# Patient Record
Sex: Female | Born: 1945
Health system: Southern US, Community
[De-identification: ages and names within clinical notes are randomized; demographics above are authoritative.]

## PROBLEM LIST (undated history)

## (undated) DIAGNOSIS — I10 Essential (primary) hypertension: Secondary | ICD-10-CM

## (undated) DIAGNOSIS — I5189 Other ill-defined heart diseases: Secondary | ICD-10-CM

## (undated) DIAGNOSIS — M545 Low back pain, unspecified: Secondary | ICD-10-CM

## (undated) DIAGNOSIS — F419 Anxiety disorder, unspecified: Secondary | ICD-10-CM

## (undated) DIAGNOSIS — C4490 Unspecified malignant neoplasm of skin, unspecified: Secondary | ICD-10-CM

## (undated) DIAGNOSIS — G8929 Other chronic pain: Secondary | ICD-10-CM

## (undated) DIAGNOSIS — D509 Iron deficiency anemia, unspecified: Secondary | ICD-10-CM

## (undated) DIAGNOSIS — C439 Malignant melanoma of skin, unspecified: Secondary | ICD-10-CM

## (undated) DIAGNOSIS — M7120 Synovial cyst of popliteal space [Baker], unspecified knee: Secondary | ICD-10-CM

## (undated) DIAGNOSIS — E119 Type 2 diabetes mellitus without complications: Secondary | ICD-10-CM

## (undated) DIAGNOSIS — Z800 Family history of malignant neoplasm of digestive organs: Secondary | ICD-10-CM

## (undated) DIAGNOSIS — I340 Nonrheumatic mitral (valve) insufficiency: Secondary | ICD-10-CM

## (undated) DIAGNOSIS — Z8049 Family history of malignant neoplasm of other genital organs: Secondary | ICD-10-CM

## (undated) DIAGNOSIS — E785 Hyperlipidemia, unspecified: Secondary | ICD-10-CM

## (undated) DIAGNOSIS — Z803 Family history of malignant neoplasm of breast: Secondary | ICD-10-CM

## (undated) DIAGNOSIS — I447 Left bundle-branch block, unspecified: Secondary | ICD-10-CM

## (undated) DIAGNOSIS — K219 Gastro-esophageal reflux disease without esophagitis: Secondary | ICD-10-CM

## (undated) DIAGNOSIS — Z808 Family history of malignant neoplasm of other organs or systems: Secondary | ICD-10-CM

## (undated) DIAGNOSIS — D649 Anemia, unspecified: Secondary | ICD-10-CM

## (undated) HISTORY — DX: Other chronic pain: G89.29

## (undated) HISTORY — DX: Synovial cyst of popliteal space (Baker), unspecified knee: M71.20

## (undated) HISTORY — DX: Family history of malignant neoplasm of other organs or systems: Z80.8

## (undated) HISTORY — DX: Hyperlipidemia, unspecified: E78.5

## (undated) HISTORY — PX: TONSILLECTOMY: SUR1361

## (undated) HISTORY — DX: Malignant melanoma of skin, unspecified: C43.9

## (undated) HISTORY — DX: Essential (primary) hypertension: I10

## (undated) HISTORY — PX: SKIN CANCER EXCISION: SHX779

## (undated) HISTORY — DX: Low back pain: M54.5

## (undated) HISTORY — DX: Nonrheumatic mitral (valve) insufficiency: I34.0

## (undated) HISTORY — DX: Type 2 diabetes mellitus without complications: E11.9

## (undated) HISTORY — DX: Left bundle-branch block, unspecified: I44.7

## (undated) HISTORY — DX: Family history of malignant neoplasm of digestive organs: Z80.0

## (undated) HISTORY — PX: ADENOIDECTOMY: SUR15

## (undated) HISTORY — DX: Unspecified malignant neoplasm of skin, unspecified: C44.90

## (undated) HISTORY — DX: Family history of malignant neoplasm of breast: Z80.3

## (undated) HISTORY — DX: Gastro-esophageal reflux disease without esophagitis: K21.9

## (undated) HISTORY — DX: Low back pain, unspecified: M54.50

## (undated) HISTORY — DX: Anxiety disorder, unspecified: F41.9

## (undated) HISTORY — DX: Other ill-defined heart diseases: I51.89

## (undated) HISTORY — DX: Iron deficiency anemia, unspecified: D50.9

## (undated) HISTORY — DX: Family history of malignant neoplasm of other genital organs: Z80.49

## (undated) HISTORY — PX: OTHER SURGICAL HISTORY: SHX169

## (undated) HISTORY — DX: Anemia, unspecified: D64.9

---

## 1999-03-03 ENCOUNTER — Inpatient Hospital Stay (HOSPITAL_COMMUNITY): Admission: EM | Admit: 1999-03-03 | Discharge: 1999-03-05 | Payer: Self-pay | Admitting: Cardiology

## 2007-04-13 HISTORY — PX: CHOLECYSTECTOMY: SHX55

## 2009-08-11 ENCOUNTER — Ambulatory Visit: Payer: Self-pay | Admitting: Cardiology

## 2009-11-18 ENCOUNTER — Emergency Department (HOSPITAL_COMMUNITY): Admission: EM | Admit: 2009-11-18 | Discharge: 2009-11-18 | Payer: Self-pay | Admitting: Emergency Medicine

## 2010-06-26 LAB — URINALYSIS, ROUTINE W REFLEX MICROSCOPIC
Bilirubin Urine: NEGATIVE
Hgb urine dipstick: NEGATIVE
Nitrite: NEGATIVE
Protein, ur: NEGATIVE mg/dL
Specific Gravity, Urine: >1.03 — ABNORMAL HIGH (ref 1.005–1.030)
Urobilinogen, UA: 0.2 mg/dL (ref 0.0–1.0)
pH: 5.5 (ref 5.0–8.0)

## 2010-08-28 NOTE — Discharge Summary (Signed)
[place]  Patient:    Kristina Berry                    MRN: [id number] Adm. Date:  [id number] Disc. Date: 03/05/99 Attending:  Lily Lovings Dictator:   Mikey Bussing, P.A. CC:         [facility], [address], [address], [place], [place]             Sandford Craze, M.D., [address] [place], [place] [address]             Denice Bors. Stanford Breed, M.D. [facility]             Romana Juniper, M.D., c/o [facility]             Consuello Masse, M.D., c/o [facility]                           Discharge Summary  HISTORY ON ADMISSION:  This is a 65 year old female who was admitted to [facility] in [place] on March 02, 1999 after she developed acute pulmonary edema  while having sexual relations with her husband.  The patient has a history of similar dyspnea in the past, which is felt to be related to asthma.  She had a -D echo February 20, 1999, read by Dr. Rayford Halsted.  This was a limited echo due to poor ultrasound transmission.  There was LVH with what appeared to be preserved left  ventricular systolic function, though the ejection fraction could not be estimated. There was also a question of mild aortic stenosis.  Arrangements were made to transfer the patient to [facility] for further cardiac evaluation.  It was felt that she might possibly have ischemic coronary artery disease resulting in  pulmonary edema.  PAST MEDICAL HISTORY:  The patient has a history of asthma and hypertension.  ALLERGIES:  SULFA.  SOCIAL HISTORY:  The patient is married.  She does not smoke.  She denies any history of tobacco use.  MEDICATIONS PRIOR TO ADMISSION: 1. Prempro. 2. Atenolol. 3. Monopril. 4. Zyrtec.  HOSPITAL COURSE:  As noted, this patient was transferred to [facility] for further evaluation of pulmonary edema.  She was seen by Dr. Stanford Breed at [facility].  She was also noted to  have a left bundle branch block on EKG.  On March 04, 1999, the patient underwent cardiac catheterization performed by Dr. Vicenta Aly.  The patient had normal coronary arteries.  There was no aortic stenosis.  The patient had normal to mildly decreased left ventricular function. There were increased right-sided pressures consistent with pulmonary hypertension.  The patient diuresed.  She was much improved on March 05, 1999.  Her blood pressure was under good control and arrangements were made to discharge the patient in improved condition.  LABORATORY DATA:  A BMET on the day of discharge revealed BUN 8, creatinine 0.5, potassium 3.6, glucose 132.  A hemoglobin A1C was pending to evaluate elevated glucose levels.  Cardiac enzymes were negative for an MI.  There are no other labs in the chart at this time.  Apparently, a chest x-ray was not performed, according to the patients history.  She did have a chest x-ray in the emergency room at [facility], although I do not have the results at this time.  DISCHARGE MEDICATIONS: 1. Altace 5 mg each day. 2. Lopressor 50 mg  1/2 tablet twice each day. 3. Lasix 20 mg each day. 4. K-Dur 10 mEq each day. 5. Prempro 1 each day as before. 6. Zyrtec as previously taken.  ACTIVITIES:  To be as tolerated.  The patient was told not to lift anything more than 10 pounds for at least one week.  She could gradually increase her activity and return to work as scheduled March 16, 1999 after her vacation.  DIET:  She was to be on a low salt/low fat diabetic diet.  DISCHARGE INSTRUCTIONS:  She was to call the office if she had any increased pain, swelling, or bleeding from her groin.  She was told to bring all of her medications to her next appointment with Dr. Stanford Breed and to take only the medications that are currently listed on this sheet.  She is to have a chemistry profile in one week at the [facility].  She is to have her blood  glucose evaluated by Dr. Tarri Glenn. She already has an appointment to see him next week.  She will see Dr. Stanford Breed in approximately two to four weeks.  She was told to call to schedule this appointment.  PROBLEM LIST AT TIME OF DISCHARGE: 1. Acute pulmonary edema, felt secondary to hypertension and diastolic dysfunction. 2. Cardiac catheterization March 04, 1999 revealing normal coronary arteries. 3. Left bundle branch block on EKG. 4. Hypertension. 5. Elevated glucose levels. 6. Elevated cholesterol. 7. 2-D echocardiogram performed ______ 10, 2000 revealing left ventricular    hypertrophy. 8. Elevated right-sided pressures consistent with pulmonary hypertension. DD:  03/05/99 TD:  03/05/99 Job: [id number]

## 2014-04-18 DIAGNOSIS — E782 Mixed hyperlipidemia: Secondary | ICD-10-CM | POA: Diagnosis not present

## 2014-04-18 DIAGNOSIS — E1165 Type 2 diabetes mellitus with hyperglycemia: Secondary | ICD-10-CM | POA: Diagnosis not present

## 2014-04-18 DIAGNOSIS — I10 Essential (primary) hypertension: Secondary | ICD-10-CM | POA: Diagnosis not present

## 2014-04-18 DIAGNOSIS — D649 Anemia, unspecified: Secondary | ICD-10-CM | POA: Diagnosis not present

## 2014-04-23 DIAGNOSIS — D649 Anemia, unspecified: Secondary | ICD-10-CM | POA: Diagnosis not present

## 2014-04-23 DIAGNOSIS — E782 Mixed hyperlipidemia: Secondary | ICD-10-CM | POA: Diagnosis not present

## 2014-04-23 DIAGNOSIS — F328 Other depressive episodes: Secondary | ICD-10-CM | POA: Diagnosis not present

## 2014-04-23 DIAGNOSIS — E1165 Type 2 diabetes mellitus with hyperglycemia: Secondary | ICD-10-CM | POA: Diagnosis not present

## 2014-05-01 DIAGNOSIS — Z01810 Encounter for preprocedural cardiovascular examination: Secondary | ICD-10-CM | POA: Diagnosis not present

## 2014-05-01 DIAGNOSIS — I340 Nonrheumatic mitral (valve) insufficiency: Secondary | ICD-10-CM | POA: Diagnosis not present

## 2014-05-01 DIAGNOSIS — R011 Cardiac murmur, unspecified: Secondary | ICD-10-CM | POA: Diagnosis not present

## 2014-05-01 DIAGNOSIS — E785 Hyperlipidemia, unspecified: Secondary | ICD-10-CM | POA: Diagnosis not present

## 2014-05-01 DIAGNOSIS — I10 Essential (primary) hypertension: Secondary | ICD-10-CM | POA: Diagnosis not present

## 2014-05-01 DIAGNOSIS — E119 Type 2 diabetes mellitus without complications: Secondary | ICD-10-CM | POA: Diagnosis not present

## 2014-05-13 HISTORY — PX: BACK SURGERY: SHX140

## 2014-05-21 DIAGNOSIS — Z1231 Encounter for screening mammogram for malignant neoplasm of breast: Secondary | ICD-10-CM | POA: Diagnosis not present

## 2014-05-27 DIAGNOSIS — S299XXA Unspecified injury of thorax, initial encounter: Secondary | ICD-10-CM | POA: Diagnosis not present

## 2014-05-30 DIAGNOSIS — Z6837 Body mass index (BMI) 37.0-37.9, adult: Secondary | ICD-10-CM | POA: Diagnosis not present

## 2014-05-30 DIAGNOSIS — M4716 Other spondylosis with myelopathy, lumbar region: Secondary | ICD-10-CM | POA: Diagnosis not present

## 2014-05-30 DIAGNOSIS — I509 Heart failure, unspecified: Secondary | ICD-10-CM | POA: Diagnosis not present

## 2014-05-30 DIAGNOSIS — E119 Type 2 diabetes mellitus without complications: Secondary | ICD-10-CM | POA: Diagnosis not present

## 2014-05-30 DIAGNOSIS — E109 Type 1 diabetes mellitus without complications: Secondary | ICD-10-CM | POA: Diagnosis not present

## 2014-05-30 DIAGNOSIS — M47816 Spondylosis without myelopathy or radiculopathy, lumbar region: Secondary | ICD-10-CM | POA: Diagnosis not present

## 2014-05-30 DIAGNOSIS — Z794 Long term (current) use of insulin: Secondary | ICD-10-CM | POA: Diagnosis not present

## 2014-05-30 DIAGNOSIS — M4317 Spondylolisthesis, lumbosacral region: Secondary | ICD-10-CM | POA: Diagnosis not present

## 2014-05-30 DIAGNOSIS — M4727 Other spondylosis with radiculopathy, lumbosacral region: Secondary | ICD-10-CM | POA: Diagnosis not present

## 2014-05-30 DIAGNOSIS — Z7982 Long term (current) use of aspirin: Secondary | ICD-10-CM | POA: Diagnosis not present

## 2014-05-30 DIAGNOSIS — Z85828 Personal history of other malignant neoplasm of skin: Secondary | ICD-10-CM | POA: Diagnosis not present

## 2014-05-30 DIAGNOSIS — Z888 Allergy status to other drugs, medicaments and biological substances status: Secondary | ICD-10-CM | POA: Diagnosis not present

## 2014-05-30 DIAGNOSIS — E669 Obesity, unspecified: Secondary | ICD-10-CM | POA: Diagnosis not present

## 2014-05-30 DIAGNOSIS — D649 Anemia, unspecified: Secondary | ICD-10-CM | POA: Diagnosis not present

## 2014-05-30 DIAGNOSIS — Z981 Arthrodesis status: Secondary | ICD-10-CM | POA: Diagnosis not present

## 2014-05-30 DIAGNOSIS — K219 Gastro-esophageal reflux disease without esophagitis: Secondary | ICD-10-CM | POA: Diagnosis not present

## 2014-05-30 DIAGNOSIS — M4316 Spondylolisthesis, lumbar region: Secondary | ICD-10-CM | POA: Diagnosis not present

## 2014-05-30 DIAGNOSIS — I10 Essential (primary) hypertension: Secondary | ICD-10-CM | POA: Diagnosis not present

## 2014-05-30 DIAGNOSIS — M4726 Other spondylosis with radiculopathy, lumbar region: Secondary | ICD-10-CM | POA: Diagnosis not present

## 2014-05-30 DIAGNOSIS — Z882 Allergy status to sulfonamides status: Secondary | ICD-10-CM | POA: Diagnosis not present

## 2014-05-30 DIAGNOSIS — Z79899 Other long term (current) drug therapy: Secondary | ICD-10-CM | POA: Diagnosis not present

## 2014-06-04 DIAGNOSIS — M4306 Spondylolysis, lumbar region: Secondary | ICD-10-CM | POA: Diagnosis not present

## 2014-06-07 DIAGNOSIS — Z981 Arthrodesis status: Secondary | ICD-10-CM | POA: Diagnosis not present

## 2014-06-07 DIAGNOSIS — M47816 Spondylosis without myelopathy or radiculopathy, lumbar region: Secondary | ICD-10-CM | POA: Diagnosis not present

## 2014-06-14 DIAGNOSIS — M79604 Pain in right leg: Secondary | ICD-10-CM | POA: Diagnosis not present

## 2014-06-14 DIAGNOSIS — M7989 Other specified soft tissue disorders: Secondary | ICD-10-CM | POA: Diagnosis not present

## 2014-06-14 DIAGNOSIS — M79661 Pain in right lower leg: Secondary | ICD-10-CM | POA: Diagnosis not present

## 2014-06-14 DIAGNOSIS — R938 Abnormal findings on diagnostic imaging of other specified body structures: Secondary | ICD-10-CM | POA: Diagnosis not present

## 2014-06-24 DIAGNOSIS — M47816 Spondylosis without myelopathy or radiculopathy, lumbar region: Secondary | ICD-10-CM | POA: Diagnosis not present

## 2014-06-24 DIAGNOSIS — M9903 Segmental and somatic dysfunction of lumbar region: Secondary | ICD-10-CM | POA: Diagnosis not present

## 2014-06-24 DIAGNOSIS — M5441 Lumbago with sciatica, right side: Secondary | ICD-10-CM | POA: Diagnosis not present

## 2014-06-26 DIAGNOSIS — M47816 Spondylosis without myelopathy or radiculopathy, lumbar region: Secondary | ICD-10-CM | POA: Diagnosis not present

## 2014-06-26 DIAGNOSIS — M5441 Lumbago with sciatica, right side: Secondary | ICD-10-CM | POA: Diagnosis not present

## 2014-06-26 DIAGNOSIS — M9903 Segmental and somatic dysfunction of lumbar region: Secondary | ICD-10-CM | POA: Diagnosis not present

## 2014-06-28 DIAGNOSIS — M5441 Lumbago with sciatica, right side: Secondary | ICD-10-CM | POA: Diagnosis not present

## 2014-06-28 DIAGNOSIS — M9903 Segmental and somatic dysfunction of lumbar region: Secondary | ICD-10-CM | POA: Diagnosis not present

## 2014-06-28 DIAGNOSIS — M47816 Spondylosis without myelopathy or radiculopathy, lumbar region: Secondary | ICD-10-CM | POA: Diagnosis not present

## 2014-07-01 DIAGNOSIS — M47816 Spondylosis without myelopathy or radiculopathy, lumbar region: Secondary | ICD-10-CM | POA: Diagnosis not present

## 2014-07-01 DIAGNOSIS — M9903 Segmental and somatic dysfunction of lumbar region: Secondary | ICD-10-CM | POA: Diagnosis not present

## 2014-07-01 DIAGNOSIS — M5441 Lumbago with sciatica, right side: Secondary | ICD-10-CM | POA: Diagnosis not present

## 2014-07-03 DIAGNOSIS — M9903 Segmental and somatic dysfunction of lumbar region: Secondary | ICD-10-CM | POA: Diagnosis not present

## 2014-07-03 DIAGNOSIS — M5441 Lumbago with sciatica, right side: Secondary | ICD-10-CM | POA: Diagnosis not present

## 2014-07-03 DIAGNOSIS — M47816 Spondylosis without myelopathy or radiculopathy, lumbar region: Secondary | ICD-10-CM | POA: Diagnosis not present

## 2014-07-08 DIAGNOSIS — M47816 Spondylosis without myelopathy or radiculopathy, lumbar region: Secondary | ICD-10-CM | POA: Diagnosis not present

## 2014-07-08 DIAGNOSIS — M9903 Segmental and somatic dysfunction of lumbar region: Secondary | ICD-10-CM | POA: Diagnosis not present

## 2014-07-08 DIAGNOSIS — M5441 Lumbago with sciatica, right side: Secondary | ICD-10-CM | POA: Diagnosis not present

## 2014-07-08 DIAGNOSIS — M5136 Other intervertebral disc degeneration, lumbar region: Secondary | ICD-10-CM | POA: Diagnosis not present

## 2014-07-10 DIAGNOSIS — M5136 Other intervertebral disc degeneration, lumbar region: Secondary | ICD-10-CM | POA: Diagnosis not present

## 2014-07-10 DIAGNOSIS — M5441 Lumbago with sciatica, right side: Secondary | ICD-10-CM | POA: Diagnosis not present

## 2014-07-10 DIAGNOSIS — M47816 Spondylosis without myelopathy or radiculopathy, lumbar region: Secondary | ICD-10-CM | POA: Diagnosis not present

## 2014-07-10 DIAGNOSIS — M9903 Segmental and somatic dysfunction of lumbar region: Secondary | ICD-10-CM | POA: Diagnosis not present

## 2014-07-15 DIAGNOSIS — M9903 Segmental and somatic dysfunction of lumbar region: Secondary | ICD-10-CM | POA: Diagnosis not present

## 2014-07-15 DIAGNOSIS — M5441 Lumbago with sciatica, right side: Secondary | ICD-10-CM | POA: Diagnosis not present

## 2014-07-15 DIAGNOSIS — M47816 Spondylosis without myelopathy or radiculopathy, lumbar region: Secondary | ICD-10-CM | POA: Diagnosis not present

## 2014-07-15 DIAGNOSIS — M5136 Other intervertebral disc degeneration, lumbar region: Secondary | ICD-10-CM | POA: Diagnosis not present

## 2014-07-16 DIAGNOSIS — K219 Gastro-esophageal reflux disease without esophagitis: Secondary | ICD-10-CM | POA: Diagnosis not present

## 2014-07-16 DIAGNOSIS — E782 Mixed hyperlipidemia: Secondary | ICD-10-CM | POA: Diagnosis not present

## 2014-07-16 DIAGNOSIS — E1165 Type 2 diabetes mellitus with hyperglycemia: Secondary | ICD-10-CM | POA: Diagnosis not present

## 2014-07-16 DIAGNOSIS — I10 Essential (primary) hypertension: Secondary | ICD-10-CM | POA: Diagnosis not present

## 2014-07-18 DIAGNOSIS — M5136 Other intervertebral disc degeneration, lumbar region: Secondary | ICD-10-CM | POA: Diagnosis not present

## 2014-07-18 DIAGNOSIS — M9903 Segmental and somatic dysfunction of lumbar region: Secondary | ICD-10-CM | POA: Diagnosis not present

## 2014-07-18 DIAGNOSIS — M47816 Spondylosis without myelopathy or radiculopathy, lumbar region: Secondary | ICD-10-CM | POA: Diagnosis not present

## 2014-07-18 DIAGNOSIS — M5441 Lumbago with sciatica, right side: Secondary | ICD-10-CM | POA: Diagnosis not present

## 2014-07-22 DIAGNOSIS — M5441 Lumbago with sciatica, right side: Secondary | ICD-10-CM | POA: Diagnosis not present

## 2014-07-22 DIAGNOSIS — M9903 Segmental and somatic dysfunction of lumbar region: Secondary | ICD-10-CM | POA: Diagnosis not present

## 2014-07-22 DIAGNOSIS — M47816 Spondylosis without myelopathy or radiculopathy, lumbar region: Secondary | ICD-10-CM | POA: Diagnosis not present

## 2014-07-22 DIAGNOSIS — M5136 Other intervertebral disc degeneration, lumbar region: Secondary | ICD-10-CM | POA: Diagnosis not present

## 2014-07-23 DIAGNOSIS — E1165 Type 2 diabetes mellitus with hyperglycemia: Secondary | ICD-10-CM | POA: Diagnosis not present

## 2014-07-23 DIAGNOSIS — D649 Anemia, unspecified: Secondary | ICD-10-CM | POA: Diagnosis not present

## 2014-07-23 DIAGNOSIS — F328 Other depressive episodes: Secondary | ICD-10-CM | POA: Diagnosis not present

## 2014-07-23 DIAGNOSIS — I10 Essential (primary) hypertension: Secondary | ICD-10-CM | POA: Diagnosis not present

## 2014-07-23 DIAGNOSIS — E782 Mixed hyperlipidemia: Secondary | ICD-10-CM | POA: Diagnosis not present

## 2014-07-23 DIAGNOSIS — Z1389 Encounter for screening for other disorder: Secondary | ICD-10-CM | POA: Diagnosis not present

## 2014-07-26 DIAGNOSIS — M5136 Other intervertebral disc degeneration, lumbar region: Secondary | ICD-10-CM | POA: Diagnosis not present

## 2014-07-26 DIAGNOSIS — M9903 Segmental and somatic dysfunction of lumbar region: Secondary | ICD-10-CM | POA: Diagnosis not present

## 2014-07-26 DIAGNOSIS — M47816 Spondylosis without myelopathy or radiculopathy, lumbar region: Secondary | ICD-10-CM | POA: Diagnosis not present

## 2014-07-26 DIAGNOSIS — M5441 Lumbago with sciatica, right side: Secondary | ICD-10-CM | POA: Diagnosis not present

## 2014-08-02 DIAGNOSIS — M5441 Lumbago with sciatica, right side: Secondary | ICD-10-CM | POA: Diagnosis not present

## 2014-08-02 DIAGNOSIS — M9903 Segmental and somatic dysfunction of lumbar region: Secondary | ICD-10-CM | POA: Diagnosis not present

## 2014-08-02 DIAGNOSIS — M47816 Spondylosis without myelopathy or radiculopathy, lumbar region: Secondary | ICD-10-CM | POA: Diagnosis not present

## 2014-08-02 DIAGNOSIS — M5136 Other intervertebral disc degeneration, lumbar region: Secondary | ICD-10-CM | POA: Diagnosis not present

## 2014-08-06 DIAGNOSIS — I10 Essential (primary) hypertension: Secondary | ICD-10-CM | POA: Diagnosis not present

## 2014-08-06 DIAGNOSIS — I5032 Chronic diastolic (congestive) heart failure: Secondary | ICD-10-CM | POA: Diagnosis not present

## 2014-08-06 DIAGNOSIS — R600 Localized edema: Secondary | ICD-10-CM | POA: Diagnosis not present

## 2014-08-09 DIAGNOSIS — M9903 Segmental and somatic dysfunction of lumbar region: Secondary | ICD-10-CM | POA: Diagnosis not present

## 2014-08-09 DIAGNOSIS — M47816 Spondylosis without myelopathy or radiculopathy, lumbar region: Secondary | ICD-10-CM | POA: Diagnosis not present

## 2014-08-09 DIAGNOSIS — M5136 Other intervertebral disc degeneration, lumbar region: Secondary | ICD-10-CM | POA: Diagnosis not present

## 2014-08-09 DIAGNOSIS — M5441 Lumbago with sciatica, right side: Secondary | ICD-10-CM | POA: Diagnosis not present

## 2014-08-16 DIAGNOSIS — M47816 Spondylosis without myelopathy or radiculopathy, lumbar region: Secondary | ICD-10-CM | POA: Diagnosis not present

## 2014-08-16 DIAGNOSIS — M5441 Lumbago with sciatica, right side: Secondary | ICD-10-CM | POA: Diagnosis not present

## 2014-08-16 DIAGNOSIS — M5136 Other intervertebral disc degeneration, lumbar region: Secondary | ICD-10-CM | POA: Diagnosis not present

## 2014-08-16 DIAGNOSIS — M9903 Segmental and somatic dysfunction of lumbar region: Secondary | ICD-10-CM | POA: Diagnosis not present

## 2014-09-06 DIAGNOSIS — M1991 Primary osteoarthritis, unspecified site: Secondary | ICD-10-CM | POA: Diagnosis not present

## 2014-09-06 DIAGNOSIS — M7121 Synovial cyst of popliteal space [Baker], right knee: Secondary | ICD-10-CM | POA: Diagnosis not present

## 2014-09-06 DIAGNOSIS — R600 Localized edema: Secondary | ICD-10-CM | POA: Diagnosis not present

## 2014-09-06 DIAGNOSIS — I5032 Chronic diastolic (congestive) heart failure: Secondary | ICD-10-CM | POA: Diagnosis not present

## 2014-09-06 DIAGNOSIS — M545 Low back pain: Secondary | ICD-10-CM | POA: Diagnosis not present

## 2014-10-08 DIAGNOSIS — E782 Mixed hyperlipidemia: Secondary | ICD-10-CM | POA: Diagnosis not present

## 2014-10-08 DIAGNOSIS — D649 Anemia, unspecified: Secondary | ICD-10-CM | POA: Diagnosis not present

## 2014-10-08 DIAGNOSIS — E1165 Type 2 diabetes mellitus with hyperglycemia: Secondary | ICD-10-CM | POA: Diagnosis not present

## 2014-10-08 DIAGNOSIS — I10 Essential (primary) hypertension: Secondary | ICD-10-CM | POA: Diagnosis not present

## 2014-10-23 DIAGNOSIS — I10 Essential (primary) hypertension: Secondary | ICD-10-CM | POA: Diagnosis not present

## 2014-10-23 DIAGNOSIS — E1165 Type 2 diabetes mellitus with hyperglycemia: Secondary | ICD-10-CM | POA: Diagnosis not present

## 2014-10-23 DIAGNOSIS — F328 Other depressive episodes: Secondary | ICD-10-CM | POA: Diagnosis not present

## 2014-10-23 DIAGNOSIS — E782 Mixed hyperlipidemia: Secondary | ICD-10-CM | POA: Diagnosis not present

## 2014-10-23 DIAGNOSIS — D649 Anemia, unspecified: Secondary | ICD-10-CM | POA: Diagnosis not present

## 2014-12-05 DIAGNOSIS — M47816 Spondylosis without myelopathy or radiculopathy, lumbar region: Secondary | ICD-10-CM | POA: Diagnosis not present

## 2014-12-05 DIAGNOSIS — M4316 Spondylolisthesis, lumbar region: Secondary | ICD-10-CM | POA: Diagnosis not present

## 2014-12-05 DIAGNOSIS — M4806 Spinal stenosis, lumbar region: Secondary | ICD-10-CM | POA: Diagnosis not present

## 2015-01-24 DIAGNOSIS — E1165 Type 2 diabetes mellitus with hyperglycemia: Secondary | ICD-10-CM | POA: Diagnosis not present

## 2015-01-24 DIAGNOSIS — Z23 Encounter for immunization: Secondary | ICD-10-CM | POA: Diagnosis not present

## 2015-01-24 DIAGNOSIS — I10 Essential (primary) hypertension: Secondary | ICD-10-CM | POA: Diagnosis not present

## 2015-01-24 DIAGNOSIS — D649 Anemia, unspecified: Secondary | ICD-10-CM | POA: Diagnosis not present

## 2015-01-24 DIAGNOSIS — E782 Mixed hyperlipidemia: Secondary | ICD-10-CM | POA: Diagnosis not present

## 2015-03-03 DIAGNOSIS — K219 Gastro-esophageal reflux disease without esophagitis: Secondary | ICD-10-CM | POA: Diagnosis not present

## 2015-03-03 DIAGNOSIS — R05 Cough: Secondary | ICD-10-CM | POA: Diagnosis not present

## 2015-03-03 DIAGNOSIS — J4521 Mild intermittent asthma with (acute) exacerbation: Secondary | ICD-10-CM | POA: Diagnosis not present

## 2015-04-01 DIAGNOSIS — J209 Acute bronchitis, unspecified: Secondary | ICD-10-CM | POA: Diagnosis not present

## 2015-04-01 DIAGNOSIS — J0101 Acute recurrent maxillary sinusitis: Secondary | ICD-10-CM | POA: Diagnosis not present

## 2015-04-10 DIAGNOSIS — I5032 Chronic diastolic (congestive) heart failure: Secondary | ICD-10-CM | POA: Diagnosis not present

## 2015-04-10 DIAGNOSIS — E1165 Type 2 diabetes mellitus with hyperglycemia: Secondary | ICD-10-CM | POA: Diagnosis not present

## 2015-04-10 DIAGNOSIS — I10 Essential (primary) hypertension: Secondary | ICD-10-CM | POA: Diagnosis not present

## 2015-04-10 DIAGNOSIS — D649 Anemia, unspecified: Secondary | ICD-10-CM | POA: Diagnosis not present

## 2015-04-10 DIAGNOSIS — E782 Mixed hyperlipidemia: Secondary | ICD-10-CM | POA: Diagnosis not present

## 2015-04-18 DIAGNOSIS — I340 Nonrheumatic mitral (valve) insufficiency: Secondary | ICD-10-CM | POA: Diagnosis not present

## 2015-04-18 DIAGNOSIS — E782 Mixed hyperlipidemia: Secondary | ICD-10-CM | POA: Diagnosis not present

## 2015-04-18 DIAGNOSIS — E1165 Type 2 diabetes mellitus with hyperglycemia: Secondary | ICD-10-CM | POA: Diagnosis not present

## 2015-04-18 DIAGNOSIS — I10 Essential (primary) hypertension: Secondary | ICD-10-CM | POA: Diagnosis not present

## 2015-04-18 DIAGNOSIS — D649 Anemia, unspecified: Secondary | ICD-10-CM | POA: Diagnosis not present

## 2015-05-26 DIAGNOSIS — Z1231 Encounter for screening mammogram for malignant neoplasm of breast: Secondary | ICD-10-CM | POA: Diagnosis not present

## 2015-05-26 DIAGNOSIS — R921 Mammographic calcification found on diagnostic imaging of breast: Secondary | ICD-10-CM | POA: Diagnosis not present

## 2015-06-03 DIAGNOSIS — M47816 Spondylosis without myelopathy or radiculopathy, lumbar region: Secondary | ICD-10-CM | POA: Diagnosis not present

## 2015-06-04 ENCOUNTER — Other Ambulatory Visit: Payer: Self-pay | Admitting: Family Medicine

## 2015-06-04 DIAGNOSIS — R921 Mammographic calcification found on diagnostic imaging of breast: Secondary | ICD-10-CM | POA: Diagnosis not present

## 2015-06-12 ENCOUNTER — Ambulatory Visit
Admission: RE | Admit: 2015-06-12 | Discharge: 2015-06-12 | Disposition: A | Discharge status: Home / Self Care | Payer: Medicare Other | Source: Ambulatory Visit | Attending: Family Medicine | Admitting: Family Medicine

## 2015-06-12 DIAGNOSIS — N6012 Diffuse cystic mastopathy of left breast: Secondary | ICD-10-CM | POA: Diagnosis not present

## 2015-06-12 DIAGNOSIS — R921 Mammographic calcification found on diagnostic imaging of breast: Secondary | ICD-10-CM

## 2015-06-24 DIAGNOSIS — N62 Hypertrophy of breast: Secondary | ICD-10-CM | POA: Diagnosis not present

## 2015-07-22 DIAGNOSIS — E782 Mixed hyperlipidemia: Secondary | ICD-10-CM | POA: Diagnosis not present

## 2015-07-22 DIAGNOSIS — K219 Gastro-esophageal reflux disease without esophagitis: Secondary | ICD-10-CM | POA: Diagnosis not present

## 2015-07-22 DIAGNOSIS — I10 Essential (primary) hypertension: Secondary | ICD-10-CM | POA: Diagnosis not present

## 2015-07-22 DIAGNOSIS — M1991 Primary osteoarthritis, unspecified site: Secondary | ICD-10-CM | POA: Diagnosis not present

## 2015-07-22 DIAGNOSIS — E1165 Type 2 diabetes mellitus with hyperglycemia: Secondary | ICD-10-CM | POA: Diagnosis not present

## 2015-07-30 DIAGNOSIS — K219 Gastro-esophageal reflux disease without esophagitis: Secondary | ICD-10-CM | POA: Diagnosis not present

## 2015-07-30 DIAGNOSIS — Z1389 Encounter for screening for other disorder: Secondary | ICD-10-CM | POA: Diagnosis not present

## 2015-07-30 DIAGNOSIS — I5032 Chronic diastolic (congestive) heart failure: Secondary | ICD-10-CM | POA: Diagnosis not present

## 2015-07-30 DIAGNOSIS — E1165 Type 2 diabetes mellitus with hyperglycemia: Secondary | ICD-10-CM | POA: Diagnosis not present

## 2015-07-30 DIAGNOSIS — E782 Mixed hyperlipidemia: Secondary | ICD-10-CM | POA: Diagnosis not present

## 2015-07-30 DIAGNOSIS — I10 Essential (primary) hypertension: Secondary | ICD-10-CM | POA: Diagnosis not present

## 2015-09-25 DIAGNOSIS — E119 Type 2 diabetes mellitus without complications: Secondary | ICD-10-CM | POA: Diagnosis not present

## 2015-09-25 DIAGNOSIS — H2513 Age-related nuclear cataract, bilateral: Secondary | ICD-10-CM | POA: Diagnosis not present

## 2015-10-02 DIAGNOSIS — R002 Palpitations: Secondary | ICD-10-CM | POA: Diagnosis not present

## 2015-10-02 DIAGNOSIS — I447 Left bundle-branch block, unspecified: Secondary | ICD-10-CM | POA: Diagnosis not present

## 2015-10-02 DIAGNOSIS — I10 Essential (primary) hypertension: Secondary | ICD-10-CM | POA: Diagnosis not present

## 2015-10-02 DIAGNOSIS — I340 Nonrheumatic mitral (valve) insufficiency: Secondary | ICD-10-CM | POA: Diagnosis not present

## 2015-10-02 DIAGNOSIS — E1165 Type 2 diabetes mellitus with hyperglycemia: Secondary | ICD-10-CM | POA: Diagnosis not present

## 2015-10-17 ENCOUNTER — Ambulatory Visit: Payer: Medicare Other | Admitting: Cardiology

## 2015-10-24 ENCOUNTER — Ambulatory Visit (INDEPENDENT_AMBULATORY_CARE_PROVIDER_SITE_OTHER): Payer: Medicare Other | Admitting: Cardiology

## 2015-10-24 ENCOUNTER — Ambulatory Visit (INDEPENDENT_AMBULATORY_CARE_PROVIDER_SITE_OTHER): Payer: Medicare Other

## 2015-10-24 ENCOUNTER — Encounter: Payer: Self-pay | Admitting: *Deleted

## 2015-10-24 ENCOUNTER — Other Ambulatory Visit: Payer: Self-pay | Admitting: *Deleted

## 2015-10-24 VITALS — BP 130/64 | HR 84 | Ht 62.0 in | Wt 207.0 lb

## 2015-10-24 DIAGNOSIS — R002 Palpitations: Secondary | ICD-10-CM

## 2015-10-24 DIAGNOSIS — I10 Essential (primary) hypertension: Secondary | ICD-10-CM

## 2015-10-24 DIAGNOSIS — I447 Left bundle-branch block, unspecified: Secondary | ICD-10-CM

## 2015-10-24 DIAGNOSIS — R011 Cardiac murmur, unspecified: Secondary | ICD-10-CM

## 2015-10-24 DIAGNOSIS — I519 Heart disease, unspecified: Secondary | ICD-10-CM

## 2015-10-24 DIAGNOSIS — I5189 Other ill-defined heart diseases: Secondary | ICD-10-CM

## 2015-10-24 NOTE — Progress Notes (Signed)
Cardiology Office Note  Date: 10/24/2015   ID: Kristina Berry, DOB 26-Oct-1945, MRN [id number]  PCP: Curlene Labrum, MD  Consulting Cardiologist: Rozann Lesches, MD   Chief Complaint  Patient presents with  . Palpitations    History of Present Illness: Kristina Berry is a 70 y.o. female referred for cardiology consultation by Dr. Pleas Koch.She presents with a 4-6 week history of intermittent palpitations described as a "skipped beat." She notices this occurring in spells, sometimes very frequent, and then she may go a few days without having any symptoms. She cannot identify any specific trigger other than the fact that she has been under more stress recently. She has no chest pain or breathlessness associated with this, has had no syncope.  She underwent previous cardiac testing in January 2016 prior to undergoing back surgery, echocardiogram and Myoview studies are summarized below, read by Dr. Hamilton Capri at [facility]. There was some discrepancy in LVEF between these 2 studies, but it sounds like it was overall normal range with septal abnormality consistent with left bundle branch block.  I reviewed her medications. She has been on diltiazem long-term for management of diastolic dysfunction. Degree of mitral regurgitation as of 2011 was mild to moderate, not described as significant by her most recent study.  States that she takes care of her ADLs. She is married, her mother also lives in their home and she cares for her needs. She reports NYHA class II dyspnea.  Past Medical History  Diagnosis Date  . LBBB (left bundle branch block)   . Type 2 diabetes mellitus ([place])   . Essential hypertension   . Diastolic dysfunction   . GERD (gastroesophageal reflux disease)   . Hyperlipidemia   . Morbid obesity ([place])   . Chronic low back pain   . Baker's cyst of knee   . Mitral regurgitation     Mild to moderate 2011    Past Surgical History  Procedure Laterality Date  .  Adenoidectomy    . Back surgery  05/2014  . Cholecystectomy  2009  . Melanoma surgery Right   . Skin cancer excision      Basal cell removal from left side of face  . Tonsillectomy      Current Outpatient Prescriptions  Medication Sig Dispense Refill  . albuterol (PROAIR HFA) 108 (90 Base) MCG/ACT inhaler Inhale 2 puffs into the lungs every 4 (four) hours as needed.    Marland Kitchen aspirin 81 MG tablet Take 1 tablet by mouth daily.    Marland Kitchen atorvastatin (LIPITOR) 20 MG tablet Take 1 tablet by mouth daily.    Marland Kitchen diltiazem (DILACOR XR) 180 MG 24 hr capsule Take 1 capsule by mouth daily.    . furosemide (LASIX) 40 MG tablet Take 40 mg by mouth 2 (two) times daily.     . insulin glargine (LANTUS) 100 UNIT/ML injection Inject 20 Units into the skin at bedtime.    . irbesartan (AVAPRO) 150 MG tablet Take 1 tablet by mouth daily.    Marland Kitchen ketoconazole (NIZORAL) 2 % cream Apply 1 application topically 2 (two) times daily as needed.    . metFORMIN (GLUCOPHAGE) 1000 MG tablet Take 1 tablet by mouth 2 (two) times daily.    Marland Kitchen omeprazole (PRILOSEC) 20 MG capsule Take 1 capsule by mouth daily.    Marland Kitchen PARoxetine (PAXIL) 10 MG tablet Take 0.5 tablets by mouth daily.    . sitaGLIPtin (JANUVIA) 100 MG tablet Take 1 tablet by mouth daily.  No current facility-administered medications for this visit.   Allergies:  Cefdinir; Nsaids; and Sulfa antibiotics   Social History: The patient  reports that she has never smoked. She has never used smokeless tobacco. She reports that she does not drink alcohol or use illicit drugs.   Family History: The patient's family history includes Breast cancer in her sister; Dementia in her mother; Osteoporosis in her mother.   ROS:  Please see the history of present illness. Otherwise, complete review of systems is positive for arthritic pains and back pain.  All other systems are reviewed and negative.   Physical Exam: VS:  BP 130/64 mmHg  Pulse 84  Ht 5\' 2"  (1.575 m)  Wt 207 lb (93.895  kg)  BMI 37.85 kg/m2  SpO2 97%, BMI Body mass index is 37.85 kg/(m^2).  Wt Readings from Last 3 Encounters:  10/24/15 207 lb (93.895 kg)    General: Obese woman, appears comfortable at rest. HEENT: Conjunctiva and lids normal, oropharynx clear. Neck: Supple, no elevated JVP or carotid bruits, no thyromegaly. Lungs: Clear to auscultation, nonlabored breathing at rest. Cardiac: Regular rate and rhythm, no S3, [id number] systolic murmur at base, no pericardial rub. Abdomen: Soft, nontender, bowel sounds present, no guarding or rebound. Extremities: Chronic appearing edema and stasis of the lower legs, distal pulses 2+. Skin: Warm and dry. Musculoskeletal: No kyphosis. Neuropsychiatric: Alert and oriented x3, affect grossly appropriate.  ECG: I personally reviewed the tracing from 10/02/2015 which showed sinus rhythm with left bundle branch block.  Other Studies Reviewed Today:  Echocardiogram 05/01/2014 [facility]): Upper normal LV wall thickness with LVEF 55-60%, abnormal septal motion in the setting of left bundle branch block, abnormal diastolic function (ungraded), no significant mitral regurgitation, RVSP calculated at 38 mmHg.   Lexiscan Myoview 05/01/2014 [facility]): No ischemic defects reported, LVEF 48%.   Assessment and Plan:  1. Palpitations as outlined above, possibly PVCs based on description. She has not had syncope. Symptoms have been occurring and spells over the last 4-6 weeks. She is already on diltiazem for treatment of diastolic dysfunction. LVEF overall normal range as of a year and half ago. She has left bundle branch block by ECG. Plan is to follow-up with a 48-hour Holter monitor.  2. Cardiac murmur, mostly in the aortic position although mitral regurgitation reported based on prior records. We will plan a follow-up echocardiogram to quantify cardiac structure and function.  3. Essential hypertension, blood pressure is reasonable well controlled today. She is on Dilacor  XR, Avapro, and Lasix.  4. Left bundle branch block by ECG, no obvious history of ischemic heart disease. She had no ischemic defects by Myoview in January 2016.  Current medicines were reviewed with the patient today.   Orders Placed This Encounter  Procedures  . Holter monitor - 48 hour  . ECHOCARDIOGRAM COMPLETE    Disposition: Call with results.  Signed, Satira Sark, MD, [facility] 10/24/2015 8:39 AM    [place] at [place], [place], [place] [address] Phone: [phone]; Fax: [phone]

## 2015-10-24 NOTE — Patient Instructions (Signed)
Medication Instructions:  Your physician recommends that you continue on your current medications as directed. Please refer to the Current Medication list given to you today.  Labwork: NONE  Testing/Procedures: Your physician has requested that you have an echocardiogram. Echocardiography is a painless test that uses sound waves to create images of your heart. It provides your doctor with information about the size and shape of your heart and how well your heart's chambers and valves are working. This procedure takes approximately one hour. There are no restrictions for this procedure. Your physician has recommended that you wear a 48 hour holter monitor. Holter monitors are medical devices that record the heart's electrical activity. Doctors most often use these monitors to diagnose arrhythmias. Arrhythmias are problems with the speed or rhythm of the heartbeat. The monitor is a small, portable device. You can wear one while you do your normal daily activities. This is usually used to diagnose what is causing palpitations/syncope (passing out).  Follow-Up:  Pending results. We will call you with your results.  Any Other Special Instructions Will Be Listed Below (If Applicable).  If you need a refill on your cardiac medications before your next appointment, please call your pharmacy.

## 2015-10-30 ENCOUNTER — Ambulatory Visit: Payer: Medicare Other | Admitting: Cardiovascular Disease

## 2015-11-03 ENCOUNTER — Ambulatory Visit: Payer: Medicare Other | Admitting: Cardiology

## 2015-11-05 ENCOUNTER — Other Ambulatory Visit: Payer: Self-pay

## 2015-11-05 ENCOUNTER — Ambulatory Visit (INDEPENDENT_AMBULATORY_CARE_PROVIDER_SITE_OTHER): Payer: Medicare Other

## 2015-11-05 DIAGNOSIS — R011 Cardiac murmur, unspecified: Secondary | ICD-10-CM | POA: Diagnosis not present

## 2015-11-05 DIAGNOSIS — R002 Palpitations: Secondary | ICD-10-CM

## 2015-11-05 LAB — ECHOCARDIOGRAM COMPLETE
CHL CUP DOP CALC LVOT VTI: 32.9 cm
CHL CUP STROKE VOLUME: 61 mL
E decel time: 193 msec
E/e' ratio: 13.5
FS: -11 % — AB (ref 28–44)
IV/PV OW: 0.78
LA ID, A-P, ES: 41 mm
LA diam index: 2.11 cm/m2
LA vol A4C: 65.4 ml
LAVOL: 63.8 mL
LAVOLIN: 32.9 mL/m2
LDCA: 3.46 cm2
LEFT ATRIUM END SYS DIAM: 41 mm
LV E/e'average: 13.5
LV TDI E'LATERAL: 8.22
LV e' LATERAL: 8.22 cm/s
LV sys vol index: 25 mL/m2
LV sys vol: 49 mL — AB (ref 14–42)
LVDIAVOL: 110 mL — AB (ref 46–106)
LVDIAVOLIN: 57 mL/m2
LVEEMED: 13.5
LVOT peak grad rest: 8 mmHg
LVOTD: 21 mm
LVOTPV: 140 cm/s
LVOTSV: 114 mL
MV Dec: 193
MV pk E vel: 111 m/s
MVPG: 5 mmHg
MVPKAVEL: 100 m/s
PW: 11.3 mm — AB (ref 0.6–1.1)
Simpson's disk: 55
TAPSE: 21.4 mm
TDI e' medial: 7.74

## 2015-11-06 ENCOUNTER — Telehealth: Payer: Self-pay | Admitting: *Deleted

## 2015-11-06 NOTE — Telephone Encounter (Signed)
-----   Message from Satira Sark, MD sent at 11/05/2015  1:28 PM EDT ----- Results reviewed. LVEF low normal range, consistent with most recent testing. Mild mitral regurgitation, mildly calcified aortic annulus, no major valvular abnormalities. We will follow-up with cardiac monitor results. A copy of this test should be forwarded to Curlene Labrum, MD.

## 2015-11-06 NOTE — Telephone Encounter (Signed)
Patient informed and copy sent to PCP. 

## 2015-11-07 ENCOUNTER — Telehealth: Payer: Self-pay | Admitting: *Deleted

## 2015-11-07 MED ORDER — DILTIAZEM HCL ER COATED BEADS 240 MG PO CP24: 240.0000 mg | ORAL_CAPSULE | Freq: Every day | ORAL | 3 refills | Status: DC

## 2015-11-07 NOTE — Telephone Encounter (Signed)
-----   Message from Satira Sark, MD sent at 11/07/2015 11:25 AM EDT ----- Results reviewed. Heart rhythm normal, but she does have some ectopic beats, most notably PACs and some brief runs of PSVT which she may be experiencing as palpitations. She is already on diltiazem which is a good choice, consider increasing dose to 240 mg daily. Watch blood pressure. We can see her back in about 6 months for reevaluation, sooner if needed. A copy of this test should be forwarded to Curlene Labrum, MD.

## 2015-11-07 NOTE — Telephone Encounter (Signed)
Patient informed and verbalized understanding of plan. Copy sent to PCP 

## 2015-12-02 DIAGNOSIS — M47816 Spondylosis without myelopathy or radiculopathy, lumbar region: Secondary | ICD-10-CM | POA: Diagnosis not present

## 2015-12-02 DIAGNOSIS — M4806 Spinal stenosis, lumbar region: Secondary | ICD-10-CM | POA: Diagnosis not present

## 2015-12-02 DIAGNOSIS — M4316 Spondylolisthesis, lumbar region: Secondary | ICD-10-CM | POA: Diagnosis not present

## 2015-12-05 ENCOUNTER — Telehealth: Payer: Self-pay | Admitting: Cardiology

## 2015-12-05 NOTE — Telephone Encounter (Signed)
Patient is questioning if she should change to brand name on her Diltiazem.  Stated she has had issues with generics in the past.  Last night had episode of heart skipping beats.  As soon as she layed down, she noticed the skipping.  Had to get up & sit in chair (recliner) the rest of the night.  No chest pain or SOB.  Does not feel like beating too fast, just skipping.  Been doing it some today, but not all the time.  Advised to go to ED / call 911 for evaluation if symptoms worsen over the weekend or she feels like she may pass out.  Patient verbalized understanding.  Message sent to provider for further advice.

## 2015-12-05 NOTE — Telephone Encounter (Signed)
Patient has questions about the medication  diltiazem (CARDIZEM CD) 240 MG 24 hr capsule

## 2015-12-08 NOTE — Telephone Encounter (Signed)
Based on her monitor results I would try to stick with the diltiazem. It is okay with me if she uses a brand name diltiazem CD 240 mg daily, although the cost may be quite a bit higher depending on her pharmacy and insurance.

## 2015-12-09 NOTE — Telephone Encounter (Signed)
Patient notified.  Stated that she has not had another episode since speaking with [place].  Advised to continue everything the same for now & to call back if have any future problems.  She verbalized understanding.

## 2015-12-22 DIAGNOSIS — D225 Melanocytic nevi of trunk: Secondary | ICD-10-CM | POA: Diagnosis not present

## 2015-12-22 DIAGNOSIS — L258 Unspecified contact dermatitis due to other agents: Secondary | ICD-10-CM | POA: Diagnosis not present

## 2016-01-26 DIAGNOSIS — I10 Essential (primary) hypertension: Secondary | ICD-10-CM | POA: Diagnosis not present

## 2016-01-26 DIAGNOSIS — E782 Mixed hyperlipidemia: Secondary | ICD-10-CM | POA: Diagnosis not present

## 2016-01-26 DIAGNOSIS — D649 Anemia, unspecified: Secondary | ICD-10-CM | POA: Diagnosis not present

## 2016-01-26 DIAGNOSIS — E1165 Type 2 diabetes mellitus with hyperglycemia: Secondary | ICD-10-CM | POA: Diagnosis not present

## 2016-01-26 DIAGNOSIS — K219 Gastro-esophageal reflux disease without esophagitis: Secondary | ICD-10-CM | POA: Diagnosis not present

## 2016-01-29 DIAGNOSIS — I5032 Chronic diastolic (congestive) heart failure: Secondary | ICD-10-CM | POA: Diagnosis not present

## 2016-01-29 DIAGNOSIS — E1165 Type 2 diabetes mellitus with hyperglycemia: Secondary | ICD-10-CM | POA: Diagnosis not present

## 2016-01-29 DIAGNOSIS — I447 Left bundle-branch block, unspecified: Secondary | ICD-10-CM | POA: Diagnosis not present

## 2016-01-29 DIAGNOSIS — I10 Essential (primary) hypertension: Secondary | ICD-10-CM | POA: Diagnosis not present

## 2016-01-29 DIAGNOSIS — Z23 Encounter for immunization: Secondary | ICD-10-CM | POA: Diagnosis not present

## 2016-01-29 DIAGNOSIS — E782 Mixed hyperlipidemia: Secondary | ICD-10-CM | POA: Diagnosis not present

## 2016-03-02 DIAGNOSIS — J209 Acute bronchitis, unspecified: Secondary | ICD-10-CM | POA: Diagnosis not present

## 2016-03-29 ENCOUNTER — Encounter: Payer: Self-pay | Admitting: Gastroenterology

## 2016-04-14 ENCOUNTER — Encounter: Payer: Self-pay | Admitting: *Deleted

## 2016-04-15 ENCOUNTER — Encounter: Payer: Self-pay | Admitting: Cardiology

## 2016-04-15 ENCOUNTER — Ambulatory Visit (INDEPENDENT_AMBULATORY_CARE_PROVIDER_SITE_OTHER): Payer: Medicare Other | Admitting: Cardiology

## 2016-04-15 VITALS — BP 148/70 | HR 68 | Ht 63.0 in | Wt 204.0 lb

## 2016-04-15 DIAGNOSIS — I340 Nonrheumatic mitral (valve) insufficiency: Secondary | ICD-10-CM | POA: Diagnosis not present

## 2016-04-15 DIAGNOSIS — I471 Supraventricular tachycardia: Secondary | ICD-10-CM

## 2016-04-15 DIAGNOSIS — R002 Palpitations: Secondary | ICD-10-CM

## 2016-04-15 MED ORDER — DILTIAZEM HCL ER COATED BEADS 240 MG PO CP24: 240.0000 mg | ORAL_CAPSULE | Freq: Every day | ORAL | 3 refills | Status: DC

## 2016-04-15 NOTE — Progress Notes (Signed)
Cardiology Office Note  Date: 04/15/2016   ID: [name], DOB December 08, 1945, MRN [id number]  PCP: Curlene Labrum, MD  Primary Cardiologist: Rozann Lesches, MD   Chief Complaint  Patient presents with  . Follow-up palpitations    History of Present Illness: Kristina Berry is a 71 y.o. female seen in consultation back in July 2017. She presents for a follow-up visit. Reports adequate control of palpitations, no chest pain or syncope.  48 hour Holter monitor demonstrated sinus rhythm with rare PVCs and occasional to frequent PACs as well as brief episodes of PSVT. Calcium channel blocker dose was increased subsequent to this. She is tolerating Cardizem CD 240 mg daily.  Follow-up echocardiogram from July 2017 is outlined below.  Past Medical History:  Diagnosis Date  . Kristina Berry's cyst of knee   . Chronic low back pain   . Diastolic dysfunction   . Essential hypertension   . GERD (gastroesophageal reflux disease)   . Hyperlipidemia   . LBBB (left bundle branch block)   . Mitral regurgitation    Mild to moderate 2011  . Morbid obesity (Kristina Berry)   . Type 2 diabetes mellitus (Kristina Berry)     Current Outpatient Prescriptions  Medication Sig Dispense Refill  . albuterol (PROAIR HFA) 108 (90 Base) MCG/ACT inhaler Inhale 2 puffs into the lungs every 4 (four) hours as needed.    Marland Kitchen aspirin 81 MG tablet Take 1 tablet by mouth daily.    Marland Kitchen atorvastatin (LIPITOR) 20 MG tablet Take 1 tablet by mouth daily.    Marland Kitchen diltiazem (CARDIZEM CD) 240 MG 24 hr capsule Take 1 capsule (240 mg total) by mouth daily. 90 capsule 3  . furosemide (LASIX) 40 MG tablet Take 40 mg by mouth 2 (two) times daily.     . insulin glargine (LANTUS) 100 UNIT/ML injection Inject 20 Units into the skin at bedtime.    . sitaGLIPtin (JANUVIA) 100 MG tablet Take 1 tablet by mouth daily.    . irbesartan (AVAPRO) 150 MG tablet Take 1 tablet by mouth daily.    . metFORMIN (GLUCOPHAGE) 1000 MG tablet Take 1 tablet by mouth 2  (two) times daily.    Marland Kitchen omeprazole (PRILOSEC) 20 MG capsule Take 1 capsule by mouth daily.    Marland Kitchen PARoxetine (PAXIL) 10 MG tablet Take 0.5 tablets by mouth daily.     No current facility-administered medications for this visit.    Allergies:  Cefdinir; Nsaids; and Sulfa antibiotics   Social History: The patient  reports that she has never smoked. She has never used smokeless tobacco. She reports that she does not drink alcohol or use drugs.   ROS:  Please see the history of present illness. Otherwise, complete review of systems is positive for none.  All other systems are reviewed and negative.   Physical Exam: VS:  BP (!) 148/70   Pulse 68   Ht 5\' 3"  (1.6 m)   Wt 204 lb (92.5 kg)   SpO2 98%   BMI 36.14 kg/m , BMI Body mass index is 36.14 kg/m.  Wt Readings from Last 3 Encounters:  04/15/16 204 lb (92.5 kg)  10/24/15 207 lb (93.9 kg)    General: Obese woman, appears comfortable at rest. HEENT: Conjunctiva and lids normal, oropharynx clear. Neck: Supple, no elevated JVP or carotid bruits, no thyromegaly. Lungs: Clear to auscultation, nonlabored breathing at rest. Cardiac: Regular rate and rhythm, no S3, 1-4/4 systolic murmur at base, no pericardial rub. Abdomen: Soft, nontender, bowel sounds  present, no guarding or rebound. Extremities: Chronic appearing edema and stasis of the lower legs, distal pulses 2+.  ECG: I personally reviewed the tracing from 10/02/2015 which showed sinus rhythm with left bundle branch block.  Other Studies Reviewed Today:  Echocardiogram 11/05/2015: Study Conclusions  - Left ventricle: The cavity size was normal. Wall thickness was   normal. Systolic function was normal. The estimated ejection   fraction was in the range of 50% to 55%. Features are consistent   with a pseudonormal left ventricular filling pattern, with   concomitant abnormal relaxation and increased filling pressure   (grade 2 diastolic dysfunction). - Ventricular septum: Septal  motion showed abnormal function and   dyssynergy. - Aortic valve: Mildly calcified annulus. Trileaflet. - Mitral valve: There was mild regurgitation. - Left atrium: The atrium was mildly dilated. - Right atrium: Central venous pressure (est): 3 mm Hg. - Atrial septum: No defect or patent foramen ovale was identified. - Tricuspid valve: There was trivial regurgitation. - Pulmonary arteries: PA peak pressure: 22 mm Hg (S). - Pericardium, extracardiac: A trivial pericardial effusion was   identified posterior to the heart.  Impressions:  - Normal LV wall thickness with LVEF 50-55%. Septal dyssynergy   consistent with left bundle branch block. Grade 2 diastolic   dysfunction with increased LV filling pressure. Mild left atrial   enlargement. Mild mitral regurgitation. Mildly calcified aortic   annulus. Trivial tricuspid regurgitation with normal PASP 22   mmHg. Trivial posterior pericardial effusion.  Lexiscan Myoview 05/01/2014 [facility]): No ischemic defects reported, LVEF 48%.  Assessment and Plan:  1. Palpitations with documented PACs and brief bursts of SVT. She is tolerating Cardizem CD 240 mg daily, refill provided.  2. Sclerotic aortic valve with mild mitral regurgitation, asymptomatic.  3. Chronic left bundle branch block, previous reassuring ischemic workup. LVEF 50-55% by most recent echocardiogram.  Current medicines were reviewed with the patient today.  Disposition: Follow-up in 6 months.  Signed, Satira Sark, MD, [facility] 04/15/2016 3:51 PM    [place] at [place], [place], [place] [address] Phone: [phone]; Fax: [phone]

## 2016-04-15 NOTE — Patient Instructions (Signed)

## 2016-04-22 ENCOUNTER — Ambulatory Visit: Payer: Medicare Other | Admitting: Gastroenterology

## 2016-04-22 DIAGNOSIS — E1165 Type 2 diabetes mellitus with hyperglycemia: Secondary | ICD-10-CM | POA: Diagnosis not present

## 2016-04-22 DIAGNOSIS — D509 Iron deficiency anemia, unspecified: Secondary | ICD-10-CM | POA: Diagnosis not present

## 2016-04-22 DIAGNOSIS — D649 Anemia, unspecified: Secondary | ICD-10-CM | POA: Diagnosis not present

## 2016-04-22 DIAGNOSIS — E782 Mixed hyperlipidemia: Secondary | ICD-10-CM | POA: Diagnosis not present

## 2016-04-26 DIAGNOSIS — E1165 Type 2 diabetes mellitus with hyperglycemia: Secondary | ICD-10-CM | POA: Diagnosis not present

## 2016-04-26 DIAGNOSIS — I340 Nonrheumatic mitral (valve) insufficiency: Secondary | ICD-10-CM | POA: Diagnosis not present

## 2016-04-26 DIAGNOSIS — D509 Iron deficiency anemia, unspecified: Secondary | ICD-10-CM | POA: Diagnosis not present

## 2016-04-26 DIAGNOSIS — E782 Mixed hyperlipidemia: Secondary | ICD-10-CM | POA: Diagnosis not present

## 2016-04-26 DIAGNOSIS — I10 Essential (primary) hypertension: Secondary | ICD-10-CM | POA: Diagnosis not present

## 2016-05-18 ENCOUNTER — Telehealth: Payer: Self-pay | Admitting: Gastroenterology

## 2016-05-18 ENCOUNTER — Encounter (INDEPENDENT_AMBULATORY_CARE_PROVIDER_SITE_OTHER): Payer: Self-pay

## 2016-05-18 ENCOUNTER — Ambulatory Visit (INDEPENDENT_AMBULATORY_CARE_PROVIDER_SITE_OTHER): Payer: Medicare Other | Admitting: Gastroenterology

## 2016-05-18 ENCOUNTER — Encounter: Payer: Self-pay | Admitting: Gastroenterology

## 2016-05-18 ENCOUNTER — Other Ambulatory Visit: Payer: Self-pay

## 2016-05-18 DIAGNOSIS — D509 Iron deficiency anemia, unspecified: Secondary | ICD-10-CM

## 2016-05-18 DIAGNOSIS — D508 Other iron deficiency anemias: Secondary | ICD-10-CM | POA: Diagnosis not present

## 2016-05-18 HISTORY — DX: Iron deficiency anemia, unspecified: D50.9

## 2016-05-18 MED ORDER — PEG 3350-KCL-NA BICARB-NACL 420 G PO SOLR: 4000.0000 mL | ORAL | 0 refills | Status: DC

## 2016-05-18 NOTE — Assessment & Plan Note (Signed)
71 year old female with new onset IDA but without concerning GI features. Rare Aleve. No overt GI bleeding. Last colonoscopy reportedly at least 10-11 years ago by Dr. Anthony Sar. No family history of colon cancer or polyps. Needs colonoscopy/EGD in near future. May ultimately need iron infusion; we will request labs.   Proceed with colonoscopy/EGD with Dr. Oneida Alar in the near future. The risks, benefits, and alternatives have been discussed in detail with the patient. They state understanding and desire to proceed.  Hold iron X 7 days prior 1/2 dose lantus evening prior, no diabetes medications day of procedure

## 2016-05-18 NOTE — Telephone Encounter (Signed)
LMOM to call.

## 2016-05-18 NOTE — Progress Notes (Addendum)
REVIEWED-NO ADDITIONAL RECOMMENDATIONS.  Primary Care Physician:  Curlene Labrum, MD Primary Gastroenterologist:  Dr. Oneida Alar   Chief Complaint  Patient presents with  . Anemia    HPI:   Kristina Berry is a 71 y.o. female presenting today at the request of her PCP secondary to [place]. Outside labs from October 2017 with Hgb 9.4, ferritin 7, iron 23. She states recent labs were done in Jan 2018, which we are requesting.   Sometimes will feel tired. Stool is dark on iron. No hematochezia. No abdominal pain. Feels nauseated with iron. Feels constipated at times with iron.  No dysphagia. Occasional heartburn depending on food and what time of day. Taking omeprazole daily. Last colonoscopy 10 or 11 years ago by Dr. Anthony Sar. No polyps and everything looked good per her report. No family history of colon cancer.   Will rarely take an aleve but tries not to take anything of "them".      Past Medical History:  Diagnosis Date  . Baker's cyst of knee   . Chronic low back pain   . Diastolic dysfunction   . Essential hypertension   . GERD (gastroesophageal reflux disease)   . Hyperlipidemia   . LBBB (left bundle branch block)   . Mitral regurgitation    Mild to moderate 2011  . Morbid obesity ([place])   . Type 2 diabetes mellitus ([place])     Past Surgical History:  Procedure Laterality Date  . ADENOIDECTOMY    . BACK SURGERY  05/2014  . CHOLECYSTECTOMY  2009  . MELANOMA SURGERY Right   . SKIN CANCER EXCISION     Basal cell removal from left side of face  . TONSILLECTOMY      Current Outpatient Prescriptions  Medication Sig Dispense Refill  . albuterol (PROAIR HFA) 108 (90 Base) MCG/ACT inhaler Inhale 2 puffs into the lungs every 4 (four) hours as needed.    Marland Kitchen aspirin 81 MG tablet Take 1 tablet by mouth daily.    Marland Kitchen diltiazem (CARDIZEM CD) 240 MG 24 hr capsule Take 1 capsule (240 mg total) by mouth daily. 90 capsule 3  . furosemide (LASIX) 40 MG tablet Take 40 mg by mouth 2 (two)  times daily.     . insulin glargine (LANTUS) 100 UNIT/ML injection Inject 20 Units into the skin at bedtime.    . irbesartan (AVAPRO) 150 MG tablet Take 1 tablet by mouth daily.    . metFORMIN (GLUCOPHAGE) 1000 MG tablet Take 1 tablet by mouth 2 (two) times daily.    Marland Kitchen omeprazole (PRILOSEC) 20 MG capsule Take 1 capsule by mouth daily.    Marland Kitchen PARoxetine (PAXIL) 10 MG tablet Take 0.5 tablets by mouth daily.    . sitaGLIPtin (JANUVIA) 100 MG tablet Take 1 tablet by mouth daily.    Marland Kitchen atorvastatin (LIPITOR) 20 MG tablet Take 1 tablet by mouth daily.     No current facility-administered medications for this visit.     Allergies as of 05/18/2016 - Review Complete 05/18/2016  Allergen Reaction Noted  . Cefdinir  10/24/2015  . Nsaids  10/24/2015  . Sulfa antibiotics  10/24/2015    Family History  Problem Relation Age of Onset  . Osteoporosis Mother   . Dementia Mother   . Breast cancer Sister     Social History   Social History  . Marital status: Married    Spouse name: N/A  . Number of children: N/A  . Years of education: N/A   Occupational History  .  Not on file.   Social History Main Topics  . Smoking status: Never Smoker  . Smokeless tobacco: Never Used  . Alcohol use No  . Drug use: No  . Sexual activity: Not on file   Other Topics Concern  . Not on file   Social History Narrative  . No narrative on file    Review of Systems: Gen: Denies any fever, chills, fatigue, weight loss, lack of appetite.  CV: Denies chest pain, heart palpitations, peripheral edema, syncope.  Resp: Denies shortness of breath at rest or with exertion. Denies wheezing or cough.  GI: see HPI  GU : Denies urinary burning, urinary frequency, urinary hesitancy MS: Denies joint pain, muscle weakness, cramps, or limitation of movement.  Derm: Denies rash, itching, dry skin Psych: Denies depression, anxiety, memory loss, and confusion Heme: see HPI   Physical Exam: BP (!) 139/47   Pulse 73    Temp 97.9 F (36.6 C) (Oral)   Ht 5\' 5"  (1.651 m)   Wt 207 lb 6.4 oz (94.1 kg)   BMI 34.51 kg/m  General:   Alert and oriented. Pleasant and cooperative. Well-nourished and well-developed.  Head:  Normocephalic and atraumatic. Eyes:  Without icterus, sclera clear and conjunctiva pink.  Ears:  Normal auditory acuity. Nose:  No deformity, discharge,  or lesions. Mouth:  No deformity or lesions, oral mucosa pink.  Lungs:  Clear to auscultation bilaterally. No wheezes, rales, or rhonchi. No distress.  Heart:  S1, S2 present without murmurs appreciated.  Abdomen:  +BS, soft, non-tender and non-distended. No HSM noted. No guarding or rebound. No masses appreciated.  Rectal:  Deferred  Msk:  Symmetrical without gross deformities. Normal posture. Extremities:  Without  edema. Neurologic:  Alert and  oriented x4 Skin:  Intact without significant lesions or rashes. Psych:  Alert and cooperative. Normal mood and affect.  Labs Oct 2017: ferritin 7, iron 23, Hgb 9.4.

## 2016-05-18 NOTE — Patient Instructions (Signed)
We have scheduled you for a colonoscopy and upper endoscopy with Dr. Oneida Alar.   Take 1/2 dose of Lantus the evening before the procedure and no diabetes medication the day of the procedure.

## 2016-05-18 NOTE — Patient Instructions (Signed)
No PA needed for TCS/EGD. Decision ID# [id number]

## 2016-05-18 NOTE — Telephone Encounter (Signed)
I forgot to tell patient that we need to hold iron X 7 days prior. Please let her know, thanks!

## 2016-05-18 NOTE — Progress Notes (Signed)
cc'ed to pcp °

## 2016-05-19 NOTE — Telephone Encounter (Signed)
Pt is aware.  

## 2016-05-25 ENCOUNTER — Telehealth: Payer: Self-pay | Admitting: Gastroenterology

## 2016-05-25 NOTE — Telephone Encounter (Signed)
Hi!  Any outside labs from January available for patient?

## 2016-05-26 NOTE — Telephone Encounter (Signed)
Requested labs

## 2016-05-28 ENCOUNTER — Ambulatory Visit (HOSPITAL_COMMUNITY)
Admission: RE | Admit: 2016-05-28 | Discharge: 2016-05-28 | Disposition: A | Discharge status: Home / Self Care | Payer: Medicare Other | Source: Ambulatory Visit | Attending: Gastroenterology | Admitting: Gastroenterology

## 2016-05-28 ENCOUNTER — Encounter (HOSPITAL_COMMUNITY): Payer: Self-pay | Admitting: *Deleted

## 2016-05-28 ENCOUNTER — Encounter (HOSPITAL_COMMUNITY)
Admission: RE | Disposition: A | Discharge status: Home / Self Care | Payer: Self-pay | Source: Ambulatory Visit | Attending: Gastroenterology

## 2016-05-28 DIAGNOSIS — E119 Type 2 diabetes mellitus without complications: Secondary | ICD-10-CM | POA: Insufficient documentation

## 2016-05-28 DIAGNOSIS — K317 Polyp of stomach and duodenum: Secondary | ICD-10-CM | POA: Diagnosis not present

## 2016-05-28 DIAGNOSIS — D122 Benign neoplasm of ascending colon: Secondary | ICD-10-CM | POA: Insufficient documentation

## 2016-05-28 DIAGNOSIS — Z79899 Other long term (current) drug therapy: Secondary | ICD-10-CM | POA: Insufficient documentation

## 2016-05-28 DIAGNOSIS — K219 Gastro-esophageal reflux disease without esophagitis: Secondary | ICD-10-CM | POA: Diagnosis not present

## 2016-05-28 DIAGNOSIS — E785 Hyperlipidemia, unspecified: Secondary | ICD-10-CM | POA: Diagnosis not present

## 2016-05-28 DIAGNOSIS — K648 Other hemorrhoids: Secondary | ICD-10-CM

## 2016-05-28 DIAGNOSIS — I447 Left bundle-branch block, unspecified: Secondary | ICD-10-CM | POA: Diagnosis not present

## 2016-05-28 DIAGNOSIS — K2970 Gastritis, unspecified, without bleeding: Secondary | ICD-10-CM

## 2016-05-28 DIAGNOSIS — D509 Iron deficiency anemia, unspecified: Secondary | ICD-10-CM

## 2016-05-28 DIAGNOSIS — I340 Nonrheumatic mitral (valve) insufficiency: Secondary | ICD-10-CM | POA: Diagnosis not present

## 2016-05-28 DIAGNOSIS — K644 Residual hemorrhoidal skin tags: Secondary | ICD-10-CM | POA: Insufficient documentation

## 2016-05-28 DIAGNOSIS — Z794 Long term (current) use of insulin: Secondary | ICD-10-CM | POA: Diagnosis not present

## 2016-05-28 DIAGNOSIS — K2950 Unspecified chronic gastritis without bleeding: Secondary | ICD-10-CM | POA: Insufficient documentation

## 2016-05-28 DIAGNOSIS — I10 Essential (primary) hypertension: Secondary | ICD-10-CM | POA: Insufficient documentation

## 2016-05-28 DIAGNOSIS — Z7982 Long term (current) use of aspirin: Secondary | ICD-10-CM | POA: Insufficient documentation

## 2016-05-28 HISTORY — PX: POLYPECTOMY: SHX5525

## 2016-05-28 HISTORY — PX: COLONOSCOPY: SHX5424

## 2016-05-28 HISTORY — PX: ESOPHAGOGASTRODUODENOSCOPY: SHX5428

## 2016-05-28 HISTORY — PX: BIOPSY: SHX5522

## 2016-05-28 LAB — GLUCOSE, CAPILLARY: Glucose-Capillary: 143 mg/dL — ABNORMAL HIGH (ref 65–99)

## 2016-05-28 SURGERY — COLONOSCOPY
Anesthesia: Moderate Sedation

## 2016-05-28 MED ORDER — MIDAZOLAM HCL 5 MG/5ML IJ SOLN
INTRAMUSCULAR | Status: AC
  Filled 2016-05-28: qty 10

## 2016-05-28 MED ORDER — MEPERIDINE HCL 100 MG/ML IJ SOLN
INTRAMUSCULAR | Status: AC
  Filled 2016-05-28: qty 2

## 2016-05-28 MED ORDER — STERILE WATER FOR IRRIGATION IR SOLN
Status: DC | PRN
  Administered 2016-05-28: 2.5 mL

## 2016-05-28 MED ORDER — MEPERIDINE HCL 100 MG/ML IJ SOLN
INTRAMUSCULAR | Status: DC | PRN
  Administered 2016-05-28: 25 mg via INTRAVENOUS
  Administered 2016-05-28: 50 mg via INTRAVENOUS
  Administered 2016-05-28: 25 mg via INTRAVENOUS

## 2016-05-28 MED ORDER — LIDOCAINE VISCOUS 2 % MT SOLN
OROMUCOSAL | Status: DC | PRN
  Administered 2016-05-28: 1 via OROMUCOSAL

## 2016-05-28 MED ORDER — SODIUM CHLORIDE 0.9 % IV SOLN
INTRAVENOUS | Status: DC
  Administered 2016-05-28: 1000 mL via INTRAVENOUS

## 2016-05-28 MED ORDER — MIDAZOLAM HCL 5 MG/5ML IJ SOLN
INTRAMUSCULAR | Status: DC | PRN
  Administered 2016-05-28: 2 mg via INTRAVENOUS
  Administered 2016-05-28: 1 mg via INTRAVENOUS
  Administered 2016-05-28 (×2): 2 mg via INTRAVENOUS

## 2016-05-28 MED ORDER — LIDOCAINE VISCOUS 2 % MT SOLN
OROMUCOSAL | Status: AC
  Filled 2016-05-28: qty 15

## 2016-05-28 NOTE — Discharge Instructions (Signed)
NO SOURCE FOR HER ANEMIA WAS IDENTIFIED. You have internal hemorrhoids, and HAD 1 polyp removed. You have mild gastritis AND BENIGN STOMACH POLYPS. I biopsied your stomach AND SMALL BOWEL.   TO COMPLETE YOUR WORKUP FOR LOW BLOOD COUNT/LOW IRON, YOU WILL NEED A GIVENS CAPSULE STUDY IN 2 WEEKS. HOLD IRON FOR 7 DAYS PRIOR TO YOUR STUDY.  FOLLOW A HIGH FIBER/LOW FAT DIET. AVOID ITEMS THAT CAUSE BLOATING. SEE INFO BELOW.  YOUR BIOPSY RESULTS WILL BE AVAILABLE IN MY CHART AFTER FEB 20 AND MY OFFICE WILL CONTACT YOU IN 10-14 DAYS WITH YOUR RESULTS.   FOLLOW UP IN 4 MOS.   Next colonoscopy in 5-10 years.   ENDOSCOPY Care After Read the instructions outlined below and refer to this sheet in the next week. These discharge instructions provide you with general information on caring for yourself after you leave the hospital. While your treatment has been planned according to the most current medical practices available, unavoidable complications occasionally occur. If you have any problems or questions after discharge, call DR. Coree Riester, [phone].  ACTIVITY  You may resume your regular activity, but move at a slower pace for the next 24 hours.   Take frequent rest periods for the next 24 hours.   Walking will help get rid of the air and reduce the bloated feeling in your belly (abdomen).   No driving for 24 hours (because of the medicine (anesthesia) used during the test).   You may shower.   Do not sign any important legal documents or operate any machinery for 24 hours (because of the anesthesia used during the test).    NUTRITION  Drink plenty of fluids.   You may resume your normal diet as instructed by your doctor.   Begin with a light meal and progress to your normal diet. Heavy or fried foods are harder to digest and may make you feel sick to your stomach (nauseated).   Avoid alcoholic beverages for 24 hours or as instructed.    MEDICATIONS  You may resume your normal  medications.   WHAT YOU CAN EXPECT TODAY  Some feelings of bloating in the abdomen.   Passage of more gas than usual.   Spotting of blood in your stool or on the toilet paper  .  IF YOU HAD POLYPS REMOVED DURING THE ENDOSCOPY:  Eat a soft diet IF YOU HAVE NAUSEA, BLOATING, ABDOMINAL PAIN, OR VOMITING.    FINDING OUT THE RESULTS OF YOUR TEST Not all test results are available during your visit. DR. Oneida Alar WILL CALL YOU WITHIN 7 DAYS OF YOUR PROCEDUE WITH YOUR RESULTS. Do not assume everything is normal if you have not heard from DR. Kiaraliz Rafuse IN ONE WEEK, CALL HER OFFICE AT [phone].  SEEK IMMEDIATE MEDICAL ATTENTION AND CALL THE OFFICE: [phone] IF:  You have more than a spotting of blood in your stool.   Your belly is swollen (abdominal distention).   You are nauseated or vomiting.   You have a temperature over 101F.   You have abdominal pain or discomfort that is severe or gets worse throughout the day.  Polyps, Colon  A polyp is extra tissue that grows inside your body. Colon polyps grow in the large intestine. The large intestine, also called the colon, is part of your digestive system. It is a long, hollow tube at the end of your digestive tract where your body makes and stores stool. Most polyps are not dangerous. They are benign. This means they are not cancerous.  But over time, some types of polyps can turn into cancer. Polyps that are smaller than a pea are usually not harmful. But larger polyps could someday become or may already be cancerous. To be safe, doctors remove all polyps and test them.   WHO GETS POLYPS? Anyone can get polyps, but certain people are more likely than others. You may have a greater chance of getting polyps if:  You are over 50.   You have had polyps before.   Someone in your family has had polyps.   Someone in your family has had cancer of the large intestine.   Find out if someone in your family has had polyps. You may also be  more likely to get polyps if you:   Eat a lot of fatty foods   Smoke   Drink alcohol   Do not exercise  Eat too much   PREVENTION There is not one sure way to prevent polyps. You might be able to lower your risk of getting them if you:  Eat more fruits and vegetables and less fatty food.   Do not smoke.   Avoid alcohol.   Exercise every day.   Lose weight if you are overweight.   Eating more calcium and folate can also lower your risk of getting polyps. Some foods that are rich in calcium are milk, cheese, and broccoli. Some foods that are rich in folate are chickpeas, kidney beans, and spinach.    Gastritis  Gastritis is an inflammation (the body's way of reacting to injury and/or infection) of the stomach. It is often caused by viral or bacterial (germ) infections. It can also be caused BY ASPIRIN, BC/GOODY POWDER'S, (IBUPROFEN) MOTRIN, OR ALEVE (NAPROXEN), chemicals (including alcohol), SPICY FOODS, and medications. This illness may be associated with generalized malaise (feeling tired, not well), UPPER ABDOMINAL STOMACH cramps, and fever. One common bacterial cause of gastritis is an organism known as H. Pylori. This can be treated with antibiotics.    High-Fiber Diet A high-fiber diet changes your normal diet to include more whole grains, legumes, fruits, and vegetables. Changes in the diet involve replacing refined carbohydrates with unrefined foods. The calorie level of the diet is essentially unchanged. The Dietary Reference Intake (recommended amount) for adult males is 38 grams per day. For adult females, it is 25 grams per day. Pregnant and lactating women should consume 28 grams of fiber per day. Fiber is the intact part of a plant that is not broken down during digestion. Functional fiber is fiber that has been isolated from the plant to provide a beneficial effect in the body. PURPOSE  Increase stool bulk.   Ease and regulate bowel movements.   Lower cholesterol.    INDICATIONS THAT YOU NEED MORE FIBER  Constipation and hemorrhoids.   Uncomplicated diverticulosis (intestine condition) and irritable bowel syndrome.   Weight management.   As a protective measure against hardening of the arteries (atherosclerosis), diabetes, and cancer.   GUIDELINES FOR INCREASING FIBER IN THE DIET  Start adding fiber to the diet slowly. A gradual increase of about 5 more grams (2 slices of whole-wheat bread, 2 servings of most fruits or vegetables, or 1 bowl of high-fiber cereal) per day is best. Too rapid an increase in fiber may result in constipation, flatulence, and bloating.   Drink enough water and fluids to keep your urine clear or pale yellow. Water, juice, or caffeine-free drinks are recommended. Not drinking enough fluid may cause constipation.   Eat a variety  of high-fiber foods rather than one type of fiber.   Try to increase your intake of fiber through using high-fiber foods rather than fiber pills or supplements that contain small amounts of fiber.   The goal is to change the types of food eaten. Do not supplement your present diet with high-fiber foods, but replace foods in your present diet.  INCLUDE A VARIETY OF FIBER SOURCES  Replace refined and processed grains with whole grains, canned fruits with fresh fruits, and incorporate other fiber sources. White rice, white breads, and most bakery goods contain little or no fiber.   Brown whole-grain rice, buckwheat oats, and many fruits and vegetables are all good sources of fiber. These include: broccoli, Brussels sprouts, cabbage, cauliflower, beets, sweet potatoes, white potatoes (skin on), carrots, tomatoes, eggplant, squash, berries, fresh fruits, and dried fruits.   Cereals appear to be the richest source of fiber. Cereal fiber is found in whole grains and bran. Bran is the fiber-rich outer coat of cereal grain, which is largely removed in refining. In whole-grain cereals, the bran remains. In  breakfast cereals, the largest amount of fiber is found in those with "bran" in their names. The fiber content is sometimes indicated on the label.   You may need to include additional fruits and vegetables each day.   In baking, for 1 cup white flour, you may use the following substitutions:   1 cup whole-wheat flour minus 2 tablespoons.   1/2 cup white flour plus 1/2 cup whole-wheat flour.   Low-Fat Diet BREADS, CEREALS, PASTA, RICE, DRIED PEAS, AND BEANS These products are high in carbohydrates and most are low in fat. Therefore, they can be increased in the diet as substitutes for fatty foods. They too, however, contain calories and should not be eaten in excess. Cereals can be eaten for snacks as well as for breakfast.  Include foods that contain fiber (fruits, vegetables, whole grains, and legumes). Research shows that fiber may lower blood cholesterol levels, especially the water-soluble fiber found in fruits, vegetables, oat products, and legumes. FRUITS AND VEGETABLES It is good to eat fruits and vegetables. Besides being sources of fiber, both are rich in vitamins and some minerals. They help you get the daily allowances of these nutrients. Fruits and vegetables can be used for snacks and desserts. MEATS Limit lean meat, chicken, [place], and fish to no more than 6 ounces per day. Beef, Pork, and Lamb Use lean cuts of beef, pork, and lamb. Lean cuts include:  Extra-lean ground beef.  Arm roast.  Sirloin tip.  Center-cut ham.  Round steak.  Loin chops.  Rump roast.  Tenderloin.  Trim all fat off the outside of meats before cooking. It is not necessary to severely decrease the intake of red meat, but lean choices should be made. Lean meat is rich in protein and contains a highly absorbable form of iron. Premenopausal women, in particular, should avoid reducing lean red meat because this could increase the risk for low red blood cells (iron-deficiency anemia). The organ meats, such  as liver, sweetbreads, kidneys, and brain are very rich in cholesterol. They should be limited. Chicken and [place] These are good sources of protein. The fat of poultry can be reduced by removing the skin and underlying fat layers before cooking. Chicken and [place] can be substituted for lean red meat in the diet. Poultry should not be fried or covered with high-fat sauces. Fish and Shellfish Fish is a good source of protein. Shellfish contain cholesterol, but  they usually are low in saturated fatty acids. The preparation of fish is important. Like chicken and [place], they should not be fried or covered with high-fat sauces. EGGS Egg whites contain no fat or cholesterol. They can be eaten often. Try 1 to 2 egg whites instead of whole eggs in recipes or use egg substitutes that do not contain yolk. MILK AND DAIRY PRODUCTS Use skim or 1% milk instead of 2% or whole milk. Decrease whole milk, natural, and processed cheeses. Use nonfat or low-fat (2%) cottage cheese or low-fat cheeses made from vegetable oils. Choose nonfat or low-fat (1 to 2%) yogurt. Experiment with evaporated skim milk in recipes that call for heavy cream. Substitute low-fat yogurt or low-fat cottage cheese for sour cream in dips and salad dressings. Have at least 2 servings of low-fat dairy products, such as 2 glasses of skim (or 1%) milk each day to help get your daily calcium intake.  FATS AND OILS Reduce the total intake of fats, especially saturated fat. Butterfat, lard, and beef fats are high in saturated fat and cholesterol. These should be avoided as much as possible. Vegetable fats do not contain cholesterol, but certain vegetable fats, such as coconut oil, palm oil, and palm kernel oil are very high in saturated fats. These should be limited. These fats are often used in bakery goods, processed foods, popcorn, oils, and nondairy creamers. Vegetable shortenings and some peanut butters contain hydrogenated oils, which are also  saturated fats. Read the labels on these foods and check for saturated vegetable oils. Unsaturated vegetable oils and fats do not raise blood cholesterol. However, they should be limited because they are fats and are high in calories. Total fat should still be limited to 30% of your daily caloric intake. Desirable liquid vegetable oils are corn oil, cottonseed oil, olive oil, canola oil, safflower oil, soybean oil, and sunflower oil. Peanut oil is not as good, but small amounts are acceptable. Buy a heart-healthy tub margarine that has no partially hydrogenated oils in the ingredients. Mayonnaise and salad dressings often are made from unsaturated fats, but they should also be limited because of their high calorie and fat content. Seeds, nuts, peanut butter, olives, and avocados are high in fat, but the fat is mainly the unsaturated type. These foods should be limited mainly to avoid excess calories and fat. OTHER EATING TIPS Snacks  Most sweets should be limited as snacks. They tend to be rich in calories and fats, and their caloric content outweighs their nutritional value. Some good choices in snacks are graham crackers, melba toast, soda crackers, bagels (no egg), English muffins, fruits, and vegetables. These snacks are preferable to snack crackers, [place] fries, and chips. Popcorn should be air-popped or cooked in small amounts of liquid vegetable oil. Desserts Eat fruit, low-fat yogurt, and fruit ices. AVOID pastries, cake, and cookies. Sherbet, angel food cake, gelatin dessert, frozen low-fat yogurt, or other frozen products that do not contain saturated fat (pure fruit juice bars, frozen ice pops) are also acceptable.  COOKING METHODS Choose those methods that use little or no fat. They include: Poaching.  Braising.  Steaming.  Grilling.  Baking.  Stir-frying.  Broiling.  Microwaving.  Foods can be cooked in a nonstick pan without added fat, or use a nonfat cooking spray in regular  cookware. Limit fried foods and avoid frying in saturated fat. Add moisture to lean meats by using water, broth, cooking wines, and other nonfat or low-fat sauces along with the cooking methods mentioned  above. Soups and stews should be chilled after cooking. The fat that forms on top after a few hours in the refrigerator should be skimmed off. When preparing meals, avoid using excess salt. Salt can contribute to raising blood pressure in some people. EATING AWAY FROM HOME Order entres, potatoes, and vegetables without sauces or butter. When meat exceeds the size of a deck of cards (3 to 4 ounces), the rest can be taken home for another meal. Choose vegetable or fruit salads and ask for low-calorie salad dressings to be served on the side. Use dressings sparingly. Limit high-fat toppings, such as bacon, crumbled eggs, cheese, sunflower seeds, and olives. Ask for heart-healthy tub margarine instead of butter.   Hemorrhoids Hemorrhoids are dilated (enlarged) veins around the rectum. Sometimes clots will form in the veins. This makes them swollen and painful. These are called thrombosed hemorrhoids. Causes of hemorrhoids include:  Constipation.   Straining to have a bowel movement.   HEAVY LIFTING HOME CARE INSTRUCTIONS  Eat a well balanced diet and drink 6 to 8 glasses of water every day to avoid constipation. You may also use a bulk laxative.   Avoid straining to have bowel movements.   Keep anal area dry and clean.   Do not use a donut shaped pillow or sit on the toilet for long periods. This increases blood pooling and pain.   Move your bowels when your body has the urge; this will require less straining and will decrease pain and pressure.

## 2016-05-28 NOTE — H&P (Signed)
Primary Care Physician:  Curlene Labrum, MD Primary Gastroenterologist:  Dr. Oneida Alar  Pre-Procedure History & Physical: HPI:  Kristina Berry is a 71 y.o. female here for [place].  Past Medical History:  Diagnosis Date  . Baker's cyst of knee   . Chronic low back pain   . Diastolic dysfunction   . Essential hypertension   . GERD (gastroesophageal reflux disease)   . Hyperlipidemia   . LBBB (left bundle branch block)   . Mitral regurgitation    Mild to moderate 2011  . Morbid obesity ([place])   . Type 2 diabetes mellitus ([place])     Past Surgical History:  Procedure Laterality Date  . ADENOIDECTOMY    . BACK SURGERY  05/2014  . CHOLECYSTECTOMY  2009  . MELANOMA SURGERY Right   . SKIN CANCER EXCISION     Basal cell removal from left side of face  . TONSILLECTOMY      Prior to Admission medications   Medication Sig Start Date End Date Taking? Authorizing Provider  aspirin 81 MG tablet Take 1 tablet by mouth daily.   Yes Historical Provider, MD  atorvastatin (LIPITOR) 20 MG tablet Take 1 tablet by mouth daily.   Yes Historical Provider, MD  diltiazem (CARDIZEM CD) 240 MG 24 hr capsule Take 1 capsule (240 mg total) by mouth daily. 04/15/16 07/14/16 Yes Satira Sark, MD  furosemide (LASIX) 40 MG tablet Take 40 mg by mouth 2 (two) times daily.  08/28/15 08/21/16 Yes Historical Provider, MD  insulin glargine (LANTUS) 100 UNIT/ML injection Inject 20 Units into the skin at bedtime.   Yes Historical Provider, MD  irbesartan (AVAPRO) 150 MG tablet Take 1 tablet by mouth daily.   Yes Historical Provider, MD  metFORMIN (GLUCOPHAGE) 1000 MG tablet Take 1 tablet by mouth 2 (two) times daily.   Yes Historical Provider, MD  omeprazole (PRILOSEC) 20 MG capsule Take 1 capsule by mouth daily.   Yes Historical Provider, MD  PARoxetine (PAXIL) 10 MG tablet Take 0.5 tablets by mouth daily. 02/25/15  Yes Historical Provider, MD  polyethylene glycol-electrolytes (TRILYTE) 420 g solution  Take 4,000 mLs by mouth as directed. 05/18/16  Yes Danie Binder, MD  sitaGLIPtin (JANUVIA) 100 MG tablet Take 1 tablet by mouth daily. 07/11/15 07/04/16 Yes Historical Provider, MD  albuterol (PROAIR HFA) 108 (90 Base) MCG/ACT inhaler Inhale 2 puffs into the lungs every 4 (four) hours as needed for wheezing or shortness of breath.  12/11/13   Historical Provider, MD    Allergies as of 05/18/2016 - Review Complete 05/18/2016  Allergen Reaction Noted  . Cefdinir  10/24/2015  . Nsaids  10/24/2015  . Sulfa antibiotics  10/24/2015    Family History  Problem Relation Age of Onset  . Osteoporosis Mother   . Dementia Mother   . Diabetes Mother   . Breast cancer Sister   . Heart Problems Father   . Diabetes Father   . Breast cancer Sister   . COPD Sister   . Colon cancer Neg Hx     Social History   Social History  . Marital status: Married    Spouse name: N/A  . Number of children: N/A  . Years of education: N/A   Occupational History  . Not on file.   Social History Main Topics  . Smoking status: Never Smoker  . Smokeless tobacco: Never Used  . Alcohol use No  . Drug use: No  . Sexual activity: Not on file  Other Topics Concern  . Not on file   Social History Narrative  . No narrative on file    Review of Systems: See HPI, otherwise negative ROS   Physical Exam: BP (!) 151/64   Pulse 91   Temp 98 F (36.7 C) (Oral)   Resp 14   Ht 5\' 3"  (1.6 m)   Wt 207 lb (93.9 kg)   SpO2 98%   BMI 36.67 kg/m  General:   Alert,  pleasant and cooperative in NAD Head:  Normocephalic and atraumatic. Neck:  Supple; Lungs:  Clear throughout to auscultation.    Heart:  Regular rate and rhythm. Abdomen:  Soft, nontender and nondistended. Normal bowel sounds, without guarding, and without rebound.   Neurologic:  Alert and  oriented x4;  grossly normal neurologically.  Impression/Plan:    IRON DEFICIENCY ANEMIA.  PLAN:  1. TCS/EGD TODAY. DISCUSSED PROCEDURE, BENEFITS, &  RISKS: < 1% chance of medication reaction, bleeding, perforation, or rupture of spleen/liver.

## 2016-05-28 NOTE — Op Note (Signed)
[facility] Patient Name: Kristina Berry Procedure Date: 05/28/2016 10:17 AM MRN: [id number] Date of Birth: 1945-11-09 Attending MD: Barney Drain , MD CSN: [id number] Age: 71 Admit Type: Outpatient Procedure:                Colonoscopy WITH SNARE POLYPECTOMY Indications:              Unexplained iron deficiency anemia Providers:                Barney Drain, MD, Lurline Del, RN, [employer] [employer]. [place],                            [employer] Referring MD:             Curlene Labrum Medicines:                Meperidine 75 mg IV, Midazolam 5 mg IV Complications:            No immediate complications. Estimated Blood Loss:     Estimated blood loss: none. Procedure:                Pre-Anesthesia Assessment:                           - Prior to the procedure, a History and Physical                            was performed, and patient medications and                            allergies were reviewed. The patient's tolerance of                            previous anesthesia was also reviewed. The risks                            and benefits of the procedure and the sedation                            options and risks were discussed with the patient.                            All questions were answered, and informed consent                            was obtained. Prior Anticoagulants: The patient has                            taken aspirin, last dose was 1 day prior to                            procedure. ASA Grade Assessment: II - A patient                            with mild systemic disease. After reviewing the  risks and benefits, the patient was deemed in                            satisfactory condition to undergo the procedure.                            After obtaining informed consent, the colonoscope                            was passed under direct vision. Throughout the                            procedure, the patient's blood pressure, pulse,  and                            oxygen saturations were monitored continuously. The                            EC-[id number] ([id number]) scope was introduced through                            the anus and advanced to the 15 cm into the ileum.                            The colonoscopy was performed without difficulty.                            The patient tolerated the procedure well. The                            quality of the bowel preparation was excellent. The                            terminal ileum, ileocecal valve, appendiceal                            orifice, and rectum were photographed. Scope In: 10:36:11 AM Scope Out: 10:57:30 AM Scope Withdrawal Time: 0 hours 18 minutes 44 seconds  Total Procedure Duration: 0 hours 21 minutes 19 seconds  Findings:      A 8 mm polyp was found in the proximal ascending colon. The polyp was       sessile. The polyp was removed with a hot snare. Resection and retrieval       were complete.      Multiple small and large-mouthed diverticula were found in the       recto-sigmoid colon, sigmoid colon and descending colon.      External and internal hemorrhoids were found during retroflexion. Impression:               - One 8 mm polyp in the proximal ascending colon,                            removed with a hot snare. Resected and retrieved.                           -  Diverticulosis in the recto-sigmoid colon, in the                            sigmoid colon and in the descending colon.                           - External and internal hemorrhoids.                           -NO SOURCE FOR Anemia IDENTIFIED Moderate Sedation:      Moderate (conscious) sedation was administered by the endoscopy nurse       and supervised by the endoscopist. The following parameters were       monitored: oxygen saturation, heart rate, blood pressure, and response       to care. Total physician intraservice time was 54 minutes. Recommendation:           - High fiber  diet and low fat diet.                           - Continue present medications.                           - Await pathology results.                           - Repeat colonoscopy in 5-10 years for surveillance.                           - Return to my office in 4 months.                           - Patient has a contact number available for                            emergencies. The signs and symptoms of potential                            delayed complications were discussed with the                            patient. Return to normal activities tomorrow.                            Written discharge instructions were provided to the                            patient. Procedure Code(s):        --- Professional ---                           [phone], Colonoscopy, flexible; with removal of                            tumor(s), polyp(s), or other lesion(s) by snare  technique                           [phone], Moderate sedation services provided by the                            same physician or other qualified health care                            professional performing the diagnostic or                            therapeutic service that the sedation supports,                            requiring the presence of an independent trained                            observer to assist in the monitoring of the                            patient's level of consciousness and physiological                            status; initial 15 minutes of intraservice time,                            patient age 78 years or older                           [phone], Moderate sedation services; each additional                            15 minutes intraservice time                           99153, Moderate sedation services; each additional                            15 minutes intraservice time                           99153, Moderate sedation services; each additional                             15 minutes intraservice time Diagnosis Code(s):        --- Professional ---                           D12.2, Benign neoplasm of ascending colon                           K64.8, Other hemorrhoids                           D50.9, Iron deficiency anemia, unspecified  K57.30, Diverticulosis of large intestine without                            perforation or abscess without bleeding CPT copyright 2016 American [employer] Association. All rights reserved. The codes documented in this report are preliminary and upon coder review may  be revised to meet current compliance requirements. Barney Drain, MD Barney Drain, MD 05/28/2016 11:34:40 AM This report has been signed electronically. Number of Addenda: 0

## 2016-05-28 NOTE — Op Note (Signed)
[facility] Patient Name: Kristina Berry Procedure Date: 05/28/2016 11:01 AM MRN: [id number] Date of Birth: 03/22/1946 Attending MD: Barney Drain , MD CSN: [id number] Age: 71 Admit Type: Outpatient Procedure:                Upper GI endoscopy WITH COLD FORCEPS BIOPSY Indications:              Unexplained iron deficiency anemia-FERRITIN 7 ON                            ASA & OMEPRAZOLE. Providers:                Barney Drain, MD, Lurline Del, RN, [facility],                            Technician Referring MD:             Curlene Labrum Medicines:                Meperidine 25 mg IV, Midazolam 2 mg IV Complications:            No immediate complications. Estimated Blood Loss:     Estimated blood loss was minimal. Procedure:                Pre-Anesthesia Assessment:                           - Prior to the procedure, a History and Physical                            was performed, and patient medications and                            allergies were reviewed. The patient's tolerance of                            previous anesthesia was also reviewed. The risks                            and benefits of the procedure and the sedation                            options and risks were discussed with the patient.                            All questions were answered, and informed consent                            was obtained. Prior Anticoagulants: The patient has                            taken aspirin, last dose was 1 day prior to                            procedure. ASA Grade Assessment: II - A patient  with mild systemic disease. After reviewing the                            risks and benefits, the patient was deemed in                            satisfactory condition to undergo the procedure.                            After obtaining informed consent, the endoscope was                            passed under direct vision. Throughout the                      procedure, the patient's blood pressure, pulse, and                            oxygen saturations were monitored continuously. The                            [id number] ([id number]) was introduced through the                            mouth, and advanced to the second part of duodenum.                            The upper GI endoscopy was accomplished without                            difficulty. The patient tolerated the procedure                            well. Scope In: 11:05:57 AM Scope Out: 11:17:15 AM Total Procedure Duration: 0 hours 11 minutes 18 seconds  Findings:      The examined esophagus was normal.      Multiple medium sessile polyps with no stigmata of recent bleeding were       found in the cardia and in the gastric fundus. This was biopsied with a       cold forceps for histology.      Diffuse and patchy mild inflammation characterized by congestion (edema)       and erythema was found in the gastric body and in the gastric antrum.       Biopsies were taken with a cold forceps for Helicobacter pylori testing.      The examined duodenum was normal. Biopsies for histology were taken with       a cold forceps for evaluation of celiac disease. Impression:               - Multiple BENIGN APPEARING gastric polyps.                           - MODERATE Gastritis.                           - NO  OBVIOUS SOURCE FOR ANEMIA IDENTIFIED Moderate Sedation:      Moderate (conscious) sedation was administered by the endoscopy nurse       and supervised by the endoscopist. The following parameters were       monitored: oxygen saturation, heart rate, blood pressure, and response       to care. Total physician intraservice time was 54 minutes. Recommendation:           - Resume previous diet.                           - Continue present medications.                           - Await pathology results. WILL NEED GIVENS CAPSULE                            IN 2 WEEKS. HOLD  IRON FOR 7 DAYS.                           - Return to my office in 4 months.                           - Patient has a contact number available for                            emergencies. The signs and symptoms of potential                            delayed complications were discussed with the                            patient. Return to normal activities tomorrow.                            Written discharge instructions were provided to the                            patient. Procedure Code(s):        --- Professional ---                           [phone], Esophagogastroduodenoscopy, flexible,                            transoral; with biopsy, single or multiple                           99152, Moderate sedation services provided by the                            same physician or other qualified health care                            professional performing the diagnostic or  therapeutic service that the sedation supports,                            requiring the presence of an independent trained                            observer to assist in the monitoring of the                            patient's level of consciousness and physiological                            status; initial 15 minutes of intraservice time,                            patient age 34 years or older                           [phone], Moderate sedation services; each additional                            15 minutes intraservice time                           99153, Moderate sedation services; each additional                            15 minutes intraservice time                           99153, Moderate sedation services; each additional                            15 minutes intraservice time Diagnosis Code(s):        --- Professional ---                           K31.7, Polyp of stomach and duodenum                           K29.70, Gastritis, unspecified, without bleeding                            D50.9, Iron deficiency anemia, unspecified CPT copyright 2016 American Medical Association. All rights reserved. The codes documented in this report are preliminary and upon coder review may  be revised to meet current compliance requirements. Barney Drain, MD Barney Drain, MD 05/28/2016 11:40:20 AM This report has been signed electronically. Number of Addenda: 0

## 2016-05-31 ENCOUNTER — Encounter: Payer: Self-pay | Admitting: Gastroenterology

## 2016-06-02 ENCOUNTER — Encounter (HOSPITAL_COMMUNITY): Payer: Self-pay | Admitting: Gastroenterology

## 2016-06-10 ENCOUNTER — Telehealth: Payer: Self-pay | Admitting: Gastroenterology

## 2016-06-10 ENCOUNTER — Encounter (HOSPITAL_COMMUNITY): Payer: Self-pay | Admitting: Gastroenterology

## 2016-06-10 NOTE — Telephone Encounter (Signed)
Reminder in epic °

## 2016-06-10 NOTE — Telephone Encounter (Signed)
Pt is set up for GIVENS on 06/23/16 @ 8:00 am. She is aware and instructions are in the mail

## 2016-06-10 NOTE — Telephone Encounter (Signed)
No PA is needed for GIVENS (Decision ID#: [id number])

## 2016-06-10 NOTE — Telephone Encounter (Signed)
Please call pt. She had ONE simple adenoma removed.  TO COMPLETE YOUR WORKUP FOR LOW BLOOD COUNT/LOW IRON, YOU WILL NEED A GIVENS CAPSULE STUDY IN 2 WEEKS. HOLD IRON FOR 7 DAYS PRIOR TO STUDY.  FOLLOW A HIGH FIBER/LOW FAT DIET. AVOID ITEMS THAT CAUSE BLOATING.   FOLLOW UP IN 4 MOS E30 ANEMIA.   Next colonoscopy in 5-10 years IF THE BENEFITS OUTWEIGH THE RISKS.

## 2016-06-10 NOTE — Telephone Encounter (Signed)
PT is aware of results and plan.  

## 2016-06-10 NOTE — Telephone Encounter (Signed)
TO COMPLETE YOUR WORKUP FOR LOW BLOOD COUNT/LOW IRON, YOU WILL NEED A GIVENS CAPSULE STUDY IN 2 WEEKS. HOLD IRON FOR 7 DAYS PRIOR TO STUDY.  PER SF

## 2016-06-16 ENCOUNTER — Other Ambulatory Visit: Payer: Self-pay

## 2016-06-16 DIAGNOSIS — D509 Iron deficiency anemia, unspecified: Secondary | ICD-10-CM

## 2016-06-16 NOTE — Telephone Encounter (Signed)
Outside labs from Jan 2018 with Hgb 10.4, Hct 33.3, Iron 43, ferritin 7. She is taking iron. Let's recheck CBC, iron, ferritin, TIBC in April. I anticipate she may need iron infusion.

## 2016-06-17 NOTE — Telephone Encounter (Signed)
PT was made aware on 06/16/2016. Lab orders on file for 07/20/2016.

## 2016-06-23 ENCOUNTER — Other Ambulatory Visit: Payer: Self-pay

## 2016-06-23 ENCOUNTER — Ambulatory Visit (HOSPITAL_COMMUNITY)
Admission: RE | Admit: 2016-06-23 | Discharge: 2016-06-23 | Disposition: A | Discharge status: Home / Self Care | Payer: Medicare Other | Source: Ambulatory Visit | Attending: Gastroenterology | Admitting: Gastroenterology

## 2016-06-23 ENCOUNTER — Encounter (HOSPITAL_COMMUNITY)
Admission: RE | Disposition: A | Discharge status: Home / Self Care | Payer: Self-pay | Source: Ambulatory Visit | Attending: Gastroenterology

## 2016-06-23 DIAGNOSIS — D509 Iron deficiency anemia, unspecified: Secondary | ICD-10-CM | POA: Diagnosis not present

## 2016-06-23 DIAGNOSIS — D649 Anemia, unspecified: Secondary | ICD-10-CM

## 2016-06-23 DIAGNOSIS — K529 Noninfective gastroenteritis and colitis, unspecified: Secondary | ICD-10-CM | POA: Diagnosis not present

## 2016-06-23 HISTORY — PX: GIVENS CAPSULE STUDY: SHX5432

## 2016-06-23 SURGERY — IMAGING PROCEDURE, GI TRACT, INTRALUMINAL, VIA CAPSULE

## 2016-06-25 ENCOUNTER — Encounter (HOSPITAL_COMMUNITY): Payer: Self-pay | Admitting: Gastroenterology

## 2016-07-05 ENCOUNTER — Other Ambulatory Visit: Payer: Self-pay

## 2016-07-05 DIAGNOSIS — D509 Iron deficiency anemia, unspecified: Secondary | ICD-10-CM

## 2016-07-10 ENCOUNTER — Telehealth: Payer: Self-pay | Admitting: Gastroenterology

## 2016-07-10 NOTE — Telephone Encounter (Signed)
PLEASE CALL PT. HER GIVENS SHOWS A MILD IRRITATION IN HER SMALL BOWEL. NO OBVIOUS SOURCE FOR HER ANEMIA WAS IDENTIFIED.   SHE SHOULD: 1. SEE HEMATOLOGY REFERRAL FOR HER ANEMIA & TO EVALUATE NEED FOR IV FE 2. CONTINUE ORAL IRON. 3. OPV IN JUN 2018 E30 MICROCYTIC ANEMIA.

## 2016-07-10 NOTE — Procedures (Signed)
  PATIENT DATA: WEIGHT: 204 lbs   WAIST: 62 in  GASTRIC PASSAGE TIME: 6 m, SB PASSAGE TIME: 2H 82m  RESULTS: LIMITED views of gastric mucosa due to retained contents.  OCCASIONAL EROSION IN THE ILEUM. OTHERWISE, no ULCERS, masses or AVMs seen IN THE SMALL BOWEL.  LIMITED VIEWS OF THE COLON DUE TO RETAINED CONTENTS. No old blood or fresh blood in the stomach, small bowel, or colon.  DIAGNOSIS: MILD IELITIS  Plan: 1. HEMATOLOGY REFERRAL FOR Dx: MICROCYTIC ANEMIA, EVALUATE FOR IV FE 2. OPV IN JUL 2018.

## 2016-07-12 ENCOUNTER — Other Ambulatory Visit: Payer: Self-pay

## 2016-07-12 DIAGNOSIS — D509 Iron deficiency anemia, unspecified: Secondary | ICD-10-CM

## 2016-07-12 NOTE — Telephone Encounter (Signed)
Patient scheduled for appointment.

## 2016-07-12 NOTE — Telephone Encounter (Signed)
Referral sent to hematology via Epic. 

## 2016-07-12 NOTE — Telephone Encounter (Signed)
Pt is aware.  

## 2016-07-20 DIAGNOSIS — D509 Iron deficiency anemia, unspecified: Secondary | ICD-10-CM | POA: Diagnosis not present

## 2016-07-20 LAB — CBC WITH DIFFERENTIAL/PLATELET
BASOS ABS: 0 {cells}/uL (ref 0–200)
Basophils Relative: 0 %
EOS ABS: 216 {cells}/uL (ref 15–500)
EOS PCT: 3 %
HCT: 32.2 % — ABNORMAL LOW (ref 35.0–45.0)
Hemoglobin: 9.9 g/dL — ABNORMAL LOW (ref 11.7–15.5)
LYMPHS PCT: 28 %
Lymphs Abs: 2016 cells/uL (ref 850–3900)
MCH: 24.8 pg — AB (ref 27.0–33.0)
MCHC: 30.7 g/dL — ABNORMAL LOW (ref 32.0–36.0)
MCV: 80.7 fL (ref 80.0–100.0)
MPV: 10.1 fL (ref 7.5–12.5)
Monocytes Absolute: 504 cells/uL (ref 200–950)
Monocytes Relative: 7 %
Neutro Abs: 4464 cells/uL (ref 1500–7800)
Neutrophils Relative %: 62 %
PLATELETS: 288 10*3/uL (ref 140–400)
RBC: 3.99 MIL/uL (ref 3.80–5.10)
RDW: 14.6 % (ref 11.0–15.0)
WBC: 7.2 10*3/uL (ref 3.8–10.8)

## 2016-07-20 LAB — IRON AND TIBC
%SAT: 8 % — ABNORMAL LOW (ref 11–50)
Iron: 35 ug/dL — ABNORMAL LOW (ref 45–160)
TIBC: 416 ug/dL (ref 250–450)
UIBC: 381 ug/dL (ref 125–400)

## 2016-07-20 LAB — FERRITIN: Ferritin: 6 ng/mL — ABNORMAL LOW (ref 20–288)

## 2016-07-27 NOTE — Progress Notes (Signed)
Pt's appt is 08/18/2016 with Hassell Halim.

## 2016-07-27 NOTE — Progress Notes (Signed)
Still with significant IDA. Hgb actually down from outside labs. When is hematology appt?

## 2016-08-18 ENCOUNTER — Encounter (HOSPITAL_COMMUNITY): Payer: Medicare Other

## 2016-08-18 ENCOUNTER — Encounter (HOSPITAL_COMMUNITY): Payer: Medicare Other | Attending: Oncology | Admitting: Oncology

## 2016-08-18 ENCOUNTER — Encounter (HOSPITAL_COMMUNITY): Payer: Self-pay | Admitting: Oncology

## 2016-08-18 VITALS — BP 142/53 | HR 66 | Temp 98.0°F | Resp 20 | Ht 61.0 in | Wt 208.0 lb

## 2016-08-18 DIAGNOSIS — K922 Gastrointestinal hemorrhage, unspecified: Secondary | ICD-10-CM | POA: Diagnosis not present

## 2016-08-18 DIAGNOSIS — D500 Iron deficiency anemia secondary to blood loss (chronic): Secondary | ICD-10-CM | POA: Insufficient documentation

## 2016-08-18 LAB — CBC WITH DIFFERENTIAL/PLATELET
BASOS ABS: 0.1 10*3/uL (ref 0.0–0.1)
Basophils Relative: 1 %
EOS ABS: 0.3 10*3/uL (ref 0.0–0.7)
EOS PCT: 3 %
HCT: 33.8 % — ABNORMAL LOW (ref 36.0–46.0)
Hemoglobin: 10.4 g/dL — ABNORMAL LOW (ref 12.0–15.0)
Lymphocytes Relative: 32 %
Lymphs Abs: 2.6 10*3/uL (ref 0.7–4.0)
MCH: 24.9 pg — AB (ref 26.0–34.0)
MCHC: 30.8 g/dL (ref 30.0–36.0)
MCV: 80.9 fL (ref 78.0–100.0)
MONO ABS: 0.4 10*3/uL (ref 0.1–1.0)
Monocytes Relative: 5 %
Neutro Abs: 4.8 10*3/uL (ref 1.7–7.7)
Neutrophils Relative %: 59 %
PLATELETS: 269 10*3/uL (ref 150–400)
RBC: 4.18 MIL/uL (ref 3.87–5.11)
RDW: 13.8 % (ref 11.5–15.5)
WBC: 8.2 10*3/uL (ref 4.0–10.5)

## 2016-08-18 LAB — LACTATE DEHYDROGENASE: LDH: 95 U/L — ABNORMAL LOW (ref 98–192)

## 2016-08-18 LAB — SEDIMENTATION RATE: Sed Rate: 35 mm/hr — ABNORMAL HIGH (ref 0–22)

## 2016-08-18 LAB — BASIC METABOLIC PANEL
ANION GAP: 9 (ref 5–15)
BUN: 12 mg/dL (ref 6–20)
CALCIUM: 9.3 mg/dL (ref 8.9–10.3)
CO2: 30 mmol/L (ref 22–32)
Chloride: 100 mmol/L — ABNORMAL LOW (ref 101–111)
Creatinine, Ser: 0.53 mg/dL (ref 0.44–1.00)
GFR calc Af Amer: >60 mL/min (ref >60)
Glucose, Bld: 122 mg/dL — ABNORMAL HIGH (ref 65–99)
POTASSIUM: 3.7 mmol/L (ref 3.5–5.1)
SODIUM: 139 mmol/L (ref 135–145)

## 2016-08-18 LAB — RETICULOCYTES
RBC.: 4.18 MIL/uL (ref 3.87–5.11)
Retic Count, Absolute: 79.4 10*3/uL (ref 19.0–186.0)
Retic Ct Pct: 1.9 % (ref 0.4–3.1)

## 2016-08-18 LAB — FERRITIN: Ferritin: 5 ng/mL — ABNORMAL LOW (ref 11–307)

## 2016-08-18 LAB — VITAMIN B12: VITAMIN B 12: 441 pg/mL (ref 180–914)

## 2016-08-18 LAB — C-REACTIVE PROTEIN

## 2016-08-18 LAB — IRON AND TIBC
IRON: 21 ug/dL — AB (ref 28–170)
SATURATION RATIOS: 5 % — AB (ref 10.4–31.8)
TIBC: 451 ug/dL — AB (ref 250–450)
UIBC: 430 ug/dL

## 2016-08-18 LAB — FOLATE: FOLATE: 23.8 ng/mL (ref >5.9)

## 2016-08-18 NOTE — Progress Notes (Signed)
[facility] Hematology/Oncology Consultation   Name: Kristina Berry      MRN: [id number]    Location: Room/bed info not found  Date: 08/18/2016 Time:12:19 PM   REFERRING PHYSICIAN:  Barney Drain, MD (GI)  REASON FOR CONSULT:  Anemia   DIAGNOSIS:  Normocytic, hypochromic anemia with preservation of WBC and platelet count with a low serum iron and saturation, high-normal TIBC, and low ferritin  HISTORY OF PRESENT ILLNESS:   Kristina Berry is a 71 y.o. female with a medical history significant for dyslipidemia, diabetes mellitus, insulin-dependent, hypertension, depression, GERD, obesity, grade 2 diastolic dysfunction with left atrial enlargement and mild mitral regurgitation,who is referred to the [facility] for anemia.  She reports that her primary care physician, Dr. Pleas Koch, noted that her iron level was decreasing.  As result, she was referred to GI.  She has undergone a complete GI workup including EGD/colonoscopy on 05/28/2016 and a camera capsule study on 06/23/2016.  She is status post EGD/colonoscopy on 05/28/2016 by Dr. Oneida Alar demonstrating moderate gastritis, benign gastric polyps, diverticulosis in the rectosigmoid colon, sigmoid colon, and descending colon, 8 mm polyp in the proximal ascending colon, and external and internal hemorrhoids.  No source for anemia identified with EGD/colonoscopy.  She then underwent a camera capsule study on 06/23/2016 demonstrating occasional erosions in the ileum.  She is on an iron tablet, ferrous sulfate, 325 mg.  She takes it twice a day but admittedly is poorly compliant.  She notes that it does cause constipation.  She denies any nausea, vomiting, diarrhea, or abdominal pain associated with ferrous sulfate.  She denies any blood in her stools or black stools.  She does admit to dark stools when she does take her iron tablets.  She denies any gross hematuria, hemoptysis, hematemesis.  She denies any epistaxis or  gingival bleeding.  She denies any pica or pagophagia.  She reports her appetite is 75%.  Her energy level is 50-75%.  She denies any pain.  She is educated about iron deficiency anemia.  Review of Systems  Constitutional: Positive for malaise/fatigue. Negative for chills, fever and weight loss.  HENT: Negative.   Eyes: Negative.   Respiratory: Negative.  Negative for cough.   Cardiovascular: Negative.  Negative for chest pain.  Gastrointestinal: Negative.  Negative for blood in stool, constipation, diarrhea, melena, nausea and vomiting.  Genitourinary: Negative.   Musculoskeletal: Negative.   Skin: Negative.   Neurological: Negative.  Negative for weakness.  Endo/Heme/Allergies: Negative.   Psychiatric/Behavioral: Negative.      PAST MEDICAL HISTORY:   Past Medical History:  Diagnosis Date  . Anemia   . Anxiety   . Baker's cyst of knee   . Body mass index (BMI) of 36.0 to 36.9 in adult   . CHF (congestive heart failure) ([place])   . Chronic low back pain   . Diastolic dysfunction   . Essential hypertension   . GERD (gastroesophageal reflux disease)   . Heart murmur   . Hyperlipidemia   . Iron deficiency anemia 05/18/2016  . LBBB (left bundle branch block)   . Mitral regurgitation    Mild to moderate 2011  . Skin cancer    basal cell removed from face/melanoma removed from rt arm  . Type 2 diabetes mellitus (HCC)     ALLERGIES: Allergies  Allergen Reactions  . Cefdinir Other (See Comments)    Stomach cramps  . Nsaids Other (See Comments)  Stomach pain  . Sulfa Antibiotics Nausea Only      MEDICATIONS: I have reviewed the patient's current medications.    Current Outpatient Prescriptions on File Prior to Visit  Medication Sig Dispense Refill  . aspirin 81 MG tablet Take 1 tablet by mouth daily.    Marland Kitchen atorvastatin (LIPITOR) 20 MG tablet Take 1 tablet by mouth daily.    . furosemide (LASIX) 40 MG tablet Take 40 mg by mouth 2 (two) times daily.     . insulin  glargine (LANTUS) 100 UNIT/ML injection Inject 20 Units into the skin at bedtime.    . irbesartan (AVAPRO) 150 MG tablet Take 1 tablet by mouth daily.    . metFORMIN (GLUCOPHAGE) 1000 MG tablet Take 1 tablet by mouth 2 (two) times daily.    Marland Kitchen omeprazole (PRILOSEC) 20 MG capsule Take 1 capsule by mouth daily.    Marland Kitchen PARoxetine (PAXIL) 10 MG tablet Take 0.5 tablets by mouth daily.    Marland Kitchen diltiazem (CARDIZEM CD) 240 MG 24 hr capsule Take 1 capsule (240 mg total) by mouth daily. 90 capsule 3  . sitaGLIPtin (JANUVIA) 100 MG tablet Take 1 tablet by mouth daily.     No current facility-administered medications on file prior to visit.      PAST SURGICAL HISTORY Past Surgical History:  Procedure Laterality Date  . ADENOIDECTOMY    . BACK SURGERY  05/2014  . BIOPSY  05/28/2016   Procedure: BIOPSY;  Surgeon: Danie Binder, MD;  [facility];  Service: Endoscopy;;  duodenal and gastric  . CHOLECYSTECTOMY  2009  . COLONOSCOPY N/A 05/28/2016   Procedure: COLONOSCOPY;  Surgeon: Danie Binder, MD;  [facility];  Service: Endoscopy;  Laterality: N/A;  10:00 am  . ESOPHAGOGASTRODUODENOSCOPY N/A 05/28/2016   Procedure: ESOPHAGOGASTRODUODENOSCOPY (EGD);  Surgeon: Danie Binder, MD;  [facility];  Service: Endoscopy;  Laterality: N/A;  . GIVENS CAPSULE STUDY N/A 06/23/2016   Procedure: GIVENS CAPSULE STUDY;  Surgeon: Danie Binder, MD;  [facility];  Service: Endoscopy;  Laterality: N/A;  0800  . MELANOMA SURGERY Right   . POLYPECTOMY  05/28/2016   Procedure: POLYPECTOMY;  Surgeon: Danie Binder, MD;  [facility];  Service: Endoscopy;;  ascending colon   . SKIN CANCER EXCISION     Basal cell removal from left side of face  . TONSILLECTOMY      FAMILY HISTORY: Family History  Problem Relation Age of Onset  . Osteoporosis Mother   . Dementia Mother   . Diabetes Mother   . Breast cancer Sister   . Heart Problems Father   . Diabetes Father   .  Breast cancer Sister   . COPD Sister   . Colon cancer Neg Hx     SOCIAL HISTORY:  reports that she has never smoked. She has never used smokeless tobacco. She reports that she does not drink alcohol or use drugs.  Social History   Social History  . Marital status: Married    Spouse name: N/A  . Number of children: N/A  . Years of education: N/A   Social History Main Topics  . Smoking status: Never Smoker  . Smokeless tobacco: Never Used  . Alcohol use No  . Drug use: No  . Sexual activity: Not Asked   Other Topics Concern  . None   Social History Narrative  . None    PERFORMANCE STATUS: The patient's performance status is 1 -  Symptomatic but completely ambulatory  PHYSICAL EXAM: Most Recent Vital Signs: Blood pressure (!) 142/53, pulse 66, temperature 98 F (36.7 C), temperature source Oral, resp. rate 20, height _0  (1.549 m), weight 208 lb (94.3 kg), SpO2 98 %. BP (!) 142/53 (BP Location: Right Arm, Patient Position: Sitting)   Pulse 66   Temp 98 F (36.7 C) (Oral)   Resp 20   Ht _1  (1.549 m)   Wt 208 lb (94.3 kg)   SpO2 98%   BMI 39.30 kg/m   General Appearance:    Alert, cooperative, no distress, appears stated age, obese, unaccompanied  Head:    Normocephalic, without obvious abnormality, atraumatic  Eyes:    Conjunctiva/corneas clear, EOM's intact, both eyes  Ears:    Not examined  Nose:   Not examined  Throat:   Lips, mucosa, and tongue normal  Neck:   Supple, symmetrical, trachea midline, no adenopathy.  Back:     Symmetric, no curvature, ROM normal, no CVA tenderness  Lungs:     Clear to auscultation bilaterally, respirations unlabored  Chest Wall:    No tenderness or deformity   Heart:    Regular rate and rhythm, S1 and S2 normal, soft systolic murmur 1/6.  Breast Exam:    Not examined  Abdomen:     Soft, non-tender, bowel sounds active all four quadrants,    obese  Genitalia:    Not examined  Rectal:    Not examined  Extremities:    Atraumatic, no cyanosis.  Pulses:  Not examined  Skin:   Skin color, texture, turgor normal, no rashes or lesions, pale  Lymph nodes:   Cervical, supraclavicular, and axillary nodes normal  Neurologic:   CNII-XII intact, normal strength, sensation and reflexes    throughout    LABORATORY DATA:  CBC    Component Value Date/Time   WBC 7.2 07/20/2016 1129   RBC 3.99 07/20/2016 1129   HGB 9.9 (L) 07/20/2016 1129   HCT 32.2 (L) 07/20/2016 1129   PLT 288 07/20/2016 1129   MCV 80.7 07/20/2016 1129   MCH 24.8 (L) 07/20/2016 1129   MCHC 30.7 (L) 07/20/2016 1129   RDW 14.6 07/20/2016 1129   LYMPHSABS 2,016 07/20/2016 1129   MONOABS 504 07/20/2016 1129   EOSABS 216 07/20/2016 1129   BASOSABS 0 07/20/2016 1129     Chemistry   No results found for: NA, K, CL, CO2, BUN, CREATININE, GLU No results found for: CALCIUM, ALKPHOS, AST, ALT, BILITOT   Lab Results  Component Value Date   IRON 35 (L) 07/20/2016   TIBC 416 07/20/2016   FERRITIN 6 (L) 07/20/2016      RADIOGRAPHY: No results found.     PATHOLOGY:  N/A  ASSESSMENT/PLAN:   Iron deficiency anemia Iron deficiency anemia secondary to GI blood loss from ileal erosions versus malabsorption.  Camera capsule study on 06/23/2016 demonstrated ileal erosions and most recent EGD/colonoscopy completed by Dr. Oneida Alar on 05/28/2016.  She has intolerance to ferrous sulfate due to constipation.  I personally reviewed and went over laboratory results with the patient.  The results are noted within this dictation.  Labs today: CBC diff, BMET, anemia panel, retic count, LDH, ESR, CRP, haptoglobin, EPO level, pathology smear review.  Due to her age and anemia, will screen for multiple myeloma: SPEP + IFE and light chain assay.  Based upon labs in April 2018, her calculated iron deficit is 830 mg.  I will get her set-up for Injectafer x 1.  I have reviewed the risks, benefits, alternatives, and side effects of Injectafer (ferric carboxymaltose)  including, but not limited to, reaction at injection site, anaphylaxis, back pain, hypophosphatemia, increase in blood pressure, flushing, hypotension, skin discoloration at injection site, nausea, vomiting.  I personally reviewed and went over pathology results with the patient.  Pathology from polypectomy was negative for any dysplasia or malignancy.  Labs in 6 weeks: CBC diff, iron/TIBC, ferritin.  Patient is provided education regarding iron deficiency anemia.    Iron deficiency anemia is the most common anemia.  Beside playing a critical role as an oxygen carrier in the heme group of hemoglobin, iron is found in many key proteins in the cells, such as cytochromes and myoglobin, so it is not unexpected that a lack of iron has effects other than anemia.  Three studies have focused on nonanemic iron deficiency leading to fatigue.  Two studies showed that oral iron supplementation reduces fatigue, with no significant change in hemoglobin levels, in women with a ferritin level of less than 50 ng/mL, and a third study showed a lessening of fatigue with parental iron administration in women with a ferritin level of 15 ng/mL or less or an iron saturation of 20% or less.   Owing to obligate iron loss through menses, women are at greater risk for iron deficiency than men.  Iron loss in all women averages 1-3 ng per day, and dietary intake is often inadequate to maintain a positive iron balance.  A 1967 study showed that 25% of healthy, college-age women had no bone marrow iron stores and that another 33% had low stores.  Pregnancy adds to demands for iron, with requirements increasing to 6 ng per day by the end of pregnancy.  Athletes are another group at risk for iron deficiency.  Gastrointestinal tract blood is the source of iron loss, and exercise-induced hemolysis leads to urinary iron losses.  Decreased absorption of iron has also been implicated as a cause of iron deficiency, because of levels of  hepcidin are often elevated in athletes owing to training-induced inflammation.    Obesity and its surgical treatment are also at risk factors for iron deficiency.  Obese patients are often iron-deficient, with increased hepcidin level being implicated in decreased absorption.  After bariatric surgery, the incidence of iron deficiency can be as high as 50%.  Because the main site of iron absorption is the duodenum, surgeries that involve bypassing this part of the bowel are associated with an increased incidence of iron deficiency.  However, iron deficiency is seen as a sequela of most types of bariatric surgery.    -NEJM Volume 371, No 14, pg 1325-1326  Return in 6 weeks for follow-up.  Once iron is replaced, we can consider ferrous forte to maintain iron stores and evaluate tolerance to Rx-strength iron.   ORDERS PLACED FOR THIS ENCOUNTER: Orders Placed This Encounter  Procedures  . CBC with Differential  . Basic metabolic panel  . Pathologist smear review  . Lactate dehydrogenase  . Sedimentation rate  . Vitamin B12  . Folate  . Iron and TIBC  . Ferritin  . Erythropoietin  . Haptoglobin  . Reticulocytes  . C-reactive protein  . CBC with Differential  . Iron and TIBC  . Ferritin  . Kappa/lambda light chains  . IgG, IgA, IgM  . Protein electrophoresis, serum  . Immunofixation electrophoresis    MEDICATIONS PRESCRIBED THIS ENCOUNTER: Meds ordered this encounter  Medications  . ferrous sulfate 325 (65 FE) MG tablet  Sig: Take 325 mg by mouth 2 (two) times daily with a meal.    All questions were answered. The patient knows to call the clinic with any problems, questions or concerns. We can certainly see the patient much sooner if necessary.  Patient discussed with Dr. Talbert Cage and together we ascertained an up-to-date interval history, and examined the patient.  Dr. Talbert Cage developed the patient's assessment and plan.  This was a shared visit-consultation.  Her attestation will  follow below.  This note is electronically signed by: Doy Mince 08/18/2016 12:19 PM

## 2016-08-18 NOTE — Assessment & Plan Note (Addendum)
Iron deficiency anemia secondary to GI blood loss from ileal erosions versus malabsorption.  Camera capsule study on 06/23/2016 demonstrated ileal erosions and most recent EGD/colonoscopy completed by Dr. Oneida Alar on 05/28/2016.  She has intolerance to ferrous sulfate due to constipation.  I personally reviewed and went over laboratory results with the patient.  The results are noted within this dictation.  Labs today: CBC diff, BMET, anemia panel, retic count, LDH, ESR, CRP, haptoglobin, EPO level, pathology smear review.  Due to her age and anemia, will screen for multiple myeloma: SPEP + IFE and light chain assay.  Based upon labs in April 2018, her calculated iron deficit is 830 mg.  I will get her set-up for Injectafer x 1.  I have reviewed the risks, benefits, alternatives, and side effects of Injectafer (ferric carboxymaltose) including, but not limited to, reaction at injection site, anaphylaxis, back pain, hypophosphatemia, increase in blood pressure, flushing, hypotension, skin discoloration at injection site, nausea, vomiting.  I personally reviewed and went over pathology results with the patient.  Pathology from polypectomy was negative for any dysplasia or malignancy.  Labs in 6 weeks: CBC diff, iron/TIBC, ferritin.  Patient is provided education regarding iron deficiency anemia.    Iron deficiency anemia is the most common anemia.  Beside playing a critical role as an oxygen carrier in the heme group of hemoglobin, iron is found in many key proteins in the cells, such as cytochromes and myoglobin, so it is not unexpected that a lack of iron has effects other than anemia.  Three studies have focused on nonanemic iron deficiency leading to fatigue.  Two studies showed that oral iron supplementation reduces fatigue, with no significant change in hemoglobin levels, in women with a ferritin level of less than 50 ng/mL, and a third study showed a lessening of fatigue with parental iron  administration in women with a ferritin level of 15 ng/mL or less or an iron saturation of 20% or less.   Owing to obligate iron loss through menses, women are at greater risk for iron deficiency than men.  Iron loss in all women averages 1-3 ng per day, and dietary intake is often inadequate to maintain a positive iron balance.  A 1967 study showed that 25% of healthy, college-age women had no bone marrow iron stores and that another 33% had low stores.  Pregnancy adds to demands for iron, with requirements increasing to 6 ng per day by the end of pregnancy.  Athletes are another group at risk for iron deficiency.  Gastrointestinal tract blood is the source of iron loss, and exercise-induced hemolysis leads to urinary iron losses.  Decreased absorption of iron has also been implicated as a cause of iron deficiency, because of levels of hepcidin are often elevated in athletes owing to training-induced inflammation.    Obesity and its surgical treatment are also at risk factors for iron deficiency.  Obese patients are often iron-deficient, with increased hepcidin level being implicated in decreased absorption.  After bariatric surgery, the incidence of iron deficiency can be as high as 50%.  Because the main site of iron absorption is the duodenum, surgeries that involve bypassing this part of the bowel are associated with an increased incidence of iron deficiency.  However, iron deficiency is seen as a sequela of most types of bariatric surgery.    -NEJM Volume 371, No 14, pg 1325-1326  Return in 6 weeks for follow-up.  Once iron is replaced, we can consider ferrous forte to maintain iron stores  and evaluate tolerance to Rx-strength iron.

## 2016-08-18 NOTE — Patient Instructions (Addendum)
[place] at [facility] Discharge Instructions  RECOMMENDATIONS MADE BY THE CONSULTANT AND ANY TEST RESULTS WILL BE SENT TO YOUR REFERRING PHYSICIAN.  Labs today  Injecatafer x 1 dose next week  Labs in 6 weeks  Return in 6 weeks for follow up appointment  Thank you for choosing [place] at [facility] to provide your oncology and hematology care.  To afford each patient quality time with our provider, please arrive at least 15 minutes before your scheduled appointment time.    If you have a lab appointment with the [facility] please come in thru the  Main Entrance and check in at the main information desk  You need to re-schedule your appointment should you arrive 10 or more minutes late.  We strive to give you quality time with our providers, and arriving late affects you and other patients whose appointments are after yours.  Also, if you no show three or more times for appointments you may be dismissed from the clinic at the providers discretion.     Again, thank you for choosing [facility]  Our hope is that these requests will decrease the amount of time that you wait before being seen by our physicians.       _____________________________________________________________  Should you have questions after your visit to [facility], please contact our office at [phone] between the hours of 8:30 a.m. and 4:30 p.m.  Voicemails left after 4:30 p.m. will not be returned until the following business day.  For prescription refill requests, have your pharmacy contact our office.       Resources For Cancer Patients and their Caregivers ? American Cancer Society: Can assist with transportation, wigs, general needs, runs Look Good Feel Better.        581-209-7316 ? Cancer Care: Provides financial assistance, online support groups, medication/co-pay assistance.  1-800-813-HOPE (201)775-7250) ? [place] Assists [place] cancer patients and their families through emotional , educational and financial support.  706-261-4895 ? [place] DSS Where to apply for food stamps, Medicaid and utility assistance. (720) 488-1160 ? [employer]: Transportation to medical appointments. 380-052-3637 ? Social Security Administration: May apply for disability if have a Stage IV cancer. 531-057-9060 6016425915 ? [employer], Disability and Transit Services: Assists with nutrition, care and transit needs. [place] Support Programs: @10RELATIVEDAYS @ > Cancer Support Group  2nd Tuesday of the month 1pm-2pm, Journey Room  > Creative Journey  3rd Tuesday of the month 1130am-1pm, Journey Room  > Look Good Feel Better  1st Wednesday of the month 10am-12 noon, Journey Room (Call [place] to register [phone])

## 2016-08-19 LAB — KAPPA/LAMBDA LIGHT CHAINS
Kappa free light chain: 19.8 mg/L — ABNORMAL HIGH (ref 3.3–19.4)
Kappa, lambda light chain ratio: 1.22 (ref 0.26–1.65)
Lambda free light chains: 16.2 mg/L (ref 5.7–26.3)

## 2016-08-19 LAB — IGG, IGA, IGM
IGG (IMMUNOGLOBIN G), SERUM: 745 mg/dL (ref 700–1600)
IGM, SERUM: 107 mg/dL (ref 26–217)
IgA: 196 mg/dL (ref 64–422)

## 2016-08-19 LAB — PROTEIN ELECTROPHORESIS, SERUM
A/G Ratio: 1.1 (ref 0.7–1.7)
ALPHA-1-GLOBULIN: 0.3 g/dL (ref 0.0–0.4)
Albumin ELP: 3.6 g/dL (ref 2.9–4.4)
Alpha-2-Globulin: 1 g/dL (ref 0.4–1.0)
Beta Globulin: 1.1 g/dL (ref 0.7–1.3)
GLOBULIN, TOTAL: 3.2 g/dL (ref 2.2–3.9)
Gamma Globulin: 0.8 g/dL (ref 0.4–1.8)
TOTAL PROTEIN ELP: 6.8 g/dL (ref 6.0–8.5)

## 2016-08-19 LAB — ERYTHROPOIETIN: ERYTHROPOIETIN: 28.9 m[IU]/mL — AB (ref 2.6–18.5)

## 2016-08-19 LAB — HAPTOGLOBIN: HAPTOGLOBIN: 231 mg/dL — AB (ref 34–200)

## 2016-08-20 LAB — PATHOLOGIST SMEAR REVIEW

## 2016-08-25 LAB — IMMUNOFIXATION ELECTROPHORESIS
IGA: 192 mg/dL (ref 64–422)
IGG (IMMUNOGLOBIN G), SERUM: 757 mg/dL (ref 700–1600)
IGM, SERUM: 107 mg/dL (ref 26–217)
TOTAL PROTEIN ELP: 6.8 g/dL (ref 6.0–8.5)

## 2016-08-26 ENCOUNTER — Encounter (HOSPITAL_BASED_OUTPATIENT_CLINIC_OR_DEPARTMENT_OTHER): Payer: Medicare Other

## 2016-08-26 VITALS — BP 129/54 | HR 71 | Temp 98.3°F | Resp 16

## 2016-08-26 DIAGNOSIS — D500 Iron deficiency anemia secondary to blood loss (chronic): Secondary | ICD-10-CM | POA: Diagnosis not present

## 2016-08-26 DIAGNOSIS — K922 Gastrointestinal hemorrhage, unspecified: Secondary | ICD-10-CM

## 2016-08-26 MED ORDER — SODIUM CHLORIDE 0.9 % IV SOLN
INTRAVENOUS | Status: DC
  Administered 2016-08-26: 14:00:00 via INTRAVENOUS

## 2016-08-26 MED ORDER — SODIUM CHLORIDE 0.9 % IV SOLN
750.0000 mg | Freq: Once | INTRAVENOUS | Status: AC
  Administered 2016-08-26: 750 mg via INTRAVENOUS
  Filled 2016-08-26: qty 15

## 2016-08-26 NOTE — Patient Instructions (Signed)
[place] at [facility] Discharge Instructions  RECOMMENDATIONS MADE BY THE CONSULTANT AND ANY TEST RESULTS WILL BE SENT TO YOUR REFERRING PHYSICIAN.  Today you received injectafer 750 mg iron infusion.  Thank you for choosing [place] at [facility] to provide your oncology and hematology care.  To afford each patient quality time with our provider, please arrive at least 15 minutes before your scheduled appointment time.    If you have a lab appointment with the [facility] please come in thru the  Main Entrance and check in at the main information desk  You need to re-schedule your appointment should you arrive 10 or more minutes late.  We strive to give you quality time with our providers, and arriving late affects you and other patients whose appointments are after yours.  Also, if you no show three or more times for appointments you may be dismissed from the clinic at the providers discretion.     Again, thank you for choosing [facility].  Our hope is that these requests will decrease the amount of time that you wait before being seen by our physicians.       _____________________________________________________________  Should you have questions after your visit to [facility], please contact our office at [phone] between the hours of 8:30 a.m. and 4:30 p.m.  Voicemails left after 4:30 p.m. will not be returned until the following business day.  For prescription refill requests, have your pharmacy contact our office.       Resources For Cancer Patients and their Caregivers ? American Cancer Society: Can assist with transportation, wigs, general needs, runs Look Good Feel Better.        2547973142 ? Cancer Care: Provides financial assistance, online support groups, medication/co-pay assistance.  1-800-813-HOPE 820-008-8237) ? [place] Assists [place] cancer patients  and their families through emotional , educational and financial support.  (250)431-3151 ? [place] DSS Where to apply for food stamps, Medicaid and utility assistance. 613-490-5072 ? [employer]: Transportation to medical appointments. 6064357659 ? Social Security Administration: May apply for disability if have a Stage IV cancer. (720)398-8545 650-757-3500 ? [employer], Disability and Transit Services: Assists with nutrition, care and transit needs. [place] Support Programs: @10RELATIVEDAYS @ > Cancer Support Group  2nd Tuesday of the month 1pm-2pm, Journey Room  > Creative Journey  3rd Tuesday of the month 1130am-1pm, Journey Room  > Look Good Feel Better  1st Wednesday of the month 10am-12 noon, Journey Room (Call [place] to register [phone])

## 2016-08-26 NOTE — Progress Notes (Signed)
Tolerated infusion well. Stable and ambulatory on discharge home with husband.

## 2016-09-08 NOTE — Progress Notes (Signed)
08/18/16: Patient got on scales to have height checked and put too much weight on right side of walker, walker moved, and patient began to descend sideways. Patient was caught by Lupita Raider RN and got herself straightened up and balanced. Patient did not hit the floor or any other object. Patient fine after incident. Patient said that she thought that we should remove the walkers from the scales.  Post fall flowsheet completed.

## 2016-09-08 NOTE — Progress Notes (Signed)
   08/18/16 1105  What Happened  Was fall witnessed? Yes (pt got off balanced, descended sideways, caught by rn)  Who witnessed fall? Lupita Raider RN)  Patients activity before fall other (comment) (getting on scale for height check)  Point of contact other (comment) (pt never touched the floor)  Was patient injured? No  Follow Up  MD notified (Tom Kefalas PA-C)  Time MD notified [phone]  Family notified (no, pt was only person present)  Additional tests No  Simple treatment (no tx needed - pt didn't fall to the floor)  Progress note created (see row info) Yes  Adult Fall Risk Assessment  Risk Factor Category (scoring not indicated) Fall has occurred during this admission (document High fall risk) (got unbalanced, descended sideways, caught by [place])  Adult Fall Risk Interventions  Required Bundle Interventions *See Row Information* High fall risk - low, moderate, and high requirements implemented  Additional Interventions (apply yellow arm band for subsequent visits to clinic)  Vitals  Temp 98 F (36.7 C)  BP (!) 142/53  Pulse Rate 66  Resp 20  Oxygen Therapy  SpO2 98 %  Pain Assessment  Pain Assessment No/denies pain  Pain Score 0  Neurological  Neuro (WDL) WDL  Musculoskeletal  Musculoskeletal (WDL) WDL  Integumentary  Integumentary (WDL) WDL

## 2016-09-09 ENCOUNTER — Ambulatory Visit (HOSPITAL_COMMUNITY): Payer: Medicare Other

## 2016-09-09 ENCOUNTER — Ambulatory Visit (HOSPITAL_COMMUNITY): Payer: Medicare Other | Admitting: Adult Health

## 2016-09-09 ENCOUNTER — Other Ambulatory Visit (HOSPITAL_COMMUNITY): Payer: Medicare Other

## 2016-09-28 ENCOUNTER — Ambulatory Visit (INDEPENDENT_AMBULATORY_CARE_PROVIDER_SITE_OTHER): Payer: Medicare Other | Admitting: Gastroenterology

## 2016-09-28 ENCOUNTER — Encounter: Payer: Self-pay | Admitting: Gastroenterology

## 2016-09-28 VITALS — BP 133/65 | HR 77 | Temp 97.7°F | Ht 62.0 in | Wt 207.2 lb

## 2016-09-28 DIAGNOSIS — D508 Other iron deficiency anemias: Secondary | ICD-10-CM | POA: Diagnosis not present

## 2016-09-28 NOTE — Patient Instructions (Signed)
We will see you back in 1 year!  I am glad you are doing better!

## 2016-09-28 NOTE — Assessment & Plan Note (Signed)
71 year old with thorough evaluation to include colonoscopy, EGD, and capsule study noting ileal erosions. IDA secondary to chronic blood loss from erosions vs malabsorption. Followed by Hematology. From a GI standpoint, she is doing quite well. Will have her return in 1 year. Continue to follow with hematology. Next colonoscopy in 5-10 years if benefits outweigh the risks.

## 2016-09-28 NOTE — Progress Notes (Signed)
Referring Provider: Curlene Labrum, MD Primary Care Physician:  Curlene Labrum, MD Primary GI: Dr. Oneida Alar   Chief Complaint  Patient presents with  . Anemia    f/u, goes to cancer center for infusion; some tiredness    HPI:   Kristina Berry is a 71 y.o. female presenting today with a history of IDA, recently undergoing EGD, colonoscopy, and capsule study. EGD with gastritis, colonoscopy as noted below. Capsule study with mild ileitis. Will need colonoscopy in 5-10 years if benefits outweigh the risks. Referred to Hematology for IDA. Has been seen by hematology and underwent an iron infusion 08/26/16. Here for routine follow-up.   Some fatigue noted but states "she was running around a lot yesterday". Feels overall better since the infusion. States she developed itching after the iron infusion approximately a week later so she took Benadryl. States she was itching on her back, head, and arms. Goes back to Hematology the 27th. No rectal bleeding. Takes Prilosec once daily with good control of GERD. Rare constipation.   Past Medical History:  Diagnosis Date  . Anemia   . Anxiety   . Baker's cyst of knee   . Body mass index (BMI) of 36.0 to 36.9 in adult   . CHF (congestive heart failure) ([place])   . Chronic low back pain   . Diastolic dysfunction   . Essential hypertension   . GERD (gastroesophageal reflux disease)   . Heart murmur   . Hyperlipidemia   . Iron deficiency anemia 05/18/2016  . LBBB (left bundle branch block)   . Mitral regurgitation    Mild to moderate 2011  . Skin cancer    basal cell removed from face/melanoma removed from rt arm  . Type 2 diabetes mellitus ([place])     Past Surgical History:  Procedure Laterality Date  . ADENOIDECTOMY    . BACK SURGERY  05/2014  . BIOPSY  05/28/2016   Procedure: BIOPSY;  Surgeon: Danie Binder, MD;  [facility];  Service: Endoscopy;;  duodenal and gastric  . CHOLECYSTECTOMY  2009  . COLONOSCOPY N/A  05/28/2016   Dr. Oneida Alar: one 8 mm tubular adenomas, diverticulosis in recto-sigmoid, sigmoid, and descending. Colonoscopy in 5-10 years if benefits outweigh the risks  . ESOPHAGOGASTRODUODENOSCOPY N/A 05/28/2016   Dr. Oneida Alar: multiple benign-appearing gastric polyps, gastritis  . GIVENS CAPSULE STUDY N/A 06/23/2016   Dr. Oneida Alar: mild ileitis  . MELANOMA SURGERY Right   . POLYPECTOMY  05/28/2016   Procedure: POLYPECTOMY;  Surgeon: Danie Binder, MD;  [facility];  Service: Endoscopy;;  ascending colon   . SKIN CANCER EXCISION     Basal cell removal from left side of face  . TONSILLECTOMY      Current Outpatient Prescriptions  Medication Sig Dispense Refill  . aspirin 81 MG tablet Take 1 tablet by mouth daily.    Marland Kitchen atorvastatin (LIPITOR) 20 MG tablet Take 1 tablet by mouth daily.    . Cholecalciferol (VITAMIN D3) 2000 units TABS Take 1 tablet by mouth daily.    Marland Kitchen diltiazem (CARDIZEM CD) 240 MG 24 hr capsule Take 1 capsule (240 mg total) by mouth daily. 90 capsule 3  . furosemide (LASIX) 40 MG tablet Take 40 mg by mouth 2 (two) times daily.     . insulin glargine (LANTUS) 100 UNIT/ML injection Inject 20 Units into the skin at bedtime.    . irbesartan (AVAPRO) 150 MG tablet Take 1 tablet by mouth  daily.    . loratadine (CLARITIN) 10 MG tablet Take 10 mg by mouth daily.    . metFORMIN (GLUCOPHAGE) 1000 MG tablet Take 1 tablet by mouth 2 (two) times daily.    Marland Kitchen omeprazole (PRILOSEC) 20 MG capsule Take 1 capsule by mouth daily.    Marland Kitchen PARoxetine (PAXIL) 10 MG tablet Take 0.5 tablets by mouth daily.    . sitaGLIPtin (JANUVIA) 100 MG tablet Take 1 tablet by mouth daily.    . ferrous sulfate 325 (65 FE) MG tablet Take 325 mg by mouth 2 (two) times daily with a meal.     No current facility-administered medications for this visit.     Allergies as of 09/28/2016 - Review Complete 09/28/2016  Allergen Reaction Noted  . Cefdinir Other (See Comments) 10/24/2015  . Nsaids Other (See  Comments) 10/24/2015  . Sulfa antibiotics Nausea Only 10/24/2015    Family History  Problem Relation Age of Onset  . Osteoporosis Mother   . Dementia Mother   . Diabetes Mother   . Breast cancer Sister   . Heart Problems Father   . Diabetes Father   . Breast cancer Sister   . COPD Sister   . Colon cancer Neg Hx     Social History   Social History  . Marital status: Married    Spouse name: N/A  . Number of children: N/A  . Years of education: N/A   Social History Main Topics  . Smoking status: Never Smoker  . Smokeless tobacco: Never Used  . Alcohol use No  . Drug use: No  . Sexual activity: Not Asked   Other Topics Concern  . None   Social History Narrative  . None    Review of Systems: As mentioned in HPI   Physical Exam: BP 133/65   Pulse 77   Temp 97.7 F (36.5 C) (Oral)   Ht 5\' 2"  (1.575 m)   Wt 207 lb 3.2 oz (94 kg)   BMI 37.90 kg/m  General:   Alert and oriented. No distress noted. Pleasant and cooperative.  Head:  Normocephalic and atraumatic. Eyes:  Conjuctiva clear without scleral icterus. Abdomen:  +BS, soft, non-tender and non-distended. No rebound or guarding. No HSM or masses noted. Msk:  Symmetrical without gross deformities. Normal posture. Extremities:  Without edema. Neurologic:  Alert and  oriented x4;  grossly normal neurologically. Psych:  Alert and cooperative. Normal mood and affect.

## 2016-09-29 NOTE — Progress Notes (Signed)
cc'ed to pcp °

## 2016-10-06 ENCOUNTER — Encounter (HOSPITAL_BASED_OUTPATIENT_CLINIC_OR_DEPARTMENT_OTHER): Payer: Medicare Other | Admitting: Adult Health

## 2016-10-06 ENCOUNTER — Encounter (HOSPITAL_COMMUNITY): Payer: Medicare Other | Attending: Oncology

## 2016-10-06 ENCOUNTER — Encounter (HOSPITAL_COMMUNITY): Payer: Self-pay | Admitting: Adult Health

## 2016-10-06 VITALS — BP 149/83 | HR 70 | Temp 98.2°F | Resp 18 | Wt 206.7 lb

## 2016-10-06 DIAGNOSIS — D500 Iron deficiency anemia secondary to blood loss (chronic): Secondary | ICD-10-CM | POA: Diagnosis not present

## 2016-10-06 DIAGNOSIS — K922 Gastrointestinal hemorrhage, unspecified: Secondary | ICD-10-CM

## 2016-10-06 LAB — CBC WITH DIFFERENTIAL/PLATELET
BASOS ABS: 0.1 10*3/uL (ref 0.0–0.1)
Basophils Relative: 1 %
Eosinophils Absolute: 0.2 10*3/uL (ref 0.0–0.7)
Eosinophils Relative: 4 %
HEMATOCRIT: 37.3 % (ref 36.0–46.0)
Hemoglobin: 11.9 g/dL — ABNORMAL LOW (ref 12.0–15.0)
LYMPHS PCT: 27 %
Lymphs Abs: 1.9 10*3/uL (ref 0.7–4.0)
MCH: 26.4 pg (ref 26.0–34.0)
MCHC: 31.9 g/dL (ref 30.0–36.0)
MCV: 82.9 fL (ref 78.0–100.0)
Monocytes Absolute: 0.5 10*3/uL (ref 0.1–1.0)
Monocytes Relative: 7 %
NEUTROS ABS: 4.2 10*3/uL (ref 1.7–7.7)
Neutrophils Relative %: 61 %
Platelets: 249 10*3/uL (ref 150–400)
RBC: 4.5 MIL/uL (ref 3.87–5.11)
RDW: 16 % — ABNORMAL HIGH (ref 11.5–15.5)
WBC: 6.8 10*3/uL (ref 4.0–10.5)

## 2016-10-06 LAB — IRON AND TIBC
Iron: 58 ug/dL (ref 28–170)
Saturation Ratios: 19 % (ref 10.4–31.8)
TIBC: 309 ug/dL (ref 250–450)
UIBC: 251 ug/dL

## 2016-10-06 LAB — FERRITIN: Ferritin: 71 ng/mL (ref 11–307)

## 2016-10-06 NOTE — Patient Instructions (Addendum)
[place] at [facility] Discharge Instructions  RECOMMENDATIONS MADE BY THE CONSULTANT AND ANY TEST RESULTS WILL BE SENT TO YOUR REFERRING PHYSICIAN.  You saw Mike Craze, NP, today Lab work monthly x 3. Follow up in 3 months with labs. See Amy at checkout for appointments.   Thank you for choosing [place] at [facility] to provide your oncology and hematology care.  To afford each patient quality time with our provider, please arrive at least 15 minutes before your scheduled appointment time.    If you have a lab appointment with the [facility] please come in thru the  Main Entrance and check in at the main information desk  You need to re-schedule your appointment should you arrive 10 or more minutes late.  We strive to give you quality time with our providers, and arriving late affects you and other patients whose appointments are after yours.  Also, if you no show three or more times for appointments you may be dismissed from the clinic at the providers discretion.     Again, thank you for choosing [facility].  Our hope is that these requests will decrease the amount of time that you wait before being seen by our physicians.       _____________________________________________________________  Should you have questions after your visit to [facility], please contact our office at [phone] between the hours of 8:30 a.m. and 4:30 p.m.  Voicemails left after 4:30 p.m. will not be returned until the following business day.  For prescription refill requests, have your pharmacy contact our office.       Resources For Cancer Patients and their Caregivers ? American Cancer Society: Can assist with transportation, wigs, general needs, runs Look Good Feel Better.        5485361037 ? Cancer Care: Provides financial assistance, online support groups, medication/co-pay assistance.  1-800-813-HOPE  (804)793-6905) ? [place] Assists [place] cancer patients and their families through emotional , educational and financial support.  220-514-1486 ? [place] DSS Where to apply for food stamps, Medicaid and utility assistance. 279-318-5203 ? [facility]: Transportation to medical appointments. 601-649-8999 ? Social Security Administration: May apply for disability if have a Stage IV cancer. 818 613 9691 848-262-0151 ? [employer], Disability and Transit Services: Assists with nutrition, care and transit needs. [place] Support Programs: @10RELATIVEDAYS @ > Cancer Support Group  2nd Tuesday of the month 1pm-2pm, Journey Room  > Creative Journey  3rd Tuesday of the month 1130am-1pm, Journey Room  > Look Good Feel Better  1st Wednesday of the month 10am-12 noon, Journey Room (Call [place] to register [phone])

## 2016-10-07 ENCOUNTER — Other Ambulatory Visit (HOSPITAL_COMMUNITY): Payer: Self-pay | Admitting: Oncology

## 2016-10-08 NOTE — Progress Notes (Signed)
[address] [place], [place] [address]   CLINIC:  Medical Oncology/Hematology  PCP:  Curlene Labrum, MD [place] [id number] [phone]   REASON FOR VISIT:  Follow-up for Iron deficiency anemia   CURRENT THERAPY: Ferrous sulfate po BID     HISTORY OF PRESENT ILLNESS:  (From Kirby Crigler, PA-C's last note on 08/18/16)      INTERVAL HISTORY:  Ms. Mian 71 y.o. female returns for routine follow-up for iron deficiency anemia.  Overall, she tells me she has been feeling a little bit better since she received IV iron infusion.  States that she tolerated the IV iron well during the infusion. Does report that she had a rash and itching for about 3 weeks that she thinks may be from the infusion; rash/itching now resolved.   Appetite 100%; energy levels 75%. Her fatigue is improved.  Denies any blood in her stools, dark/tarry stools, hematuria, vaginal bleeding, nosebleeds, or gingival bleeding. Denies pica or pagophagia. She is not currently taking oral iron, as she was previously intolerant due to constipation.  Endorses chronic, periodic leg swelling particularly with hot weather.  Otherwise she is largely without complaints today.   IV iron administration record:  Oncology Flowsheet 08/26/2016  ferric carboxymaltose (INJECTAFER) IV 750 mg     REVIEW OF SYSTEMS:  Review of Systems  Constitutional: Positive for fatigue (improved). Negative for chills and fever.  HENT:  Negative.  Negative for lump/mass and nosebleeds.   Eyes: Negative.   Respiratory: Negative.  Negative for cough and shortness of breath.   Cardiovascular: Positive for leg swelling. Negative for chest pain.  Gastrointestinal: Negative.  Negative for abdominal pain, blood in stool, constipation, diarrhea, nausea and vomiting.  Endocrine: Negative.   Genitourinary: Negative.  Negative for dysuria and hematuria.   Musculoskeletal: Negative.  Negative for arthralgias.   Skin: Positive for itching and rash.       Rash/itching resolved  Neurological: Negative.  Negative for dizziness and headaches.  Hematological: Negative.  Negative for adenopathy. Does not bruise/bleed easily.  Psychiatric/Behavioral: Negative.  Negative for depression and sleep disturbance. The patient is not nervous/anxious.      PAST MEDICAL/SURGICAL HISTORY:  Past Medical History:  Diagnosis Date  . Anemia   . Anxiety   . Baker's cyst of knee   . Body mass index (BMI) of 36.0 to 36.9 in adult   . CHF (congestive heart failure) ([place])   . Chronic low back pain   . Diastolic dysfunction   . Essential hypertension   . GERD (gastroesophageal reflux disease)   . Heart murmur   . Hyperlipidemia   . Iron deficiency anemia 05/18/2016  . LBBB (left bundle branch block)   . Mitral regurgitation    Mild to moderate 2011  . Skin cancer    basal cell removed from face/melanoma removed from rt arm  . Type 2 diabetes mellitus ([place])    Past Surgical History:  Procedure Laterality Date  . ADENOIDECTOMY    . BACK SURGERY  05/2014  . BIOPSY  05/28/2016   Procedure: BIOPSY;  Surgeon: Danie Binder, MD;  [facility];  Service: Endoscopy;;  duodenal and gastric  . CHOLECYSTECTOMY  2009  . COLONOSCOPY N/A 05/28/2016   Dr. Oneida Alar: one 8 mm tubular adenomas, diverticulosis in recto-sigmoid, sigmoid, and descending. Colonoscopy in 5-10 years if benefits outweigh the risks  . ESOPHAGOGASTRODUODENOSCOPY N/A 05/28/2016   Dr. Oneida Alar: multiple benign-appearing gastric polyps,  gastritis  . GIVENS CAPSULE STUDY N/A 06/23/2016   Dr. Oneida Alar: mild ileitis  . MELANOMA SURGERY Right   . POLYPECTOMY  05/28/2016   Procedure: POLYPECTOMY;  Surgeon: Danie Binder, MD;  [facility];  Service: Endoscopy;;  ascending colon   . SKIN CANCER EXCISION     Basal cell removal from left side of face  . TONSILLECTOMY       SOCIAL HISTORY:  Social History   Social History  . Marital status:  Married    Spouse name: N/A  . Number of children: N/A  . Years of education: N/A   Occupational History  . Not on file.   Social History Main Topics  . Smoking status: Never Smoker  . Smokeless tobacco: Never Used  . Alcohol use No  . Drug use: No  . Sexual activity: Not on file     Comment: married   Other Topics Concern  . Not on file   Social History Narrative  . No narrative on file    FAMILY HISTORY:  Family History  Problem Relation Age of Onset  . Osteoporosis Mother   . Dementia Mother   . Diabetes Mother   . Breast cancer Sister   . Heart Problems Father   . Diabetes Father   . Breast cancer Sister   . COPD Sister   . Colon cancer Neg Hx     CURRENT MEDICATIONS:  Outpatient Encounter Prescriptions as of 10/06/2016  Medication Sig  . aspirin 81 MG tablet Take 1 tablet by mouth daily.  Marland Kitchen atorvastatin (LIPITOR) 20 MG tablet Take 1 tablet by mouth daily.  . Cholecalciferol (VITAMIN D3) 2000 units TABS Take 1 tablet by mouth daily.  Marland Kitchen diltiazem (CARDIZEM CD) 240 MG 24 hr capsule Take 1 capsule (240 mg total) by mouth daily.  . furosemide (LASIX) 40 MG tablet Take 40 mg by mouth 2 (two) times daily.   . insulin glargine (LANTUS) 100 UNIT/ML injection Inject 20 Units into the skin at bedtime.  . irbesartan (AVAPRO) 150 MG tablet Take 1 tablet by mouth daily.  Marland Kitchen loratadine (CLARITIN) 10 MG tablet Take 10 mg by mouth daily.  . metFORMIN (GLUCOPHAGE) 1000 MG tablet Take 1 tablet by mouth 2 (two) times daily.  Marland Kitchen omeprazole (PRILOSEC) 20 MG capsule Take 1 capsule by mouth daily.  Marland Kitchen PARoxetine (PAXIL) 10 MG tablet Take 0.5 tablets by mouth daily.  . sitaGLIPtin (JANUVIA) 100 MG tablet Take 1 tablet by mouth daily.   No facility-administered encounter medications on file as of 10/06/2016.     ALLERGIES:  Allergies  Allergen Reactions  . Cefdinir Other (See Comments)    Stomach cramps  . Nsaids Other (See Comments)    Stomach pain  . Sulfa Antibiotics Nausea  Only     PHYSICAL EXAM:  ECOG Performance status: 1 - Symptomatic; remains independent   Vitals:   10/06/16 1029  BP: (!) 149/83  Pulse: 70  Resp: 18  Temp: 98.2 F (36.8 C)   Filed Weights   10/06/16 1029  Weight: 206 lb 11.2 oz (93.8 kg)    Physical Exam  Constitutional: She is oriented to person, place, and time and well-developed, well-nourished, and in no distress.  HENT:  Head: Normocephalic.  Mouth/Throat: Oropharynx is clear and moist. No oropharyngeal exudate.  Eyes: Conjunctivae are normal. Pupils are equal, round, and reactive to light. No scleral icterus.  Neck: Normal range of motion. Neck supple.  Cardiovascular: Normal rate, regular rhythm and  normal heart sounds.   Pulmonary/Chest: Effort normal and breath sounds normal. No respiratory distress.  Abdominal: Soft. Bowel sounds are normal. There is no tenderness.  Musculoskeletal: Normal range of motion. She exhibits no edema.  Lymphadenopathy:    She has no cervical adenopathy.       Right: No supraclavicular adenopathy present.       Left: No supraclavicular adenopathy present.  Neurological: She is alert and oriented to person, place, and time. No cranial nerve deficit. Gait normal.  Skin: Skin is warm and dry. No rash noted.  Psychiatric: Mood, memory, affect and judgment normal.  Nursing note and vitals reviewed.    LABORATORY DATA:  I have reviewed the labs as listed.  CBC    Component Value Date/Time   WBC 6.8 10/06/2016 0959   RBC 4.50 10/06/2016 0959   HGB 11.9 (L) 10/06/2016 0959   HCT 37.3 10/06/2016 0959   PLT 249 10/06/2016 0959   MCV 82.9 10/06/2016 0959   MCH 26.4 10/06/2016 0959   MCHC 31.9 10/06/2016 0959   RDW 16.0 (H) 10/06/2016 0959   LYMPHSABS 1.9 10/06/2016 0959   MONOABS 0.5 10/06/2016 0959   EOSABS 0.2 10/06/2016 0959   BASOSABS 0.1 10/06/2016 0959   CMP Latest Ref Rng & Units 08/18/2016  Glucose 65 - 99 mg/dL 122(H)  BUN 6 - 20 mg/dL 12  Creatinine 0.44 - 1.00 mg/dL  0.53  Sodium 135 - 145 mmol/L 139  Potassium 3.5 - 5.1 mmol/L 3.7  Chloride 101 - 111 mmol/L 100(L)  CO2 22 - 32 mmol/L 30  Calcium 8.9 - 10.3 mg/dL 9.3    PENDING LABS:  Iron studies pending   DIAGNOSTIC IMAGING:  *The following radiologic images and reports have been reviewed independently and agree with below findings.  Colonoscopy/EGD: 05/28/16       Video capsule study: 06/23/16       PATHOLOGY:           ASSESSMENT & PLAN:   Iron deficiency anemia:  -Thought to be secondary to chronic GI blood loss. Last colonoscopy/EGD in 05/2016 did not show any evidence of bleeding. She completed video capsule study in 06/2016, which showed occasional erosions in the ileum.  -Hgb mildly low at 11.9 g/dL today, but improved from previous.   -Reports that she did have itching for 3 weeks after last IV iron infusion (injectafer).  No hypersensitivity reaction during infusion. Unclear if rash was secondary to iron infusion as there is no listed adverse drug reaction on UpToDate that mentions rash as a possible side effect (only mentioned dermatologic adverse effect is skin discoloration at injection site).   -She has intolerance to oral ferrous sulfate with constipation; she has admittedly not been as compliant taking the oral iron as a result.  If we are able to get her ferritin >100, then can consider starting prescription Ferrex Forte oral therapy which may be better tolerated than ferrous sulfate.  Iron studies pending for today and we will notify her of the results. -Will check labs monthly (CBC with diff and iron studies) and administer IV iron as appropriate.   -Return to cancer center center in 3 months with labs.       Dispo:  -Labs monthly x 3. (CBC with diff, ferritin, & iron/TIBC).  -Return to cancer center in 3 months for follow-up with labs (CBC with diff, CMET, ferritin, & iron/TIBC).    All questions were answered to patient's stated satisfaction. Encouraged  patient to call with any  new concerns or questions before her next visit to the cancer center and we can certain see her sooner, if needed.    Plan of care discussed with Dr. Talbert Cage, who agrees with the above aforementioned.    Orders placed this encounter:  Orders Placed This Encounter  Procedures  . CBC with Differential/Platelet  . Ferritin  . Iron and TIBC  . Comprehensive metabolic panel      Mike Craze, NP [place] [phone]

## 2016-10-11 DIAGNOSIS — Z0001 Encounter for general adult medical examination with abnormal findings: Secondary | ICD-10-CM | POA: Diagnosis not present

## 2016-10-11 DIAGNOSIS — I5032 Chronic diastolic (congestive) heart failure: Secondary | ICD-10-CM | POA: Diagnosis not present

## 2016-10-11 DIAGNOSIS — E1165 Type 2 diabetes mellitus with hyperglycemia: Secondary | ICD-10-CM | POA: Diagnosis not present

## 2016-10-11 DIAGNOSIS — I10 Essential (primary) hypertension: Secondary | ICD-10-CM | POA: Diagnosis not present

## 2016-10-11 DIAGNOSIS — D509 Iron deficiency anemia, unspecified: Secondary | ICD-10-CM | POA: Diagnosis not present

## 2016-10-11 DIAGNOSIS — E782 Mixed hyperlipidemia: Secondary | ICD-10-CM | POA: Diagnosis not present

## 2016-10-14 DIAGNOSIS — E782 Mixed hyperlipidemia: Secondary | ICD-10-CM | POA: Diagnosis not present

## 2016-10-14 DIAGNOSIS — I10 Essential (primary) hypertension: Secondary | ICD-10-CM | POA: Diagnosis not present

## 2016-10-14 DIAGNOSIS — I447 Left bundle-branch block, unspecified: Secondary | ICD-10-CM | POA: Diagnosis not present

## 2016-10-14 DIAGNOSIS — E1165 Type 2 diabetes mellitus with hyperglycemia: Secondary | ICD-10-CM | POA: Diagnosis not present

## 2016-10-15 ENCOUNTER — Encounter (HOSPITAL_COMMUNITY): Payer: Self-pay

## 2016-10-15 ENCOUNTER — Encounter (HOSPITAL_COMMUNITY): Payer: Medicare Other | Attending: Oncology

## 2016-10-15 VITALS — BP 162/57 | HR 62 | Temp 97.9°F | Resp 16

## 2016-10-15 DIAGNOSIS — K922 Gastrointestinal hemorrhage, unspecified: Secondary | ICD-10-CM

## 2016-10-15 DIAGNOSIS — D500 Iron deficiency anemia secondary to blood loss (chronic): Secondary | ICD-10-CM | POA: Diagnosis not present

## 2016-10-15 MED ORDER — SODIUM CHLORIDE 0.9 % IV SOLN
Freq: Once | INTRAVENOUS | Status: AC
  Administered 2016-10-15: 12:00:00 via INTRAVENOUS

## 2016-10-15 MED ORDER — FERUMOXYTOL INJECTION 510 MG/17 ML
510.0000 mg | Freq: Once | INTRAVENOUS | Status: AC
  Administered 2016-10-15: 510 mg via INTRAVENOUS
  Filled 2016-10-15: qty 17

## 2016-10-15 NOTE — Progress Notes (Signed)
Tolerated infusion w/o adverse reaction.  Alert, in no distress.  VSS.  Discharged ambulatory.  

## 2016-10-27 DIAGNOSIS — R928 Other abnormal and inconclusive findings on diagnostic imaging of breast: Secondary | ICD-10-CM | POA: Diagnosis not present

## 2016-10-27 DIAGNOSIS — Z803 Family history of malignant neoplasm of breast: Secondary | ICD-10-CM | POA: Diagnosis not present

## 2016-10-27 DIAGNOSIS — Z87898 Personal history of other specified conditions: Secondary | ICD-10-CM | POA: Diagnosis not present

## 2016-10-27 DIAGNOSIS — R921 Mammographic calcification found on diagnostic imaging of breast: Secondary | ICD-10-CM | POA: Diagnosis not present

## 2016-11-03 DIAGNOSIS — D3131 Benign neoplasm of right choroid: Secondary | ICD-10-CM | POA: Diagnosis not present

## 2016-11-03 DIAGNOSIS — H524 Presbyopia: Secondary | ICD-10-CM | POA: Diagnosis not present

## 2016-11-03 DIAGNOSIS — E119 Type 2 diabetes mellitus without complications: Secondary | ICD-10-CM | POA: Diagnosis not present

## 2016-11-05 ENCOUNTER — Encounter (HOSPITAL_COMMUNITY): Payer: Medicare Other

## 2016-11-05 DIAGNOSIS — D500 Iron deficiency anemia secondary to blood loss (chronic): Secondary | ICD-10-CM

## 2016-11-05 LAB — IRON AND TIBC
IRON: 86 ug/dL (ref 28–170)
Saturation Ratios: 30 % (ref 10.4–31.8)
TIBC: 287 ug/dL (ref 250–450)
UIBC: 201 ug/dL

## 2016-11-05 LAB — CBC WITH DIFFERENTIAL/PLATELET
BASOS ABS: 0 10*3/uL (ref 0.0–0.1)
BASOS PCT: 0 %
EOS ABS: 0.2 10*3/uL (ref 0.0–0.7)
EOS PCT: 3 %
HCT: 38.8 % (ref 36.0–46.0)
Hemoglobin: 12.6 g/dL (ref 12.0–15.0)
LYMPHS ABS: 2.1 10*3/uL (ref 0.7–4.0)
LYMPHS PCT: 29 %
MCH: 27.4 pg (ref 26.0–34.0)
MCHC: 32.5 g/dL (ref 30.0–36.0)
MCV: 84.3 fL (ref 78.0–100.0)
MONO ABS: 0.4 10*3/uL (ref 0.1–1.0)
Monocytes Relative: 6 %
Neutro Abs: 4.3 10*3/uL (ref 1.7–7.7)
Neutrophils Relative %: 62 %
PLATELETS: 227 10*3/uL (ref 150–400)
RBC: 4.6 MIL/uL (ref 3.87–5.11)
RDW: 15.1 % (ref 11.5–15.5)
WBC: 7 10*3/uL (ref 4.0–10.5)

## 2016-11-05 LAB — FERRITIN: Ferritin: 252 ng/mL (ref 11–307)

## 2016-11-26 NOTE — Progress Notes (Signed)
REVIEWED. RECEIVING IVFe. JUL 2018: NL FERRITIN/CBC

## 2016-11-29 ENCOUNTER — Encounter (INDEPENDENT_AMBULATORY_CARE_PROVIDER_SITE_OTHER): Payer: Medicare Other | Admitting: Ophthalmology

## 2016-11-29 DIAGNOSIS — H353122 Nonexudative age-related macular degeneration, left eye, intermediate dry stage: Secondary | ICD-10-CM | POA: Diagnosis not present

## 2016-11-29 DIAGNOSIS — I10 Essential (primary) hypertension: Secondary | ICD-10-CM

## 2016-11-29 DIAGNOSIS — H35033 Hypertensive retinopathy, bilateral: Secondary | ICD-10-CM | POA: Diagnosis not present

## 2016-11-29 DIAGNOSIS — D3132 Benign neoplasm of left choroid: Secondary | ICD-10-CM | POA: Diagnosis not present

## 2016-11-29 DIAGNOSIS — H43813 Vitreous degeneration, bilateral: Secondary | ICD-10-CM

## 2016-11-29 DIAGNOSIS — D3131 Benign neoplasm of right choroid: Secondary | ICD-10-CM | POA: Diagnosis not present

## 2016-11-29 DIAGNOSIS — H2513 Age-related nuclear cataract, bilateral: Secondary | ICD-10-CM | POA: Diagnosis not present

## 2016-12-06 ENCOUNTER — Encounter (HOSPITAL_COMMUNITY): Payer: Medicare Other | Attending: Oncology

## 2016-12-06 DIAGNOSIS — D500 Iron deficiency anemia secondary to blood loss (chronic): Secondary | ICD-10-CM | POA: Insufficient documentation

## 2016-12-06 LAB — CBC WITH DIFFERENTIAL/PLATELET
BASOS ABS: 0 10*3/uL (ref 0.0–0.1)
Basophils Relative: 1 %
EOS ABS: 0.2 10*3/uL (ref 0.0–0.7)
EOS PCT: 2 %
HCT: 41.1 % (ref 36.0–46.0)
Hemoglobin: 13.4 g/dL (ref 12.0–15.0)
Lymphocytes Relative: 30 %
Lymphs Abs: 2.2 10*3/uL (ref 0.7–4.0)
MCH: 28.2 pg (ref 26.0–34.0)
MCHC: 32.6 g/dL (ref 30.0–36.0)
MCV: 86.5 fL (ref 78.0–100.0)
MONO ABS: 0.5 10*3/uL (ref 0.1–1.0)
Monocytes Relative: 7 %
Neutro Abs: 4.5 10*3/uL (ref 1.7–7.7)
Neutrophils Relative %: 60 %
PLATELETS: 266 10*3/uL (ref 150–400)
RBC: 4.75 MIL/uL (ref 3.87–5.11)
RDW: 14.4 % (ref 11.5–15.5)
WBC: 7.5 10*3/uL (ref 4.0–10.5)

## 2016-12-06 LAB — IRON AND TIBC
Iron: 75 ug/dL (ref 28–170)
SATURATION RATIOS: 24 % (ref 10.4–31.8)
TIBC: 307 ug/dL (ref 250–450)
UIBC: 232 ug/dL

## 2016-12-06 LAB — FERRITIN: FERRITIN: 157 ng/mL (ref 11–307)

## 2017-01-05 ENCOUNTER — Ambulatory Visit (INDEPENDENT_AMBULATORY_CARE_PROVIDER_SITE_OTHER): Payer: Medicare Other | Admitting: Cardiology

## 2017-01-05 ENCOUNTER — Encounter: Payer: Self-pay | Admitting: Cardiology

## 2017-01-05 VITALS — BP 122/58 | HR 69 | Ht 63.0 in | Wt 209.2 lb

## 2017-01-05 DIAGNOSIS — I447 Left bundle-branch block, unspecified: Secondary | ICD-10-CM

## 2017-01-05 DIAGNOSIS — I471 Supraventricular tachycardia, unspecified: Secondary | ICD-10-CM

## 2017-01-05 DIAGNOSIS — I358 Other nonrheumatic aortic valve disorders: Secondary | ICD-10-CM | POA: Diagnosis not present

## 2017-01-05 NOTE — Progress Notes (Signed)
Cardiology Office Note  Date: 01/05/2017   ID: CANDAS DEEMER, DOB 1946/01/03, MRN [id number]  PCP: Curlene Labrum, MD  Primary Cardiologist: Rozann Lesches, MD   Chief Complaint  Patient presents with  . Follow-up palpitations    History of Present Illness: Kristina Berry is a 71 y.o. female last seen in January. She presents for a routine follow-up visit. Since last encounter she reports only occasional palpitations, no prolonged symptoms, lightheadedness, or syncope. She has had no exertional chest pain and reports NYHA class II dyspnea.  I personally reviewed her ECG today which shows sinus rhythm with left bundle branch block.  We went over her medications. She continues on Cardizem CD at 240 mg daily with good control of her palpitations overall.  Past Medical History:  Diagnosis Date  . Anemia   . Anxiety   . Baker's cyst of knee   . Chronic low back pain   . Diastolic dysfunction   . Essential hypertension   . GERD (gastroesophageal reflux disease)   . Hyperlipidemia   . Iron deficiency anemia 05/18/2016  . LBBB (left bundle branch block)   . Mitral regurgitation    Mild to moderate 2011  . Skin cancer    basal cell removed from face/melanoma removed from rt arm  . Type 2 diabetes mellitus ([place])     Past Surgical History:  Procedure Laterality Date  . ADENOIDECTOMY    . BACK SURGERY  05/2014  . BIOPSY  05/28/2016   Procedure: BIOPSY;  Surgeon: Danie Binder, MD;  [facility];  Service: Endoscopy;;  duodenal and gastric  . CHOLECYSTECTOMY  2009  . COLONOSCOPY N/A 05/28/2016   Dr. Oneida Alar: one 8 mm tubular adenomas, diverticulosis in recto-sigmoid, sigmoid, and descending. Colonoscopy in 5-10 years if benefits outweigh the risks  . ESOPHAGOGASTRODUODENOSCOPY N/A 05/28/2016   Dr. Oneida Alar: multiple benign-appearing gastric polyps, gastritis  . GIVENS CAPSULE STUDY N/A 06/23/2016   Dr. Oneida Alar: mild ileitis  . MELANOMA SURGERY Right   .  POLYPECTOMY  05/28/2016   Procedure: POLYPECTOMY;  Surgeon: Danie Binder, MD;  [facility];  Service: Endoscopy;;  ascending colon   . SKIN CANCER EXCISION     Basal cell removal from left side of face  . TONSILLECTOMY      Current Outpatient Prescriptions  Medication Sig Dispense Refill  . aspirin 81 MG tablet Take 1 tablet by mouth daily.    Marland Kitchen atorvastatin (LIPITOR) 20 MG tablet Take 1 tablet by mouth daily.    . Cholecalciferol (VITAMIN D3) 2000 units TABS Take 1 tablet by mouth daily.    Marland Kitchen diltiazem (CARDIZEM CD) 240 MG 24 hr capsule Take 1 capsule (240 mg total) by mouth daily. 90 capsule 3  . furosemide (LASIX) 40 MG tablet Take 40 mg by mouth 2 (two) times daily.     . insulin glargine (LANTUS) 100 UNIT/ML injection Inject 20 Units into the skin at bedtime.    . irbesartan (AVAPRO) 150 MG tablet Take 1 tablet by mouth daily.    Marland Kitchen loratadine (CLARITIN) 10 MG tablet Take 10 mg by mouth daily.    . metFORMIN (GLUCOPHAGE) 1000 MG tablet Take 1 tablet by mouth 2 (two) times daily.    Marland Kitchen omeprazole (PRILOSEC) 20 MG capsule Take 1 capsule by mouth daily.    Marland Kitchen PARoxetine (PAXIL) 10 MG tablet Take 0.5 tablets by mouth daily.    . sitaGLIPtin (JANUVIA) 100 MG tablet Take 1 tablet  by mouth daily.     No current facility-administered medications for this visit.    Allergies:  Cefdinir; Nsaids; and Sulfa antibiotics   Social History: The patient  reports that she has never smoked. She has never used smokeless tobacco. She reports that she does not drink alcohol or use drugs.   ROS:  Please see the history of present illness. Otherwise, complete review of systems is positive for none.  All other systems are reviewed and negative.   Physical Exam: VS:  BP (!) 122/58   Pulse 69   Ht 5\' 3"  (1.6 m)   Wt 209 lb 3.2 oz (94.9 kg)   SpO2 96%   BMI 37.06 kg/m , BMI Body mass index is 37.06 kg/m.  Wt Readings from Last 3 Encounters:  01/05/17 209 lb 3.2 oz (94.9 kg)  10/06/16 206  lb 11.2 oz (93.8 kg)  09/28/16 207 lb 3.2 oz (94 kg)    General: Obese woman, appears comfortable at rest. HEENT: Conjunctiva and lids normal, oropharynx clear. Neck: Supple, no elevated JVP or carotid bruits, no thyromegaly. Lungs: Clear to auscultation, nonlabored breathing at rest. Cardiac: Regular rate and rhythm, no S3, 2/6 systolic murmur, no pericardial rub. Abdomen: Soft, nontender, bowel sounds present, no guarding or rebound. Extremities: No pitting edema, distal pulses 2+.  ECG: I personally reviewed the tracing from 10/02/2015 which showed sinus rhythm with left bundle branch block.  Recent Labwork: 08/18/2016: BUN 12; Creatinine, Ser 0.53; Potassium 3.7; Sodium 139 12/06/2016: Hemoglobin 13.4; Platelets 266   Other Studies Reviewed Today:  Echocardiogram 11/05/2015: Study Conclusions  - Left ventricle: The cavity size was normal. Wall thickness was normal. Systolic function was normal. The estimated ejection fraction was in the range of 50% to 55%. Features are consistent with a pseudonormal left ventricular filling pattern, with concomitant abnormal relaxation and increased filling pressure (grade 2 diastolic dysfunction). - Ventricular septum: Septal motion showed abnormal function and dyssynergy. - Aortic valve: Mildly calcified annulus. Trileaflet. - Mitral valve: There was mild regurgitation. - Left atrium: The atrium was mildly dilated. - Right atrium: Central venous pressure (est): 3 mm Hg. - Atrial septum: No defect or patent foramen ovale was identified. - Tricuspid valve: There was trivial regurgitation. - Pulmonary arteries: PA peak pressure: 22 mm Hg (S). - Pericardium, extracardiac: A trivial pericardial effusion was identified posterior to the heart.  Impressions:  - Normal LV wall thickness with LVEF 50-55%. Septal dyssynergy consistent with left bundle branch block. Grade 2 diastolic dysfunction with increased LV filling  pressure. Mild left atrial enlargement. Mild mitral regurgitation. Mildly calcified aortic annulus. Trivial tricuspid regurgitation with normal PASP 22 mmHg. Trivial posterior pericardial effusion.  Lexiscan Myoview 05/01/2014 [facility] Dt): No ischemic defects reported, LVEF 48%.  Assessment and Plan:  1. History of palpitations with PACs and brief bursts of PSVT based on previous cardiac monitoring. She is doing well overall in terms of symptom control on Cardizem CD, and we will continue with observation.  2. Heart murmur with aortic valve sclerosis and mild mitral regurgitation by echocardiogram.  3. Chronic left bundle branch block.  Current medicines were reviewed with the patient today.   Orders Placed This Encounter  Procedures  . EKG 12-Lead    Disposition: Follow-up in one year.  Signed, Satira Sark, MD, [facility] 01/05/2017 1:50 PM    [place] [place] at [place], [place], [place] [address] Phone: [phone]; Fax: [phone]

## 2017-01-05 NOTE — Patient Instructions (Signed)

## 2017-01-06 ENCOUNTER — Encounter (HOSPITAL_BASED_OUTPATIENT_CLINIC_OR_DEPARTMENT_OTHER): Payer: Medicare Other | Admitting: Oncology

## 2017-01-06 ENCOUNTER — Encounter (HOSPITAL_COMMUNITY): Payer: Self-pay | Admitting: Oncology

## 2017-01-06 ENCOUNTER — Encounter (HOSPITAL_COMMUNITY): Payer: Medicare Other | Attending: Oncology

## 2017-01-06 VITALS — BP 141/47 | HR 70 | Resp 18 | Ht 62.0 in | Wt 208.0 lb

## 2017-01-06 DIAGNOSIS — K922 Gastrointestinal hemorrhage, unspecified: Secondary | ICD-10-CM | POA: Diagnosis not present

## 2017-01-06 DIAGNOSIS — D500 Iron deficiency anemia secondary to blood loss (chronic): Secondary | ICD-10-CM | POA: Insufficient documentation

## 2017-01-06 DIAGNOSIS — D508 Other iron deficiency anemias: Secondary | ICD-10-CM

## 2017-01-06 LAB — CBC WITH DIFFERENTIAL/PLATELET
Basophils Absolute: 0 10*3/uL (ref 0.0–0.1)
Basophils Relative: 0 %
Eosinophils Absolute: 0.2 10*3/uL (ref 0.0–0.7)
Eosinophils Relative: 2 %
HEMATOCRIT: 37.9 % (ref 36.0–46.0)
HEMOGLOBIN: 12.3 g/dL (ref 12.0–15.0)
LYMPHS ABS: 2.5 10*3/uL (ref 0.7–4.0)
LYMPHS PCT: 32 %
MCH: 28.5 pg (ref 26.0–34.0)
MCHC: 32.5 g/dL (ref 30.0–36.0)
MCV: 87.7 fL (ref 78.0–100.0)
MONOS PCT: 6 %
Monocytes Absolute: 0.5 10*3/uL (ref 0.1–1.0)
NEUTROS ABS: 4.7 10*3/uL (ref 1.7–7.7)
NEUTROS PCT: 60 %
Platelets: 242 10*3/uL (ref 150–400)
RBC: 4.32 MIL/uL (ref 3.87–5.11)
RDW: 13.1 % (ref 11.5–15.5)
WBC: 7.9 10*3/uL (ref 4.0–10.5)

## 2017-01-06 LAB — COMPREHENSIVE METABOLIC PANEL
ALBUMIN: 3.9 g/dL (ref 3.5–5.0)
ALT: 16 U/L (ref 14–54)
AST: 20 U/L (ref 15–41)
Alkaline Phosphatase: 81 U/L (ref 38–126)
Anion gap: 9 (ref 5–15)
BUN: 13 mg/dL (ref 6–20)
CHLORIDE: 98 mmol/L — AB (ref 101–111)
CO2: 30 mmol/L (ref 22–32)
CREATININE: 0.55 mg/dL (ref 0.44–1.00)
Calcium: 9.3 mg/dL (ref 8.9–10.3)
GFR calc Af Amer: >60 mL/min (ref >60)
Glucose, Bld: 128 mg/dL — ABNORMAL HIGH (ref 65–99)
POTASSIUM: 4 mmol/L (ref 3.5–5.1)
SODIUM: 137 mmol/L (ref 135–145)
Total Bilirubin: 0.8 mg/dL (ref 0.3–1.2)
Total Protein: 6.9 g/dL (ref 6.5–8.1)

## 2017-01-06 LAB — IRON AND TIBC
IRON: 78 ug/dL (ref 28–170)
Saturation Ratios: 29 % (ref 10.4–31.8)
TIBC: 272 ug/dL (ref 250–450)
UIBC: 194 ug/dL

## 2017-01-06 LAB — FERRITIN: FERRITIN: 120 ng/mL (ref 11–307)

## 2017-01-06 NOTE — Progress Notes (Signed)
Kristina Berry, [place] [id number]   CLINIC:  Medical Oncology/Hematology  PCP:  Kristina Labrum, MD [place] [address] [phone]   REASON FOR VISIT:  Follow-up for Iron deficiency anemia   CURRENT THERAPY: Ferrous sulfate po BID     HISTORY OF PRESENT ILLNESS:  (From Kristina Crigler, PA-C's last note on 08/18/16)      INTERVAL HISTORY:  Kristina Berry 71 y.o. female returns for routine follow-up for iron deficiency anemia. She states that she has been doing well since her last visit. She has occasional constipation. She also has occasional leg swelling. She has not been taking any oral iron supplements. She states that she feels overall well. She denies any chest pain, shortness breath, abdominal pain, focal weakness. She denies any changes in appetite or energy level.   IV iron administration record:  Oncology Flowsheet 08/26/2016 10/15/2016  ferric carboxymaltose (INJECTAFER) IV 750 mg   ferumoxytol (FERAHEME) IV  510 mg     REVIEW OF SYSTEMS:  Review of Systems  Constitutional: Negative for chills, fatigue and fever.  HENT:  Negative.  Negative for lump/mass and nosebleeds.   Eyes: Negative.   Respiratory: Negative.  Negative for cough and shortness of breath.   Cardiovascular: Positive for leg swelling. Negative for chest pain.  Gastrointestinal: Negative.  Negative for abdominal pain, blood in stool, constipation, diarrhea, nausea and vomiting.  Endocrine: Negative.   Genitourinary: Negative.  Negative for dysuria and hematuria.   Musculoskeletal: Negative.  Negative for arthralgias.  Skin: Negative for itching and rash.  Neurological: Negative.  Negative for dizziness and headaches.  Hematological: Negative.  Negative for adenopathy. Does not bruise/bleed easily.  Psychiatric/Behavioral: Negative.  Negative for depression and sleep disturbance. The patient is not nervous/anxious.      PAST MEDICAL/SURGICAL HISTORY:    Past Medical History:  Diagnosis Date  . Anemia   . Anxiety   . Baker's cyst of knee   . Chronic low back pain   . Diastolic dysfunction   . Essential hypertension   . GERD (gastroesophageal reflux disease)   . Hyperlipidemia   . Iron deficiency anemia 05/18/2016  . LBBB (left bundle branch block)   . Mitral regurgitation    Mild to moderate 2011  . Skin cancer    basal cell removed from face/melanoma removed from rt arm  . Type 2 diabetes mellitus ([place])    Past Surgical History:  Procedure Laterality Date  . ADENOIDECTOMY    . BACK SURGERY  05/2014  . BIOPSY  05/28/2016   Procedure: BIOPSY;  Surgeon: Danie Binder, MD;  [facility];  Service: Endoscopy;;  duodenal and gastric  . CHOLECYSTECTOMY  2009  . COLONOSCOPY N/A 05/28/2016   Dr. Oneida Alar: one 8 mm tubular adenomas, diverticulosis in recto-sigmoid, sigmoid, and descending. Colonoscopy in 5-10 years if benefits outweigh the risks  . ESOPHAGOGASTRODUODENOSCOPY N/A 05/28/2016   Dr. Oneida Alar: multiple benign-appearing gastric polyps, gastritis  . GIVENS CAPSULE STUDY N/A 06/23/2016   Dr. Oneida Alar: mild ileitis  . MELANOMA SURGERY Right   . POLYPECTOMY  05/28/2016   Procedure: POLYPECTOMY;  Surgeon: Danie Binder, MD;  [facility];  Service: Endoscopy;;  ascending colon   . SKIN CANCER EXCISION     Basal cell removal from left side of face  . TONSILLECTOMY       SOCIAL HISTORY:  Social History   Social History  . Marital status: Married  Spouse name: N/A  . Number of children: N/A  . Years of education: N/A   Occupational History  . Not on file.   Social History Main Topics  . Smoking status: Never Smoker  . Smokeless tobacco: Never Used  . Alcohol use No  . Drug use: No  . Sexual activity: Not on file     Comment: married   Other Topics Concern  . Not on file   Social History Narrative  . No narrative on file    FAMILY HISTORY:  Family History  Problem Relation Age of Onset  .  Osteoporosis Mother   . Dementia Mother   . Diabetes Mother   . Breast cancer Sister   . Heart Problems Father   . Diabetes Father   . Breast cancer Sister   . COPD Sister   . Colon cancer Neg Hx     CURRENT MEDICATIONS:  Outpatient Encounter Prescriptions as of 01/06/2017  Medication Sig  . aspirin 81 MG tablet Take 1 tablet by mouth daily.  Marland Kitchen atorvastatin (LIPITOR) 20 MG tablet Take 1 tablet by mouth daily.  . Cholecalciferol (VITAMIN D3) 2000 units TABS Take 1 tablet by mouth daily.  Marland Kitchen diltiazem (CARDIZEM CD) 240 MG 24 hr capsule Take 1 capsule (240 mg total) by mouth daily.  . furosemide (LASIX) 40 MG tablet Take 40 mg by mouth 2 (two) times daily.   . insulin glargine (LANTUS) 100 UNIT/ML injection Inject 20 Units into the skin at bedtime.  . irbesartan (AVAPRO) 150 MG tablet Take 1 tablet by mouth daily.  Marland Kitchen loratadine (CLARITIN) 10 MG tablet Take 10 mg by mouth daily.  . metFORMIN (GLUCOPHAGE) 1000 MG tablet Take 1 tablet by mouth 2 (two) times daily.  Marland Kitchen omeprazole (PRILOSEC) 20 MG capsule Take 1 capsule by mouth daily.  Marland Kitchen PARoxetine (PAXIL) 10 MG tablet Take 0.5 tablets by mouth daily.  . sitaGLIPtin (JANUVIA) 100 MG tablet Take 1 tablet by mouth daily.   No facility-administered encounter medications on file as of 01/06/2017.     ALLERGIES:  Allergies  Allergen Reactions  . Cefdinir Other (See Comments)    Stomach cramps  . Nsaids Other (See Comments)    Stomach pain  . Sulfa Antibiotics Nausea Only     PHYSICAL EXAM:  ECOG Performance status: 1 - Symptomatic; remains independent   Vitals:   01/06/17 1422  BP: (!) 141/47  Pulse: 70  Resp: 18  SpO2: 98%   Filed Weights   01/06/17 1422  Weight: 208 lb (94.3 kg)    Physical Exam  Constitutional: She is oriented to person, place, and time and well-developed, well-nourished, and in no distress.  HENT:  Head: Normocephalic.  Mouth/Throat: Oropharynx is clear and moist. No oropharyngeal exudate.  Eyes:  Pupils are equal, round, and reactive to light. Conjunctivae are normal. No scleral icterus.  Neck: Normal range of motion. Neck supple.  Cardiovascular: Normal rate, regular rhythm and normal heart sounds.   Pulmonary/Chest: Effort normal and breath sounds normal. No respiratory distress.  Abdominal: Soft. Bowel sounds are normal. There is no tenderness.  Musculoskeletal: Normal range of motion. She exhibits no edema.  Lymphadenopathy:    She has no cervical adenopathy.       Right: No supraclavicular adenopathy present.       Left: No supraclavicular adenopathy present.  Neurological: She is alert and oriented to person, place, and time. No cranial nerve deficit. Gait normal.  Skin: Skin is warm and dry. No rash  noted.  Psychiatric: Mood, memory, affect and judgment normal.  Nursing note and vitals reviewed.    LABORATORY DATA:  I have reviewed the labs as listed.  CBC    Component Value Date/Time   WBC 7.9 01/06/2017 1350   RBC 4.32 01/06/2017 1350   HGB 12.3 01/06/2017 1350   HCT 37.9 01/06/2017 1350   PLT 242 01/06/2017 1350   MCV 87.7 01/06/2017 1350   MCH 28.5 01/06/2017 1350   MCHC 32.5 01/06/2017 1350   RDW 13.1 01/06/2017 1350   LYMPHSABS 2.5 01/06/2017 1350   MONOABS 0.5 01/06/2017 1350   EOSABS 0.2 01/06/2017 1350   BASOSABS 0.0 01/06/2017 1350   CMP Latest Ref Rng & Units 08/18/2016  Glucose 65 - 99 mg/dL 122(H)  BUN 6 - 20 mg/dL 12  Creatinine 0.44 - 1.00 mg/dL 0.53  Sodium 135 - 145 mmol/L 139  Potassium 3.5 - 5.1 mmol/L 3.7  Chloride 101 - 111 mmol/L 100(L)  CO2 22 - 32 mmol/L 30  Calcium 8.9 - 10.3 mg/dL 9.3    PENDING LABS:  Iron studies pending   DIAGNOSTIC IMAGING:  *The following radiologic images and reports have been reviewed independently and agree with below findings.  Colonoscopy/EGD: 05/28/16       Video capsule study: 06/23/16       PATHOLOGY:           ASSESSMENT & PLAN:   Iron deficiency anemia:  -Thought to be  secondary to chronic GI blood loss. Last colonoscopy/EGD in 05/2016 did not show any evidence of bleeding. She completed video capsule study in 06/2016, which showed occasional erosions in the ileum.  -Hemoglobin is 12.3 g/dL today. I do not have her iron studies back at this time for a month ago her iron studies were within normal limits with ferritin greater than 100. We'll follow-up on her iron levels today. -Return to clinic in 4 months for follow-up with labs.  All questions were answered to patient's stated satisfaction. Encouraged patient to call with any new concerns or questions before her next visit to the cancer center and we can certain see her sooner, if needed.    Orders placed this encounter:  Orders Placed This Encounter  Procedures  . CBC with Differential  . Comprehensive metabolic panel  . Iron and TIBC  . Ferritin    Twana First MD

## 2017-01-28 ENCOUNTER — Other Ambulatory Visit: Payer: Self-pay | Admitting: Cardiology

## 2017-01-31 DIAGNOSIS — Z23 Encounter for immunization: Secondary | ICD-10-CM | POA: Diagnosis not present

## 2017-03-14 DIAGNOSIS — J019 Acute sinusitis, unspecified: Secondary | ICD-10-CM | POA: Diagnosis not present

## 2017-03-14 DIAGNOSIS — J209 Acute bronchitis, unspecified: Secondary | ICD-10-CM | POA: Diagnosis not present

## 2017-04-11 DIAGNOSIS — D500 Iron deficiency anemia secondary to blood loss (chronic): Secondary | ICD-10-CM | POA: Diagnosis not present

## 2017-04-11 DIAGNOSIS — D649 Anemia, unspecified: Secondary | ICD-10-CM | POA: Diagnosis not present

## 2017-04-11 DIAGNOSIS — I10 Essential (primary) hypertension: Secondary | ICD-10-CM | POA: Diagnosis not present

## 2017-04-11 DIAGNOSIS — E782 Mixed hyperlipidemia: Secondary | ICD-10-CM | POA: Diagnosis not present

## 2017-04-11 DIAGNOSIS — E1165 Type 2 diabetes mellitus with hyperglycemia: Secondary | ICD-10-CM | POA: Diagnosis not present

## 2017-04-13 DIAGNOSIS — I10 Essential (primary) hypertension: Secondary | ICD-10-CM | POA: Diagnosis not present

## 2017-04-13 DIAGNOSIS — E782 Mixed hyperlipidemia: Secondary | ICD-10-CM | POA: Diagnosis not present

## 2017-04-13 DIAGNOSIS — I340 Nonrheumatic mitral (valve) insufficiency: Secondary | ICD-10-CM | POA: Diagnosis not present

## 2017-04-13 DIAGNOSIS — E1165 Type 2 diabetes mellitus with hyperglycemia: Secondary | ICD-10-CM | POA: Diagnosis not present

## 2017-04-13 DIAGNOSIS — D500 Iron deficiency anemia secondary to blood loss (chronic): Secondary | ICD-10-CM | POA: Diagnosis not present

## 2017-04-22 DIAGNOSIS — H40013 Open angle with borderline findings, low risk, bilateral: Secondary | ICD-10-CM | POA: Diagnosis not present

## 2017-04-22 DIAGNOSIS — H2589 Other age-related cataract: Secondary | ICD-10-CM | POA: Diagnosis not present

## 2017-05-03 ENCOUNTER — Other Ambulatory Visit (HOSPITAL_COMMUNITY): Payer: Self-pay | Admitting: *Deleted

## 2017-05-03 DIAGNOSIS — D508 Other iron deficiency anemias: Secondary | ICD-10-CM

## 2017-05-04 ENCOUNTER — Inpatient Hospital Stay (HOSPITAL_COMMUNITY): Payer: Medicare Other | Attending: Oncology

## 2017-05-04 DIAGNOSIS — D508 Other iron deficiency anemias: Secondary | ICD-10-CM

## 2017-05-04 DIAGNOSIS — D509 Iron deficiency anemia, unspecified: Secondary | ICD-10-CM | POA: Insufficient documentation

## 2017-05-04 LAB — IRON AND TIBC
IRON: 78 ug/dL (ref 28–170)
SATURATION RATIOS: 25 % (ref 10.4–31.8)
TIBC: 309 ug/dL (ref 250–450)
UIBC: 231 ug/dL

## 2017-05-04 LAB — CBC WITH DIFFERENTIAL/PLATELET
BASOS PCT: 1 %
Basophils Absolute: 0 10*3/uL (ref 0.0–0.1)
Eosinophils Absolute: 0.2 10*3/uL (ref 0.0–0.7)
Eosinophils Relative: 2 %
HEMATOCRIT: 39.9 % (ref 36.0–46.0)
HEMOGLOBIN: 13 g/dL (ref 12.0–15.0)
LYMPHS ABS: 2.1 10*3/uL (ref 0.7–4.0)
LYMPHS PCT: 27 %
MCH: 29.1 pg (ref 26.0–34.0)
MCHC: 32.6 g/dL (ref 30.0–36.0)
MCV: 89.5 fL (ref 78.0–100.0)
MONOS PCT: 8 %
Monocytes Absolute: 0.6 10*3/uL (ref 0.1–1.0)
NEUTROS ABS: 4.8 10*3/uL (ref 1.7–7.7)
NEUTROS PCT: 62 %
Platelets: 283 10*3/uL (ref 150–400)
RBC: 4.46 MIL/uL (ref 3.87–5.11)
RDW: 12.8 % (ref 11.5–15.5)
WBC: 7.8 10*3/uL (ref 4.0–10.5)

## 2017-05-04 LAB — COMPREHENSIVE METABOLIC PANEL
ALBUMIN: 3.9 g/dL (ref 3.5–5.0)
ALK PHOS: 89 U/L (ref 38–126)
ALT: 14 U/L (ref 14–54)
AST: 20 U/L (ref 15–41)
Anion gap: 12 (ref 5–15)
BILIRUBIN TOTAL: 0.9 mg/dL (ref 0.3–1.2)
BUN: 16 mg/dL (ref 6–20)
CALCIUM: 9.3 mg/dL (ref 8.9–10.3)
CO2: 29 mmol/L (ref 22–32)
Chloride: 98 mmol/L — ABNORMAL LOW (ref 101–111)
Creatinine, Ser: 0.54 mg/dL (ref 0.44–1.00)
Glucose, Bld: 154 mg/dL — ABNORMAL HIGH (ref 65–99)
POTASSIUM: 4.4 mmol/L (ref 3.5–5.1)
Sodium: 139 mmol/L (ref 135–145)
TOTAL PROTEIN: 7 g/dL (ref 6.5–8.1)

## 2017-05-04 LAB — FERRITIN: Ferritin: 138 ng/mL (ref 11–307)

## 2017-05-11 ENCOUNTER — Encounter (HOSPITAL_COMMUNITY): Payer: Self-pay | Admitting: Internal Medicine

## 2017-05-11 ENCOUNTER — Inpatient Hospital Stay (HOSPITAL_COMMUNITY): Payer: Medicare Other | Admitting: Internal Medicine

## 2017-05-11 VITALS — BP 137/59 | HR 74 | Temp 98.1°F | Resp 18 | Wt 201.2 lb

## 2017-05-11 DIAGNOSIS — D509 Iron deficiency anemia, unspecified: Secondary | ICD-10-CM | POA: Diagnosis not present

## 2017-05-11 DIAGNOSIS — D508 Other iron deficiency anemias: Secondary | ICD-10-CM

## 2017-05-11 NOTE — Progress Notes (Signed)
CHIEF COMPLAINT: Kristina Berry returns today for follow-up for iron deficiency.    HPI: she feels well without any evidence of bleeding no excess fatigue no change in her bowel habits or weight is stable   LABS: Her hemoglobin from 05/04/2017 is 13 platelets are 283 white cell count is 7.8 her vitals are stable.  Her CMP from 123 is normal the CBC reported from 05/04/2017 as well Iron panel from 123 showed a serum iron of 78 TIBC 309 ferritin 138 saturation was 25%.  Her last colonoscopy and EGD were from February 2018. She had benign gastric polyps, diverticulosis of the rectosigmoid colon and internal hemorrhoids.  She also had a capsule camera study in March 2018, showed occasional erosions within the ileum in March 2018.     She had a tubular adenoma without high grade dysplasia within the colon, no H pylori. Minimally active gastritis was noted.    Impression and plan: Iron deficiency anemia, completely resolved.  Patient was asked to stop any iron supplements.  We will continue to follow with return in 6 months to confirm stability off of iron.  She is up-to-date with her colonoscopy and EGD she will contact us for any new problems in the interval.  She follows with a primary care physician.

## 2017-05-11 NOTE — Patient Instructions (Signed)
[place] at [facility]  Discharge Instructions:  You were seen by Dr. [place].   _______________________________________________________________  Thank you for choosing [place] at [facility] to provide your oncology and hematology care.  To afford each patient quality time with our providers, please arrive at least 15 minutes before your scheduled appointment.  You need to re-schedule your appointment if you arrive 10 or more minutes late.  We strive to give you quality time with our providers, and arriving late affects you and other patients whose appointments are after yours.  Also, if you no show three or more times for appointments you may be dismissed from the clinic.  Again, thank you for choosing [place] [facility] hope is that these requests will allow you access to exceptional care and in a timely manner. _______________________________________________________________  If you have questions after your visit, please contact our office at [phone] between the hours of 8:30 a.m. and 5:00 p.m. Voicemails left after 4:30 p.m. will not be returned until the following business day. _______________________________________________________________  For prescription refill requests, have your pharmacy contact our office. _______________________________________________________________  Recommendations made by the consultant and any test results will be sent to your referring physician. _______________________________________________________________

## 2017-07-18 DIAGNOSIS — E870 Hyperosmolality and hypernatremia: Secondary | ICD-10-CM | POA: Diagnosis not present

## 2017-07-18 DIAGNOSIS — R5383 Other fatigue: Secondary | ICD-10-CM | POA: Diagnosis not present

## 2017-07-18 DIAGNOSIS — I5032 Chronic diastolic (congestive) heart failure: Secondary | ICD-10-CM | POA: Diagnosis not present

## 2017-07-18 DIAGNOSIS — I10 Essential (primary) hypertension: Secondary | ICD-10-CM | POA: Diagnosis not present

## 2017-07-18 DIAGNOSIS — E1165 Type 2 diabetes mellitus with hyperglycemia: Secondary | ICD-10-CM | POA: Diagnosis not present

## 2017-07-21 DIAGNOSIS — E875 Hyperkalemia: Secondary | ICD-10-CM | POA: Diagnosis not present

## 2017-07-21 DIAGNOSIS — E1165 Type 2 diabetes mellitus with hyperglycemia: Secondary | ICD-10-CM | POA: Diagnosis not present

## 2017-07-21 DIAGNOSIS — D500 Iron deficiency anemia secondary to blood loss (chronic): Secondary | ICD-10-CM | POA: Diagnosis not present

## 2017-07-21 DIAGNOSIS — I447 Left bundle-branch block, unspecified: Secondary | ICD-10-CM | POA: Diagnosis not present

## 2017-07-21 DIAGNOSIS — I10 Essential (primary) hypertension: Secondary | ICD-10-CM | POA: Diagnosis not present

## 2017-08-04 ENCOUNTER — Encounter: Payer: Self-pay | Admitting: Gastroenterology

## 2017-08-30 DIAGNOSIS — J209 Acute bronchitis, unspecified: Secondary | ICD-10-CM | POA: Diagnosis not present

## 2017-08-30 DIAGNOSIS — D500 Iron deficiency anemia secondary to blood loss (chronic): Secondary | ICD-10-CM | POA: Diagnosis not present

## 2017-08-30 DIAGNOSIS — I447 Left bundle-branch block, unspecified: Secondary | ICD-10-CM | POA: Diagnosis not present

## 2017-08-30 DIAGNOSIS — E1165 Type 2 diabetes mellitus with hyperglycemia: Secondary | ICD-10-CM | POA: Diagnosis not present

## 2017-08-30 DIAGNOSIS — I471 Supraventricular tachycardia: Secondary | ICD-10-CM | POA: Diagnosis not present

## 2017-09-05 DIAGNOSIS — R05 Cough: Secondary | ICD-10-CM | POA: Diagnosis not present

## 2017-09-05 DIAGNOSIS — J209 Acute bronchitis, unspecified: Secondary | ICD-10-CM | POA: Diagnosis not present

## 2017-09-05 DIAGNOSIS — J0101 Acute recurrent maxillary sinusitis: Secondary | ICD-10-CM | POA: Diagnosis not present

## 2017-09-16 DIAGNOSIS — J189 Pneumonia, unspecified organism: Secondary | ICD-10-CM | POA: Diagnosis not present

## 2017-11-01 ENCOUNTER — Inpatient Hospital Stay (HOSPITAL_COMMUNITY): Payer: Medicare Other | Attending: Hematology

## 2017-11-01 DIAGNOSIS — D509 Iron deficiency anemia, unspecified: Secondary | ICD-10-CM | POA: Insufficient documentation

## 2017-11-01 DIAGNOSIS — D508 Other iron deficiency anemias: Secondary | ICD-10-CM

## 2017-11-01 LAB — CBC WITH DIFFERENTIAL/PLATELET
Basophils Absolute: 0 10*3/uL (ref 0.0–0.1)
Basophils Relative: 0 %
EOS ABS: 0.2 10*3/uL (ref 0.0–0.7)
EOS PCT: 2 %
HCT: 36.4 % (ref 36.0–46.0)
HEMOGLOBIN: 11.7 g/dL — AB (ref 12.0–15.0)
LYMPHS ABS: 2.2 10*3/uL (ref 0.7–4.0)
Lymphocytes Relative: 30 %
MCH: 28.8 pg (ref 26.0–34.0)
MCHC: 32.1 g/dL (ref 30.0–36.0)
MCV: 89.7 fL (ref 78.0–100.0)
MONOS PCT: 6 %
Monocytes Absolute: 0.5 10*3/uL (ref 0.1–1.0)
NEUTROS PCT: 62 %
Neutro Abs: 4.5 10*3/uL (ref 1.7–7.7)
Platelets: 228 10*3/uL (ref 150–400)
RBC: 4.06 MIL/uL (ref 3.87–5.11)
RDW: 14 % (ref 11.5–15.5)
WBC: 7.4 10*3/uL (ref 4.0–10.5)

## 2017-11-01 LAB — IRON AND TIBC
IRON: 86 ug/dL (ref 28–170)
SATURATION RATIOS: 31 % (ref 10.4–31.8)
TIBC: 276 ug/dL (ref 250–450)
UIBC: 190 ug/dL

## 2017-11-01 LAB — COMPREHENSIVE METABOLIC PANEL
ALT: 13 U/L (ref 0–44)
ANION GAP: 7 (ref 5–15)
AST: 14 U/L — ABNORMAL LOW (ref 15–41)
Albumin: 3.6 g/dL (ref 3.5–5.0)
Alkaline Phosphatase: 70 U/L (ref 38–126)
BILIRUBIN TOTAL: 0.8 mg/dL (ref 0.3–1.2)
BUN: 13 mg/dL (ref 8–23)
CALCIUM: 9.2 mg/dL (ref 8.9–10.3)
CO2: 33 mmol/L — AB (ref 22–32)
Chloride: 102 mmol/L (ref 98–111)
Creatinine, Ser: 0.48 mg/dL (ref 0.44–1.00)
GFR calc non Af Amer: >60 mL/min (ref >60)
Glucose, Bld: 122 mg/dL — ABNORMAL HIGH (ref 70–99)
Potassium: 4.5 mmol/L (ref 3.5–5.1)
SODIUM: 142 mmol/L (ref 135–145)
TOTAL PROTEIN: 6.4 g/dL — AB (ref 6.5–8.1)

## 2017-11-01 LAB — FERRITIN: FERRITIN: 103 ng/mL (ref 11–307)

## 2017-11-08 ENCOUNTER — Inpatient Hospital Stay (HOSPITAL_COMMUNITY): Payer: Medicare Other | Attending: Hematology | Admitting: Hematology

## 2017-11-08 ENCOUNTER — Other Ambulatory Visit: Payer: Self-pay

## 2017-11-08 ENCOUNTER — Encounter (HOSPITAL_COMMUNITY): Payer: Self-pay | Admitting: Hematology

## 2017-11-08 VITALS — BP 152/52 | HR 74 | Temp 98.2°F | Resp 18 | Wt 195.0 lb

## 2017-11-08 DIAGNOSIS — D509 Iron deficiency anemia, unspecified: Secondary | ICD-10-CM | POA: Diagnosis not present

## 2017-11-08 DIAGNOSIS — D508 Other iron deficiency anemias: Secondary | ICD-10-CM

## 2017-11-08 NOTE — Progress Notes (Signed)
[place], [place] [address]   CLINIC:  Medical Oncology/Hematology  PCP:  Curlene Labrum, MD [place] [address] [phone]   REASON FOR VISIT:  Follow-up for iron deficiency anemia  CURRENT THERAPY: Iron IV PRN   INTERVAL HISTORY:  Kristina Berry 72 y.o. female returns for routine follow-up iron deficiency anemia. Patient is feeling good with no complaints. She has stopped taking her oral iron supplements. She did have constipation last time she took iron supplements and had to take a stool softener with the medication. She will start back on the oral iron today. Last time she had IV iron she had a rash and itching for 3 weeks before it resolved. Patient denies any blood in her stools, dark stools, hematuria, or nosebleeds. Denies any fevers or hospitalizations. Denies any easy bruising. Her appetite remains good at 100%. Here energy levels are staying at 75%.     REVIEW OF SYSTEMS:  Review of Systems  Constitutional: Positive for fatigue.  HENT:  Negative.   Eyes: Negative.   Respiratory: Negative.   Cardiovascular: Negative.   Gastrointestinal: Negative.   Endocrine: Negative.   Genitourinary: Negative.    Musculoskeletal: Negative.   Skin: Negative.   Neurological: Negative.   Hematological: Negative.   Psychiatric/Behavioral: Negative.      PAST MEDICAL/SURGICAL HISTORY:  Past Medical History:  Diagnosis Date  . Anemia   . Anxiety   . Baker's cyst of knee   . Chronic low back pain   . Diastolic dysfunction   . Essential hypertension   . GERD (gastroesophageal reflux disease)   . Hyperlipidemia   . Iron deficiency anemia 05/18/2016  . LBBB (left bundle branch block)   . Mitral regurgitation    Mild to moderate 2011  . Skin cancer    basal cell removed from face/melanoma removed from rt arm  . Type 2 diabetes mellitus ([place])    Past Surgical History:  Procedure Laterality Date  . ADENOIDECTOMY    .  BACK SURGERY  05/2014  . BIOPSY  05/28/2016   Procedure: BIOPSY;  Surgeon: Danie Binder, MD;  [facility];  Service: Endoscopy;;  duodenal and gastric  . CHOLECYSTECTOMY  2009  . COLONOSCOPY N/A 05/28/2016   Dr. Oneida Alar: one 8 mm tubular adenomas, diverticulosis in recto-sigmoid, sigmoid, and descending. Colonoscopy in 5-10 years if benefits outweigh the risks  . ESOPHAGOGASTRODUODENOSCOPY N/A 05/28/2016   Dr. Oneida Alar: multiple benign-appearing gastric polyps, gastritis  . GIVENS CAPSULE STUDY N/A 06/23/2016   Dr. Oneida Alar: mild ileitis  . MELANOMA SURGERY Right   . POLYPECTOMY  05/28/2016   Procedure: POLYPECTOMY;  Surgeon: Danie Binder, MD;  Location: [facility];  Service: Endoscopy;;  ascending colon   . SKIN CANCER EXCISION     Basal cell removal from left side of face  . TONSILLECTOMY       SOCIAL HISTORY:  Social History   Socioeconomic History  . Marital status: Married    Spouse name: Not on file  . Number of children: Not on file  . Years of education: Not on file  . Highest education level: Not on file  Occupational History  . Not on file  Social Needs  . Financial resource strain: Not on file  . Food insecurity:    Worry: Not on file    Inability: Not on file  . Transportation needs:    Medical: Not on file  Non-medical: Not on file  Tobacco Use  . Smoking status: Never Smoker  . Smokeless tobacco: Never Used  Substance and Sexual Activity  . Alcohol use: No    Alcohol/week: 0.0 oz  . Drug use: No  . Sexual activity: Not on file    Comment: married  Lifestyle  . Physical activity:    Days per week: Not on file    Minutes per session: Not on file  . Stress: Not on file  Relationships  . Social connections:    Talks on phone: Not on file    Gets together: Not on file    Attends religious service: Not on file    Active member of club or organization: Not on file    Attends meetings of clubs or organizations: Not on file    Relationship  status: Not on file  . Intimate partner violence:    Fear of current or ex partner: Not on file    Emotionally abused: Not on file    Physically abused: Not on file    Forced sexual activity: Not on file  Other Topics Concern  . Not on file  Social History Narrative  . Not on file    FAMILY HISTORY:  Family History  Problem Relation Age of Onset  . Osteoporosis Mother   . Dementia Mother   . Diabetes Mother   . Breast cancer Sister   . Heart Problems Father   . Diabetes Father   . Breast cancer Sister   . COPD Sister   . Colon cancer Neg Hx     CURRENT MEDICATIONS:  Outpatient Encounter Medications as of 11/08/2017  Medication Sig  . aspirin 81 MG tablet Take 1 tablet by mouth daily.  Marland Kitchen atorvastatin (LIPITOR) 20 MG tablet Take 1 tablet by mouth daily.  . Cholecalciferol (VITAMIN D3) 2000 units TABS Take 1 tablet by mouth daily.  Marland Kitchen diltiazem (CARDIZEM CD) 240 MG 24 hr capsule TAKE 1 CAPSULE BY MOUTH  DAILY  . furosemide (LASIX) 40 MG tablet Take 40 mg by mouth 2 (two) times daily.   . insulin glargine (LANTUS) 100 UNIT/ML injection Inject 20 Units into the skin at bedtime.  . irbesartan (AVAPRO) 150 MG tablet Take 1 tablet by mouth daily.  Marland Kitchen ketoconazole (NIZORAL) 2 % cream   . loratadine (CLARITIN) 10 MG tablet Take 10 mg by mouth daily.  . metFORMIN (GLUCOPHAGE) 1000 MG tablet Take 1 tablet by mouth 2 (two) times daily.  Marland Kitchen omeprazole (PRILOSEC) 20 MG capsule Take 1 capsule by mouth daily.  Marland Kitchen PARoxetine (PAXIL) 10 MG tablet Take 0.5 tablets by mouth daily.  Marland Kitchen PROAIR HFA 108 (90 Base) MCG/ACT inhaler INHALE 1 TO 2 PUFFS BY MOUTH EVERY 4 TO 6 HOURS AS NEEDED FOR SHORTNESS OF BREATH OR WHEEZING  . sitaGLIPtin (JANUVIA) 100 MG tablet Take 1 tablet by mouth daily.   No facility-administered encounter medications on file as of 11/08/2017.     ALLERGIES:  Allergies  Allergen Reactions  . Cefdinir Other (See Comments)    Stomach cramps  . Nsaids Other (See Comments)     Stomach pain  . Sulfa Antibiotics Nausea Only     PHYSICAL EXAM:  ECOG Performance status: 0  Vitals:   11/08/17 1459  BP: (!) 152/52  Pulse: 74  Resp: 18  Temp: 98.2 F (36.8 C)  SpO2: 100%   Filed Weights   11/08/17 1459  Weight: 195 lb (88.5 kg)    Physical Exam  Constitutional:  She is oriented to person, place, and time.  Cardiovascular: Normal rate, regular rhythm and normal heart sounds.  Pulmonary/Chest: Effort normal and breath sounds normal.  Neurological: She is alert and oriented to person, place, and time.  Skin: Skin is warm and dry.     LABORATORY DATA:  I have reviewed the labs as listed.  CBC    Component Value Date/Time   WBC 7.4 11/01/2017 1036   RBC 4.06 11/01/2017 1036   HGB 11.7 (L) 11/01/2017 1036   HCT 36.4 11/01/2017 1036   PLT 228 11/01/2017 1036   MCV 89.7 11/01/2017 1036   MCH 28.8 11/01/2017 1036   MCHC 32.1 11/01/2017 1036   RDW 14.0 11/01/2017 1036   LYMPHSABS 2.2 11/01/2017 1036   MONOABS 0.5 11/01/2017 1036   EOSABS 0.2 11/01/2017 1036   BASOSABS 0.0 11/01/2017 1036   CMP Latest Ref Rng & Units 11/01/2017 05/04/2017 01/06/2017  Glucose 70 - 99 mg/dL 122(H) 154(H) 128(H)  BUN 8 - 23 mg/dL 13 16 13   Creatinine 0.44 - 1.00 mg/dL 0.48 0.54 0.55  Sodium 135 - 145 mmol/L 142 139 137  Potassium 3.5 - 5.1 mmol/L 4.5 4.4 4.0  Chloride 98 - 111 mmol/L 102 98(L) 98(L)  CO2 22 - 32 mmol/L 33(H) 29 30  Calcium 8.9 - 10.3 mg/dL 9.2 9.3 9.3  Total Protein 6.5 - 8.1 g/dL 6.4(L) 7.0 6.9  Total Bilirubin 0.3 - 1.2 mg/dL 0.8 0.9 0.8  Alkaline Phos 38 - 126 U/L 70 89 81  AST 15 - 41 U/L 14(L) 20 20  ALT 0 - 44 U/L 13 14 16           ASSESSMENT & PLAN:   Iron deficiency anemia 1.  Iron deficiency anemia: - History of iron deficiency state from GI blood loss from ileal erosions.  EGD and colonoscopy in February 2018 showed benign gastric polyps, moderate gastritis, external and internal hemorrhoids. -Denies any bleeding per rectum or  melena.  Off of oral iron therapy.  Last Feraheme infusion was on 10/15/2016. -Hemoglobin today is 11.7 with ferritin of 103.  Ferritin is slowly trending down.  I have recommended her to start back on iron tablets daily.  If she cannot tolerate iron therapy or her ferritin continues to go down due to decreased absorption, we will consider parenteral iron therapy again. -She will be given 68-month follow-up visit with repeat ferritin and iron panel.      Orders placed this encounter:  Orders Placed This Encounter  Procedures  . CBC with Differential/Platelet  . Comprehensive metabolic panel  . Ferritin  . Iron and TIBC      Derek Jack, MD [place] [phone]

## 2017-11-08 NOTE — Assessment & Plan Note (Signed)
1.  Iron deficiency anemia: - History of iron deficiency state from GI blood loss from ileal erosions.  EGD and colonoscopy in February 2018 showed benign gastric polyps, moderate gastritis, external and internal hemorrhoids. -Denies any bleeding per rectum or melena.  Off of oral iron therapy.  Last Feraheme infusion was on 10/15/2016. -Hemoglobin today is 11.7 with ferritin of 103.  Ferritin is slowly trending down.  I have recommended her to start back on iron tablets daily.  If she cannot tolerate iron therapy or her ferritin continues to go down due to decreased absorption, we will consider parenteral iron therapy again. -She will be given 25-month follow-up visit with repeat ferritin and iron panel.

## 2017-11-16 ENCOUNTER — Ambulatory Visit: Payer: Medicare Other | Admitting: Gastroenterology

## 2017-11-16 ENCOUNTER — Encounter: Payer: Self-pay | Admitting: Gastroenterology

## 2017-11-16 VITALS — BP 147/64 | HR 71 | Temp 97.1°F | Ht 62.0 in | Wt 197.2 lb

## 2017-11-16 DIAGNOSIS — D508 Other iron deficiency anemias: Secondary | ICD-10-CM

## 2017-11-16 NOTE — Progress Notes (Signed)
Primary Care Physician:  Curlene Labrum, MD  Primary GI: Dr. Oneida Alar   Chief Complaint  Patient presents with  . Anemia    iron still low and was advised to restart iron. has been on iron x 1 week    HPI:   Kristina Berry is a 72 y.o. female presenting today with a history of IDA, s/p EGD, colonoscopy, and capsule study. Followed by Hematology. Sine last seen a year ago, infusion received July 2018. Last Hgb 11.7, ferritin 103, slowly trending down. She saw Hematology recently and will start oral iron again.   81 mg aspirin. Prilosec daily. Back on oral iron once daily. No constipation thus far. No constipation or diarrhea. She receives Prilosec from PCP. Wonders if she can return to see [place] prn. No overt GI bleeding.   Past Medical History:  Diagnosis Date  . Anemia   . Anxiety   . Baker's cyst of knee   . Chronic low back pain   . Diastolic dysfunction   . Essential hypertension   . GERD (gastroesophageal reflux disease)   . Hyperlipidemia   . Iron deficiency anemia 05/18/2016  . LBBB (left bundle branch block)   . Mitral regurgitation    Mild to moderate 2011  . Skin cancer    basal cell removed from face/melanoma removed from rt arm  . Type 2 diabetes mellitus ([place])     Past Surgical History:  Procedure Laterality Date  . ADENOIDECTOMY    . BACK SURGERY  05/2014  . BIOPSY  05/28/2016   Procedure: BIOPSY;  Surgeon: Danie Binder, MD;  [facility];  Service: Endoscopy;;  duodenal and gastric  . CHOLECYSTECTOMY  2009  . COLONOSCOPY N/A 05/28/2016   Dr. Oneida Alar: one 8 mm tubular adenomas, diverticulosis in recto-sigmoid, sigmoid, and descending. Colonoscopy in 5-10 years if benefits outweigh the risks  . ESOPHAGOGASTRODUODENOSCOPY N/A 05/28/2016   Dr. Oneida Alar: multiple benign-appearing gastric polyps, gastritis  . GIVENS CAPSULE STUDY N/A 06/23/2016   Dr. Oneida Alar: mild ileitis  . MELANOMA SURGERY Right   . POLYPECTOMY  05/28/2016   Procedure: POLYPECTOMY;   Surgeon: Danie Binder, MD;  [facility];  Service: Endoscopy;;  ascending colon   . SKIN CANCER EXCISION     Basal cell removal from left side of face  . TONSILLECTOMY      Current Outpatient Medications  Medication Sig Dispense Refill  . aspirin 81 MG tablet Take 1 tablet by mouth daily.    Marland Kitchen atorvastatin (LIPITOR) 20 MG tablet Take 1 tablet by mouth daily.    . Cholecalciferol (VITAMIN D3) 2000 units TABS Take 1 tablet by mouth daily.    Marland Kitchen diltiazem (CARDIZEM CD) 240 MG 24 hr capsule TAKE 1 CAPSULE BY MOUTH  DAILY 90 capsule 3  . ferrous sulfate 325 (65 FE) MG tablet Take 325 mg by mouth daily with breakfast.    . furosemide (LASIX) 40 MG tablet Take 40 mg by mouth 2 (two) times daily.     . insulin glargine (LANTUS) 100 UNIT/ML injection Inject 20 Units into the skin at bedtime.    . irbesartan (AVAPRO) 150 MG tablet Take 1 tablet by mouth daily.    Marland Kitchen ketoconazole (NIZORAL) 2 % cream     . loratadine (CLARITIN) 10 MG tablet Take 10 mg by mouth daily.    . metFORMIN (GLUCOPHAGE) 1000 MG tablet Take 1 tablet by mouth 2 (two) times daily.    Marland Kitchen omeprazole (  PRILOSEC) 20 MG capsule Take 1 capsule by mouth daily.    Marland Kitchen PARoxetine (PAXIL) 10 MG tablet Take 0.5 tablets by mouth daily.    Marland Kitchen PROAIR HFA 108 (90 Base) MCG/ACT inhaler INHALE 1 TO 2 PUFFS BY MOUTH EVERY 4 TO 6 HOURS AS NEEDED FOR SHORTNESS OF BREATH OR WHEEZING  2  . sitaGLIPtin (JANUVIA) 100 MG tablet Take 1 tablet by mouth daily.     No current facility-administered medications for this visit.     Allergies as of 11/16/2017 - Review Complete 11/16/2017  Allergen Reaction Noted  . Cefdinir Other (See Comments) 10/24/2015  . Nsaids Other (See Comments) 10/24/2015  . Sulfa antibiotics Nausea Only 10/24/2015    Family History  Problem Relation Age of Onset  . Osteoporosis Mother   . Dementia Mother   . Diabetes Mother   . Breast cancer Sister   . Heart Problems Father   . Diabetes Father   . Breast cancer  Sister   . COPD Sister   . Colon cancer Neg Hx     Social History   Socioeconomic History  . Marital status: Married    Spouse name: Not on file  . Number of children: Not on file  . Years of education: Not on file  . Highest education level: Not on file  Occupational History  . Not on file  Social Needs  . Financial resource strain: Not on file  . Food insecurity:    Worry: Not on file    Inability: Not on file  . Transportation needs:    Medical: Not on file    Non-medical: Not on file  Tobacco Use  . Smoking status: Never Smoker  . Smokeless tobacco: Never Used  Substance and Sexual Activity  . Alcohol use: No    Alcohol/week: 0.0 oz  . Drug use: No  . Sexual activity: Not on file    Comment: married  Lifestyle  . Physical activity:    Days per week: Not on file    Minutes per session: Not on file  . Stress: Not on file  Relationships  . Social connections:    Talks on phone: Not on file    Gets together: Not on file    Attends religious service: Not on file    Active member of club or organization: Not on file    Attends meetings of clubs or organizations: Not on file    Relationship status: Not on file  Other Topics Concern  . Not on file  Social History Narrative  . Not on file    Review of Systems: Gen: Denies fever, chills, anorexia. Denies fatigue, weakness, weight loss.  CV: Denies chest pain, palpitations, syncope, peripheral edema, and claudication. Resp: Denies dyspnea at rest, cough, wheezing, coughing up blood, and pleurisy. GI: see HPI  Derm: Denies rash, itching, dry skin Psych: Denies depression, anxiety, memory loss, confusion. No homicidal or suicidal ideation.  Heme: Denies bruising, bleeding, and enlarged lymph nodes.  Physical Exam: BP (!) 147/64   Pulse 71   Temp (!) 97.1 F (36.2 C) (Oral)   Ht 5\' 2"  (1.575 m)   Wt 197 lb 3.2 oz (89.4 kg)   BMI 36.07 kg/m  General:   Alert and oriented. No distress noted. Pleasant and  cooperative.  Head:  Normocephalic and atraumatic. Eyes:  Conjuctiva clear without scleral icterus. Mouth:  Oral mucosa pink and moist.  Abdomen:  +BS, soft, non-tender and non-distended. No rebound or guarding. No HSM  or masses noted. Msk:  Symmetrical without gross deformities. Normal posture. Extremities:  Without edema. Neurologic:  Alert and  oriented x4 Psych:  Alert and cooperative. Normal mood and affect.

## 2017-11-16 NOTE — Assessment & Plan Note (Signed)
72 year old pleasant female with history of IDA, s/p thorough evaluation from a GI perspective last year. Remains on Prilosec daily, no NSAIDs other than 81 mg aspirin. Hgb with mild drift recently and ferritin 103, slowly trending down. She has not been on oral iron but has resumed this as of last week. No concerning lower or upper GI symptoms. Continue follow-up closely with Hematology, PCP, and return to see [place] prn, as she has requested streamlining care. Discussed signs/symptoms that would be of concern. Next colonoscopy to be considered in 2023 if health permits.

## 2017-11-16 NOTE — Patient Instructions (Signed)
Continue close follow-up with Hematology. Let us know if you need anything! We can see you back as needed or if you are having any GI issues.  Your next colonoscopy can be considered in 2023.   It was a pleasure to see you today. I strive to create trusting relationships with patients to provide genuine, compassionate, and quality care. I value your feedback. If you receive a survey regarding your visit,  I greatly appreciate you taking time to fill this out.   Annitta Needs, PhD, ANP-BC [facility]

## 2017-11-16 NOTE — Progress Notes (Signed)
cc'ed to pcp °

## 2017-11-24 DIAGNOSIS — Z1231 Encounter for screening mammogram for malignant neoplasm of breast: Secondary | ICD-10-CM | POA: Diagnosis not present

## 2017-11-30 ENCOUNTER — Encounter (INDEPENDENT_AMBULATORY_CARE_PROVIDER_SITE_OTHER): Payer: Medicare Other | Admitting: Ophthalmology

## 2017-11-30 DIAGNOSIS — D3132 Benign neoplasm of left choroid: Secondary | ICD-10-CM

## 2017-11-30 DIAGNOSIS — H353132 Nonexudative age-related macular degeneration, bilateral, intermediate dry stage: Secondary | ICD-10-CM

## 2017-11-30 DIAGNOSIS — D3131 Benign neoplasm of right choroid: Secondary | ICD-10-CM

## 2017-11-30 DIAGNOSIS — H43813 Vitreous degeneration, bilateral: Secondary | ICD-10-CM

## 2017-11-30 DIAGNOSIS — I10 Essential (primary) hypertension: Secondary | ICD-10-CM | POA: Diagnosis not present

## 2017-11-30 DIAGNOSIS — H2513 Age-related nuclear cataract, bilateral: Secondary | ICD-10-CM

## 2017-11-30 DIAGNOSIS — H35033 Hypertensive retinopathy, bilateral: Secondary | ICD-10-CM

## 2018-01-06 NOTE — Progress Notes (Signed)
Cardiology Office Note  Date: 01/09/2018   ID: [name], DOB 10/27/1945, MRN [id number]  PCP: Curlene Labrum, MD  Primary Cardiologist: Rozann Lesches, MD   Chief Complaint  Patient presents with  . PSVT    History of Present Illness: Kristina Berry is a 72 y.o. female last seen in September 2018.  She is here for a routine follow-up visit.  She reports no palpitations or exertional chest pain, no syncope.  Occasional ankle swelling, has not been as active over the hot summer months.  She otherwise reports compliance with her medications including Cardizem CD at 240 mg daily.  I personally reviewed her ECG today which shows sinus rhythm with old left bundle branch block.  Her last echocardiogram was in 2017.  She has a long-standing heart murmur, history of mild to moderate mitral regurgitation in the past, although mild by last assessment.  Also aortic annular calcification.  LVEF was 50 to 55% with grade 2 diastolic dysfunction.  Past Medical History:  Diagnosis Date  . Anemia   . Anxiety   . Baker's cyst of knee   . Chronic low back pain   . Diastolic dysfunction   . Essential hypertension   . GERD (gastroesophageal reflux disease)   . Hyperlipidemia   . Iron deficiency anemia 05/18/2016  . LBBB (left bundle branch block)   . Mitral regurgitation    Mild to moderate 2011  . Skin cancer    basal cell removed from face/melanoma removed from rt arm  . Type 2 diabetes mellitus ([place])     Past Surgical History:  Procedure Laterality Date  . ADENOIDECTOMY    . BACK SURGERY  05/2014  . BIOPSY  05/28/2016   Procedure: BIOPSY;  Surgeon: Danie Binder, MD;  [facility];  Service: Endoscopy;;  duodenal and gastric  . CHOLECYSTECTOMY  2009  . COLONOSCOPY N/A 05/28/2016   Dr. Oneida Alar: one 8 mm tubular adenomas, diverticulosis in recto-sigmoid, sigmoid, and descending. Colonoscopy in 5-10 years if benefits outweigh the risks  .  ESOPHAGOGASTRODUODENOSCOPY N/A 05/28/2016   Dr. Oneida Alar: multiple benign-appearing gastric polyps, gastritis  . GIVENS CAPSULE STUDY N/A 06/23/2016   Dr. Oneida Alar: mild ileitis  . MELANOMA SURGERY Right   . POLYPECTOMY  05/28/2016   Procedure: POLYPECTOMY;  Surgeon: Danie Binder, MD;  [facility];  Service: Endoscopy;;  ascending colon   . SKIN CANCER EXCISION     Basal cell removal from left side of face  . TONSILLECTOMY      Current Outpatient Medications  Medication Sig Dispense Refill  . aspirin 81 MG tablet Take 1 tablet by mouth daily.    Marland Kitchen atorvastatin (LIPITOR) 20 MG tablet Take 1 tablet by mouth daily.    . Cholecalciferol (VITAMIN D3) 2000 units TABS Take 1 tablet by mouth daily.    Marland Kitchen diltiazem (CARDIZEM CD) 240 MG 24 hr capsule TAKE 1 CAPSULE BY MOUTH  DAILY 90 capsule 3  . ferrous sulfate 325 (65 FE) MG tablet Take 325 mg by mouth daily with breakfast.    . furosemide (LASIX) 40 MG tablet Take 40 mg by mouth 2 (two) times daily.     . insulin glargine (LANTUS) 100 UNIT/ML injection Inject 20 Units into the skin at bedtime.    . irbesartan (AVAPRO) 150 MG tablet Take 1 tablet by mouth daily.    Marland Kitchen ketoconazole (NIZORAL) 2 % cream as needed.     . loratadine (CLARITIN) 10  MG tablet Take 10 mg by mouth daily.    . metFORMIN (GLUCOPHAGE) 1000 MG tablet Take 1 tablet by mouth 2 (two) times daily.    Marland Kitchen omeprazole (PRILOSEC) 20 MG capsule Take 1 capsule by mouth daily.    Marland Kitchen PARoxetine (PAXIL) 10 MG tablet Take 0.5 tablets by mouth daily.    Marland Kitchen PROAIR HFA 108 (90 Base) MCG/ACT inhaler INHALE 1 TO 2 PUFFS BY MOUTH EVERY 4 TO 6 HOURS AS NEEDED FOR SHORTNESS OF BREATH OR WHEEZING  2  . sitaGLIPtin (JANUVIA) 100 MG tablet Take 1 tablet by mouth daily.     No current facility-administered medications for this visit.    Allergies:  Cefdinir; Nsaids; and Sulfa antibiotics   Social History: The patient  reports that she has never smoked. She has never used smokeless tobacco. She  reports that she does not drink alcohol or use drugs.   ROS:  Please see the history of present illness. Otherwise, complete review of systems is positive for none.  All other systems are reviewed and negative.   Physical Exam: VS:  BP (!) 130/58   Pulse 68   Ht 5\' 2"  (1.575 m)   Wt 204 lb 12.8 oz (92.9 kg)   SpO2 98%   BMI 37.46 kg/m , BMI Body mass index is 37.46 kg/m.  Wt Readings from Last 3 Encounters:  01/09/18 204 lb 12.8 oz (92.9 kg)  11/16/17 197 lb 3.2 oz (89.4 kg)  11/08/17 195 lb (88.5 kg)    General: Patient appears comfortable at rest. HEENT: Conjunctiva and lids normal, oropharynx clear. Neck: Supple, no elevated JVP or carotid bruits, no thyromegaly. Lungs: Clear to auscultation, nonlabored breathing at rest. Cardiac: Regular rate and rhythm, no S3, 2/6 systolic murmur. Abdomen: Soft, nontender, bowel sounds present. Extremities: No pitting edema, distal pulses 2+. Skin: Warm and dry. Musculoskeletal: No kyphosis. Neuropsychiatric: Alert and oriented x3, affect grossly appropriate.  ECG: I personally reviewed the tracing from 01/05/2017 which showed sinus rhythm with left bundle branch block.  Recent Labwork: 11/01/2017: ALT 13; AST 14; BUN 13; Creatinine, Ser 0.48; Hemoglobin 11.7; Platelets 228; Potassium 4.5; Sodium 142   Other Studies Reviewed Today:  Echocardiogram7/26/2017: Study Conclusions  - Left ventricle: The cavity size was normal. Wall thickness was normal. Systolic function was normal. The estimated ejection fraction was in the range of 50% to 55%. Features are consistent with a pseudonormal left ventricular filling pattern, with concomitant abnormal relaxation and increased filling pressure (grade 2 diastolic dysfunction). - Ventricular septum: Septal motion showed abnormal function and dyssynergy. - Aortic valve: Mildly calcified annulus. Trileaflet. - Mitral valve: There was mild regurgitation. - Left atrium: The atrium  was mildly dilated. - Right atrium: Central venous pressure (est): 3 mm Hg. - Atrial septum: No defect or patent foramen ovale was identified. - Tricuspid valve: There was trivial regurgitation. - Pulmonary arteries: PA peak pressure: 22 mm Hg (S). - Pericardium, extracardiac: A trivial pericardial effusion was identified posterior to the heart.  Impressions:  - Normal LV wall thickness with LVEF 50-55%. Septal dyssynergy consistent with left bundle branch block. Grade 2 diastolic dysfunction with increased LV filling pressure. Mild left atrial enlargement. Mild mitral regurgitation. Mildly calcified aortic annulus. Trivial tricuspid regurgitation with normal PASP 22 mmHg. Trivial posterior pericardial effusion.  Lexiscan Myoview 05/01/2014 [facility]): No ischemic defects reported, LVEF 48%.  Assessment and Plan:  1.  History of palpitations and episodic PSVT, well-controlled on current Cardizem CD dosing.  Follow-up ECG reviewed and stable.  Continue with observation for now.  2.  Heart murmur in aortic position, history of both aortic annular calcification and mitral regurgitation.  Follow-up echocardiogram to be obtained in comparison to 2017 study.  3.  Chronic left bundle branch block.  No history of syncope.  4.  Type 2 diabetes mellitus, continues to follow with Dr. Pleas Koch.  Current medicines were reviewed with the patient today.   Orders Placed This Encounter  Procedures  . EKG 12-Lead  . ECHOCARDIOGRAM COMPLETE    Disposition: Follow-up in 1 year.  Signed, Satira Sark, MD, [facility] 01/09/2018 3:44 PM    [facility] [address], [place], [place] [address] Phone: [phone]; Fax: [phone]

## 2018-01-09 ENCOUNTER — Encounter: Payer: Self-pay | Admitting: Cardiology

## 2018-01-09 ENCOUNTER — Ambulatory Visit: Payer: Medicare Other | Admitting: Cardiology

## 2018-01-09 VITALS — BP 130/58 | HR 68 | Ht 62.0 in | Wt 204.8 lb

## 2018-01-09 DIAGNOSIS — E119 Type 2 diabetes mellitus without complications: Secondary | ICD-10-CM | POA: Diagnosis not present

## 2018-01-09 DIAGNOSIS — I471 Supraventricular tachycardia: Secondary | ICD-10-CM

## 2018-01-09 DIAGNOSIS — Z794 Long term (current) use of insulin: Secondary | ICD-10-CM

## 2018-01-09 DIAGNOSIS — I340 Nonrheumatic mitral (valve) insufficiency: Secondary | ICD-10-CM

## 2018-01-09 DIAGNOSIS — I447 Left bundle-branch block, unspecified: Secondary | ICD-10-CM

## 2018-01-09 DIAGNOSIS — R011 Cardiac murmur, unspecified: Secondary | ICD-10-CM

## 2018-01-09 NOTE — Patient Instructions (Addendum)
Your physician wants you to follow-up in: [place] will receive a reminder letter in the mail two months in advance. If you don't receive a letter, please call our office to schedule the follow-up appointment.  Your physician recommends that you continue on your current medications as directed. Please refer to the Current Medication list given to you today.  Your physician has requested that you have an echocardiogram. Echocardiography is a painless test that uses sound waves to create images of your heart. It provides your doctor with information about the size and shape of your heart and how well your heart's chambers and valves are working. This procedure takes approximately one hour. There are no restrictions for this procedure.  Thank you for choosing [facility]!!

## 2018-01-16 DIAGNOSIS — I5032 Chronic diastolic (congestive) heart failure: Secondary | ICD-10-CM | POA: Diagnosis not present

## 2018-01-16 DIAGNOSIS — I10 Essential (primary) hypertension: Secondary | ICD-10-CM | POA: Diagnosis not present

## 2018-01-16 DIAGNOSIS — D649 Anemia, unspecified: Secondary | ICD-10-CM | POA: Diagnosis not present

## 2018-01-16 DIAGNOSIS — E1165 Type 2 diabetes mellitus with hyperglycemia: Secondary | ICD-10-CM | POA: Diagnosis not present

## 2018-01-16 DIAGNOSIS — D500 Iron deficiency anemia secondary to blood loss (chronic): Secondary | ICD-10-CM | POA: Diagnosis not present

## 2018-01-16 DIAGNOSIS — E870 Hyperosmolality and hypernatremia: Secondary | ICD-10-CM | POA: Diagnosis not present

## 2018-01-16 DIAGNOSIS — K219 Gastro-esophageal reflux disease without esophagitis: Secondary | ICD-10-CM | POA: Diagnosis not present

## 2018-01-18 DIAGNOSIS — Z0001 Encounter for general adult medical examination with abnormal findings: Secondary | ICD-10-CM | POA: Diagnosis not present

## 2018-01-18 DIAGNOSIS — E1165 Type 2 diabetes mellitus with hyperglycemia: Secondary | ICD-10-CM | POA: Diagnosis not present

## 2018-01-18 DIAGNOSIS — I340 Nonrheumatic mitral (valve) insufficiency: Secondary | ICD-10-CM | POA: Diagnosis not present

## 2018-01-18 DIAGNOSIS — Z23 Encounter for immunization: Secondary | ICD-10-CM | POA: Diagnosis not present

## 2018-01-18 DIAGNOSIS — I471 Supraventricular tachycardia: Secondary | ICD-10-CM | POA: Diagnosis not present

## 2018-01-18 DIAGNOSIS — E782 Mixed hyperlipidemia: Secondary | ICD-10-CM | POA: Diagnosis not present

## 2018-01-19 DIAGNOSIS — D225 Melanocytic nevi of trunk: Secondary | ICD-10-CM | POA: Diagnosis not present

## 2018-01-19 DIAGNOSIS — Z1283 Encounter for screening for malignant neoplasm of skin: Secondary | ICD-10-CM | POA: Diagnosis not present

## 2018-01-19 DIAGNOSIS — Z85820 Personal history of malignant melanoma of skin: Secondary | ICD-10-CM | POA: Diagnosis not present

## 2018-01-19 DIAGNOSIS — Z08 Encounter for follow-up examination after completed treatment for malignant neoplasm: Secondary | ICD-10-CM | POA: Diagnosis not present

## 2018-01-19 DIAGNOSIS — L570 Actinic keratosis: Secondary | ICD-10-CM | POA: Diagnosis not present

## 2018-01-19 DIAGNOSIS — L602 Onychogryphosis: Secondary | ICD-10-CM | POA: Diagnosis not present

## 2018-01-30 DIAGNOSIS — H353132 Nonexudative age-related macular degeneration, bilateral, intermediate dry stage: Secondary | ICD-10-CM | POA: Diagnosis not present

## 2018-01-30 DIAGNOSIS — H2513 Age-related nuclear cataract, bilateral: Secondary | ICD-10-CM | POA: Diagnosis not present

## 2018-01-30 DIAGNOSIS — H35033 Hypertensive retinopathy, bilateral: Secondary | ICD-10-CM | POA: Diagnosis not present

## 2018-01-30 DIAGNOSIS — H25013 Cortical age-related cataract, bilateral: Secondary | ICD-10-CM | POA: Diagnosis not present

## 2018-01-30 DIAGNOSIS — H2511 Age-related nuclear cataract, right eye: Secondary | ICD-10-CM | POA: Diagnosis not present

## 2018-02-01 ENCOUNTER — Other Ambulatory Visit: Payer: Self-pay

## 2018-02-01 ENCOUNTER — Telehealth: Payer: Self-pay | Admitting: *Deleted

## 2018-02-01 ENCOUNTER — Ambulatory Visit (INDEPENDENT_AMBULATORY_CARE_PROVIDER_SITE_OTHER): Payer: Medicare Other

## 2018-02-01 DIAGNOSIS — R011 Cardiac murmur, unspecified: Secondary | ICD-10-CM

## 2018-02-01 NOTE — Telephone Encounter (Signed)
-----   Message from Satira Sark, MD sent at 02/01/2018 12:56 PM EDT ----- Results reviewed.  LVEF normal at 55 to 60%.  There is calcification of the aortic and mitral annulus with only mild mitral regurgitation.  No significant change compared to prior study.  Continue with follow-up plan. A copy of this test should be forwarded to [place], Virgina Evener, MD.

## 2018-02-01 NOTE — Telephone Encounter (Signed)
Pt aware - routed to pcp  

## 2018-02-07 DIAGNOSIS — H2511 Age-related nuclear cataract, right eye: Secondary | ICD-10-CM | POA: Diagnosis not present

## 2018-02-07 DIAGNOSIS — H25811 Combined forms of age-related cataract, right eye: Secondary | ICD-10-CM | POA: Diagnosis not present

## 2018-02-20 DIAGNOSIS — M1712 Unilateral primary osteoarthritis, left knee: Secondary | ICD-10-CM | POA: Diagnosis not present

## 2018-02-20 DIAGNOSIS — M7122 Synovial cyst of popliteal space [Baker], left knee: Secondary | ICD-10-CM | POA: Diagnosis not present

## 2018-02-23 ENCOUNTER — Encounter (HOSPITAL_COMMUNITY): Payer: Self-pay | Admitting: Genetic Counselor

## 2018-02-23 ENCOUNTER — Ambulatory Visit (HOSPITAL_BASED_OUTPATIENT_CLINIC_OR_DEPARTMENT_OTHER): Payer: Medicare Other | Admitting: Genetic Counselor

## 2018-02-23 ENCOUNTER — Other Ambulatory Visit (HOSPITAL_COMMUNITY): Payer: Medicare Other

## 2018-02-23 DIAGNOSIS — C439 Malignant melanoma of skin, unspecified: Secondary | ICD-10-CM | POA: Insufficient documentation

## 2018-02-23 DIAGNOSIS — Z8049 Family history of malignant neoplasm of other genital organs: Secondary | ICD-10-CM

## 2018-02-23 DIAGNOSIS — Z803 Family history of malignant neoplasm of breast: Secondary | ICD-10-CM

## 2018-02-23 DIAGNOSIS — Z808 Family history of malignant neoplasm of other organs or systems: Secondary | ICD-10-CM

## 2018-02-23 DIAGNOSIS — Z800 Family history of malignant neoplasm of digestive organs: Secondary | ICD-10-CM

## 2018-02-23 DIAGNOSIS — C4361 Malignant melanoma of right upper limb, including shoulder: Secondary | ICD-10-CM

## 2018-02-23 DIAGNOSIS — Z809 Family history of malignant neoplasm, unspecified: Secondary | ICD-10-CM | POA: Diagnosis not present

## 2018-02-23 DIAGNOSIS — Z1379 Encounter for other screening for genetic and chromosomal anomalies: Secondary | ICD-10-CM

## 2018-02-23 NOTE — Progress Notes (Signed)
REFERRING PROVIDER: Curlene Labrum, MD [name],  [id number]  PRIMARY PROVIDER:  Curlene Labrum, MD  PRIMARY REASON FOR VISIT:  1. Family history of melanoma   2. Malignant melanoma of right upper extremity including shoulder (HCC)   3. Family history of breast cancer   4. Family history of uterine cancer   5. Family history of stomach cancer      HISTORY OF PRESENT ILLNESS:   Kristina Berry, a 72 y.o. female, was seen for a  cancer genetics consultation at the request of Dr. Pleas Koch due to a personal and family history of cancer.  Kristina Berry presents to clinic today to discuss the possibility of a hereditary predisposition to cancer, genetic testing, and to further clarify her future cancer risks, as well as potential cancer risks for family members.   When Kristina Berry was in her 20's, she was diagnosed with melanoma of the right arm. This was treated with Mose surgery. The patient's sister underwent genetic testing and was identified with a single pathogenic MUTYH mutation.    CANCER HISTORY:   No history exists.     HORMONAL RISK FACTORS:  Menarche was at age 13.  First live birth at age 27.  OCP use for approximately 22 years.  Ovaries intact: yes.  Hysterectomy: no.  Menopausal status: postmenopausal.  HRT use: 0 years. Colonoscopy: yes; normal. Mammogram within the last year: yes. Number of breast biopsies: 1. Up to date with pelvic exams:  n/a. Any excessive radiation exposure in the past:  no  Past Medical History:  Diagnosis Date  . Anemia   . Anxiety   . Baker's cyst of knee   . Chronic low back pain   . Diastolic dysfunction   . Essential hypertension   . Family history of breast cancer   . Family history of melanoma   . Family history of stomach cancer   . Family history of uterine cancer   . GERD (gastroesophageal reflux disease)   . Hyperlipidemia   . Iron deficiency anemia 05/18/2016  . LBBB (left bundle branch block)   .  Melanoma ([place])   . Mitral regurgitation    Mild to moderate 2011  . Skin cancer    basal cell removed from face/melanoma removed from rt arm  . Type 2 diabetes mellitus ([place])     Past Surgical History:  Procedure Laterality Date  . ADENOIDECTOMY    . BACK SURGERY  05/2014  . BIOPSY  05/28/2016   Procedure: BIOPSY;  Surgeon: Danie Binder, MD;  [facility];  Service: Endoscopy;;  duodenal and gastric  . CHOLECYSTECTOMY  2009  . COLONOSCOPY N/A 05/28/2016   Dr. Oneida Alar: one 8 mm tubular adenomas, diverticulosis in recto-sigmoid, sigmoid, and descending. Colonoscopy in 5-10 years if benefits outweigh the risks  . ESOPHAGOGASTRODUODENOSCOPY N/A 05/28/2016   Dr. Oneida Alar: multiple benign-appearing gastric polyps, gastritis  . GIVENS CAPSULE STUDY N/A 06/23/2016   Dr. Oneida Alar: mild ileitis  . MELANOMA SURGERY Right   . POLYPECTOMY  05/28/2016   Procedure: POLYPECTOMY;  Surgeon: Danie Binder, MD;  [facility];  Service: Endoscopy;;  ascending colon   . SKIN CANCER EXCISION     Basal cell removal from left side of face  . TONSILLECTOMY      Social History   Socioeconomic History  . Marital status: Married    Spouse name: Not on file  . Number of children: Not on file  .  Years of education: Not on file  . Highest education level: Not on file  Occupational History  . Not on file  Social Needs  . Financial resource strain: Not on file  . Food insecurity:    Worry: Not on file    Inability: Not on file  . Transportation needs:    Medical: Not on file    Non-medical: Not on file  Tobacco Use  . Smoking status: Never Smoker  . Smokeless tobacco: Never Used  Substance and Sexual Activity  . Alcohol use: No    Alcohol/week: 0.0 standard drinks  . Drug use: No  . Sexual activity: Not on file    Comment: married  Lifestyle  . Physical activity:    Days per week: Not on file    Minutes per session: Not on file  . Stress: Not on file  Relationships  . Social  connections:    Talks on phone: Not on file    Gets together: Not on file    Attends religious service: Not on file    Active member of club or organization: Not on file    Attends meetings of clubs or organizations: Not on file    Relationship status: Not on file  Other Topics Concern  . Not on file  Social History Narrative  . Not on file     FAMILY HISTORY:  We obtained a detailed, 4-generation family history.  Significant diagnoses are listed below: Family History  Problem Relation Age of Onset  . Osteoporosis Mother   . Dementia Mother   . Diabetes Mother   . Melanoma Mother        dx in her 60s  . Breast cancer Sister 6       triple negative  . Heart Problems Father   . Diabetes Father   . Breast cancer Sister 65  . Learning disabilities Sister   . COPD Sister   . Lung cancer Sister   . Breast cancer Maternal Aunt 48  . Stomach cancer Maternal Uncle   . Uterine cancer Other 35  . Colon cancer Neg Hx     The patient has one daughter and one granddaughter, who are both cancer free.  She has five sisters and one brother.  Two sisters had breast cancer, one at 66 and the other at 63.  One sister has lung cancer, and this sister has a daughter with uterine cancer at 59.  The patient's parents are deceased.  The father died at 49.  He had four brothers and one sister who are cancer free.  The paternal grandparents are deceased.  The patient's mother had melanoma in her 16's.  She had a brother with stomach cancer and a sister with breast cancer at 35.  The maternal grandparents are deceased from non cancer related issues.  Kristina Berry is aware of previous family history of genetic testing for hereditary cancer risks. Patient's maternal ancestors are of caucasian descent, and paternal ancestors are of caucasian descent. There is no reported Ashkenazi Jewish ancestry. There is no known consanguinity.  GENETIC COUNSELING ASSESSMENT: Kristina Berry is a 72 y.o. female  with a personal and family history of cancer which is somewhat suggestive of a hereditary cancer syndrome and predisposition to cancer. We, therefore, discussed and recommended the following at today's visit.   DISCUSSION: We discussed that about 5-10% of both melanoma and breast cancer are hereditary with most cases due to BRCA mutations (breast cancer) or CDKN2A (melanoma).  There are other genes associated with hereditary breast and melanoma syndromes.  Based on early onset breast cancers and early onset uterine cancers, as well as the patient's early melanoma and her mothers' early 15, there is a risk for a hereditary cancer syndrome.  The patient's sister was found to have a single pathogenic MUTYH mutation.  This is an autosomal recessive condition, therefore her sister is a carrier and not affected.  The patient has a 50% chance of also have this same mutation.  There is a  Very small chance that the patient could carry a second MUTYH mutation that could cause her to have MUTYH-associated polyposis (MAP), however, we would expect her to have more colon polyps.  We reviewed the characteristics, features and inheritance patterns of hereditary cancer syndromes. We also discussed genetic testing, including the appropriate family members to test, the process of testing, insurance coverage and turn-around-time for results. We discussed the implications of a negative, positive and/or variant of uncertain significant result. We recommended Kristina Berry pursue genetic testing for the multicancergene panel. The Multi-Gene Panel offered by [facility] includes sequencing and/or deletion duplication testing of the following 84 genes: AIP, ALK, APC, ATM, AXIN2,BAP1,  BARD1, BLM, BMPR1A, BRCA1, BRCA2, BRIP1, CASR, CDC73, CDH1, CDK4, CDKN1B, CDKN1C, CDKN2A (p14ARF), CDKN2A (p16INK4a), CEBPA, CHEK2, CTNNA1, DICER1, DIS3L2, EGFR (c.2369C>T, p.Thr790Met variant only), EPCAM (Deletion/duplication testing only), FH, FLCN,  GATA2, GPC3, GREM1 (Promoter region deletion/duplication testing only), HOXB13 (c.251G>A, p.Gly84Glu), HRAS, KIT, MAX, MEN1, MET, MITF (c.952G>A, p.Glu318Lys variant only), MLH1, MSH2, MSH3, MSH6, MUTYH, NBN, NF1, NF2, NTHL1, PALB2, PDGFRA, PHOX2B, PMS2, POLD1, POLE, POT1, PRKAR1A, PTCH1, PTEN, RAD50, RAD51C, RAD51D, RB1, RECQL4, RET, RUNX1, SDHAF2, SDHA (sequence changes only), SDHB, SDHC, SDHD, SMAD4, SMARCA4, SMARCB1, SMARCE1, STK11, SUFU, TERC, TERT, TMEM127, TP53, TSC1, TSC2, VHL, WRN and WT1.    Based on Kristina Berry's personal and family history of cancer, she meets medical criteria for genetic testing. Despite that she meets criteria, she may still have an out of pocket cost. We discussed that if her out of pocket cost for testing is over $100, the laboratory will call and confirm whether she wants to proceed with testing.  If the out of pocket cost of testing is less than $100 she will be billed by the genetic testing laboratory.   PLAN: After considering the risks, benefits, and limitations, Kristina Berry  provided informed consent to pursue genetic testing and the blood sample was sent to [facility] for analysis of the Multi cancer gene panel. Results should be available within approximately 2-3 weeks' time, at which point they will be disclosed by telephone to Kristina Berry, as will any additional recommendations warranted by these results. Kristina Berry will receive a summary of her genetic counseling visit and a copy of her results once available. This information will also be available in Epic. We encouraged Kristina Berry to remain in contact with cancer genetics annually so that we can continuously update the family history and inform her of any changes in cancer genetics and testing that may be of benefit for her family. Kristina Berry's questions were answered to her satisfaction today. Our contact information was provided should additional questions or concerns arise.  Lastly, we encouraged  Kristina Berry to remain in contact with cancer genetics annually so that we can continuously update the family history and inform her of any changes in cancer genetics and testing that may be of benefit for this family.   Ms.  Berry's questions were answered to her satisfaction  today. Our contact information was provided should additional questions or concerns arise. Thank you for the referral and allowing [place] to share in the care of your patient.    P. Florene Glen, [place], [facility] Certified Genetic Counselor [email or web address] phone: [phone]  The patient was seen for a total of 35 minutes in face-to-face genetic counseling.  This patient was discussed with Drs. Magrinat, Lindi Adie and/or Burr Medico who agrees with the above.    _______________________________________________________________________ For Office Staff:  Number of people involved in session: 6 Was an Intern/ student involved with case: no

## 2018-03-03 ENCOUNTER — Encounter: Payer: Self-pay | Admitting: Genetic Counselor

## 2018-03-03 ENCOUNTER — Telehealth: Payer: Self-pay | Admitting: Genetic Counselor

## 2018-03-03 DIAGNOSIS — Z1379 Encounter for other screening for genetic and chromosomal anomalies: Secondary | ICD-10-CM | POA: Insufficient documentation

## 2018-03-03 NOTE — Telephone Encounter (Signed)
Revealed that she had the single MUTYH mutation found in her sister, but that the remainder of the testing was negative.  She will let her daughter know to call and schedule the appointment.

## 2018-03-04 ENCOUNTER — Other Ambulatory Visit: Payer: Self-pay | Admitting: Cardiology

## 2018-03-07 DIAGNOSIS — M7122 Synovial cyst of popliteal space [Baker], left knee: Secondary | ICD-10-CM | POA: Diagnosis not present

## 2018-03-12 ENCOUNTER — Ambulatory Visit: Payer: Self-pay | Admitting: Genetic Counselor

## 2018-03-12 DIAGNOSIS — Z1379 Encounter for other screening for genetic and chromosomal anomalies: Secondary | ICD-10-CM

## 2018-03-12 NOTE — Progress Notes (Signed)
HPI:  Kristina Berry was previously seen in the [place] clinic due to a personal and family history of cancer and concerns regarding a hereditary predisposition to cancer. Please refer to our prior cancer genetics clinic note for more information regarding Kristina Berry's medical, social and family histories, and our assessment and recommendations, at the time. Kristina Berry's recent genetic test results were disclosed to her, as were recommendations warranted by these results. These results and recommendations are discussed in more detail below.  CANCER HISTORY:    Melanoma ([place])    Initial Diagnosis    Melanoma ([place])    03/02/2018 Genetic Testing    MUTYH c.1187G>A heterozygous pathogenic variant found on the multicancer panel.  The Multi-Gene Panel offered by [facility] includes sequencing and/or deletion duplication testing of the following 84 genes: AIP, ALK, APC, ATM, AXIN2,BAP1,  BARD1, BLM, BMPR1A, BRCA1, BRCA2, BRIP1, CASR, CDC73, CDH1, CDK4, CDKN1B, CDKN1C, CDKN2A (p14ARF), CDKN2A (p16INK4a), CEBPA, CHEK2, CTNNA1, DICER1, DIS3L2, EGFR (c.2369C>T, p.Thr790Met variant only), EPCAM (Deletion/duplication testing only), FH, FLCN, GATA2, GPC3, GREM1 (Promoter region deletion/duplication testing only), HOXB13 (c.251G>A, p.Gly84Glu), HRAS, KIT, MAX, MEN1, MET, MITF (c.952G>A, p.Glu318Lys variant only), MLH1, MSH2, MSH3, MSH6, MUTYH, NBN, NF1, NF2, NTHL1, PALB2, PDGFRA, PHOX2B, PMS2, POLD1, POLE, POT1, PRKAR1A, PTCH1, PTEN, RAD50, RAD51C, RAD51D, RB1, RECQL4, RET, RUNX1, SDHAF2, SDHA (sequence changes only), SDHB, SDHC, SDHD, SMAD4, SMARCA4, SMARCB1, SMARCE1, STK11, SUFU, TERC, TERT, TMEM127, TP53, TSC1, TSC2, VHL, WRN and WT1.  The report date is 03/02/2018.     FAMILY HISTORY:  We obtained a detailed, 4-generation family history.  Significant diagnoses are listed below: Family History  Problem Relation Age of Onset  . Osteoporosis Mother   . Dementia Mother   . Diabetes Mother   .  Melanoma Mother        dx in her 58s  . Breast cancer Sister 20       triple negative  . Heart Problems Father   . Diabetes Father   . Breast cancer Sister 82  . Learning disabilities Sister   . COPD Sister   . Lung cancer Sister   . Breast cancer Maternal Aunt 48  . Stomach cancer Maternal Uncle   . Uterine cancer Other 35  . Colon cancer Neg Hx     The patient has one daughter and one granddaughter, who are both cancer free.  She has five sisters and one brother.  Two sisters had breast cancer, one at 83 and the other at 39.  One sister has lung cancer, and this sister has a daughter with uterine cancer at 84.  The patient's parents are deceased.  The father died at 26.  He had four brothers and one sister who are cancer free.  The paternal grandparents are deceased.  The patient's mother had melanoma in her 56's.  She had a brother with stomach cancer and a sister with breast cancer at 19.  The maternal grandparents are deceased from non cancer related issues.  Kristina Berry is aware of previous family history of genetic testing for hereditary cancer risks. Patient's maternal ancestors are of caucasian descent, and paternal ancestors are of caucasian descent. There is no reported Ashkenazi Jewish ancestry. There is no known consanguinity.  GENETIC TEST RESULTS: Genetic testing reported out on March 02, 2018 through the multi-cancer panel found one pathogenic mutation in a gene called MUTYH (also known as MYH). The mutation is called, MUTYH,c.1187G>A. We reviewed recessive inheritance and discussed when an individual has two MYH gene  mutations, this is associated with an increased risk for adenomatous (precancerous) colon polyps. Recently, the [employer]  (NCCN: V.2.2019) provided national guidelines to manage the colon cancer risk found with single (heterozygous) MUTYH mutation.  These guidelines include having a MUTYH pathogenic mutation and:  Personal  history of colon cancer  Follow instructions provided by your physician based on your personal history.  Do not have a personal history of colon cancer but have a parent/sibling/child with colon cancer: Colonoscopy every 5 years starting at age 80 or 56 years younger than the earliest age of onset, whichever is younger.  Do not have a personal history of colon cancer and do not have a parent/sibling/child with colon cancer: Data is uncertain whether specialized screening is needed.  However, to our current knowledge, individuals with only one MYH gene mutation do not have an increased risk for colon polyps.    RECOMMENDATIONS FOR FAMILY MEMBERS:  Individuals in this family might be at some increased risk of developing cancer, over the general population risk, simply due to the family history of cancer.  We recommended women in this family have a yearly mammogram beginning at age 76, or 1 years younger than the earliest onset of cancer, an annual clinical breast exam, and perform monthly breast self-exams. Women in this family should also have a gynecological exam as recommended by their primary provider. All family members should have a colonoscopy by age 32.  Based on Kristina Berry's family history, we recommended her daughter have genetic counseling and testing. Kristina Berry will let us know if we can be of any assistance in coordinating genetic counseling and/or testing for this family member.   FOLLOW-UP: Lastly, we discussed with Kristina Berry that cancer genetics is a rapidly advancing field and it is possible that new genetic tests will be appropriate for her and/or her family members in the future. We encouraged her to remain in contact with cancer genetics on an annual basis so we can update her personal and family histories and let her know of advances in cancer genetics that may benefit this family.   Our contact number was provided. Kristina Berry's questions were answered to her satisfaction,  and she knows she is welcome to call us at anytime with additional questions or concerns.   Roma Kayser, MS, [facility] Certified Genetic Counselor [email or web address]

## 2018-03-13 ENCOUNTER — Other Ambulatory Visit (HOSPITAL_COMMUNITY): Payer: Self-pay | Admitting: Orthopedic Surgery

## 2018-03-13 DIAGNOSIS — H2512 Age-related nuclear cataract, left eye: Secondary | ICD-10-CM | POA: Diagnosis not present

## 2018-03-13 DIAGNOSIS — M7989 Other specified soft tissue disorders: Secondary | ICD-10-CM

## 2018-03-13 DIAGNOSIS — M25562 Pain in left knee: Secondary | ICD-10-CM

## 2018-03-13 DIAGNOSIS — M79605 Pain in left leg: Secondary | ICD-10-CM

## 2018-03-13 DIAGNOSIS — M7122 Synovial cyst of popliteal space [Baker], left knee: Secondary | ICD-10-CM

## 2018-03-13 DIAGNOSIS — H25012 Cortical age-related cataract, left eye: Secondary | ICD-10-CM | POA: Diagnosis not present

## 2018-03-17 DIAGNOSIS — J069 Acute upper respiratory infection, unspecified: Secondary | ICD-10-CM | POA: Diagnosis not present

## 2018-03-17 DIAGNOSIS — R011 Cardiac murmur, unspecified: Secondary | ICD-10-CM | POA: Diagnosis not present

## 2018-03-17 DIAGNOSIS — J40 Bronchitis, not specified as acute or chronic: Secondary | ICD-10-CM | POA: Diagnosis not present

## 2018-03-20 ENCOUNTER — Ambulatory Visit (HOSPITAL_COMMUNITY)
Admission: RE | Admit: 2018-03-20 | Discharge: 2018-03-20 | Disposition: A | Discharge status: Home / Self Care | Payer: Medicare Other | Source: Ambulatory Visit | Attending: Orthopedic Surgery | Admitting: Orthopedic Surgery

## 2018-03-20 DIAGNOSIS — M25461 Effusion, right knee: Secondary | ICD-10-CM | POA: Insufficient documentation

## 2018-03-20 DIAGNOSIS — M25562 Pain in left knee: Secondary | ICD-10-CM | POA: Insufficient documentation

## 2018-03-20 DIAGNOSIS — X58XXXA Exposure to other specified factors, initial encounter: Secondary | ICD-10-CM | POA: Diagnosis not present

## 2018-03-20 DIAGNOSIS — M7122 Synovial cyst of popliteal space [Baker], left knee: Secondary | ICD-10-CM

## 2018-03-20 DIAGNOSIS — M7989 Other specified soft tissue disorders: Secondary | ICD-10-CM | POA: Insufficient documentation

## 2018-03-20 DIAGNOSIS — S83242A Other tear of medial meniscus, current injury, left knee, initial encounter: Secondary | ICD-10-CM | POA: Diagnosis not present

## 2018-03-20 DIAGNOSIS — M1712 Unilateral primary osteoarthritis, left knee: Secondary | ICD-10-CM | POA: Insufficient documentation

## 2018-03-20 DIAGNOSIS — M79605 Pain in left leg: Secondary | ICD-10-CM | POA: Diagnosis not present

## 2018-03-20 DIAGNOSIS — R600 Localized edema: Secondary | ICD-10-CM | POA: Insufficient documentation

## 2018-03-20 DIAGNOSIS — S83412A Sprain of medial collateral ligament of left knee, initial encounter: Secondary | ICD-10-CM | POA: Diagnosis not present

## 2018-04-17 DIAGNOSIS — S83242A Other tear of medial meniscus, current injury, left knee, initial encounter: Secondary | ICD-10-CM | POA: Diagnosis not present

## 2018-04-25 DIAGNOSIS — H2512 Age-related nuclear cataract, left eye: Secondary | ICD-10-CM | POA: Diagnosis not present

## 2018-04-25 DIAGNOSIS — H25812 Combined forms of age-related cataract, left eye: Secondary | ICD-10-CM | POA: Diagnosis not present

## 2018-05-09 ENCOUNTER — Inpatient Hospital Stay (HOSPITAL_COMMUNITY): Payer: Medicare Other | Attending: Hematology

## 2018-05-09 DIAGNOSIS — D509 Iron deficiency anemia, unspecified: Secondary | ICD-10-CM | POA: Insufficient documentation

## 2018-05-09 DIAGNOSIS — D508 Other iron deficiency anemias: Secondary | ICD-10-CM

## 2018-05-09 LAB — COMPREHENSIVE METABOLIC PANEL
ALBUMIN: 3.6 g/dL (ref 3.5–5.0)
ALT: 16 U/L (ref 0–44)
AST: 19 U/L (ref 15–41)
Alkaline Phosphatase: 61 U/L (ref 38–126)
Anion gap: 9 (ref 5–15)
BILIRUBIN TOTAL: 0.7 mg/dL (ref 0.3–1.2)
BUN: 18 mg/dL (ref 8–23)
CO2: 29 mmol/L (ref 22–32)
Calcium: 9 mg/dL (ref 8.9–10.3)
Chloride: 104 mmol/L (ref 98–111)
Creatinine, Ser: 0.59 mg/dL (ref 0.44–1.00)
GFR calc Af Amer: >60 mL/min (ref >60)
GFR calc non Af Amer: >60 mL/min (ref >60)
GLUCOSE: 156 mg/dL — AB (ref 70–99)
Potassium: 4.9 mmol/L (ref 3.5–5.1)
Sodium: 142 mmol/L (ref 135–145)
TOTAL PROTEIN: 6.4 g/dL — AB (ref 6.5–8.1)

## 2018-05-09 LAB — CBC WITH DIFFERENTIAL/PLATELET
Abs Immature Granulocytes: 0.03 10*3/uL (ref 0.00–0.07)
BASOS PCT: 1 %
Basophils Absolute: 0.1 10*3/uL (ref 0.0–0.1)
EOS ABS: 0.2 10*3/uL (ref 0.0–0.5)
EOS PCT: 3 %
HCT: 35.8 % — ABNORMAL LOW (ref 36.0–46.0)
Hemoglobin: 11.1 g/dL — ABNORMAL LOW (ref 12.0–15.0)
Immature Granulocytes: 0 %
Lymphocytes Relative: 32 %
Lymphs Abs: 2.1 10*3/uL (ref 0.7–4.0)
MCH: 28.3 pg (ref 26.0–34.0)
MCHC: 31 g/dL (ref 30.0–36.0)
MCV: 91.3 fL (ref 80.0–100.0)
Monocytes Absolute: 0.5 10*3/uL (ref 0.1–1.0)
Monocytes Relative: 8 %
Neutro Abs: 3.8 10*3/uL (ref 1.7–7.7)
Neutrophils Relative %: 56 %
PLATELETS: 242 10*3/uL (ref 150–400)
RBC: 3.92 MIL/uL (ref 3.87–5.11)
RDW: 13.3 % (ref 11.5–15.5)
WBC: 6.7 10*3/uL (ref 4.0–10.5)
nRBC: 0 % (ref 0.0–0.2)

## 2018-05-09 LAB — FERRITIN: Ferritin: 80 ng/mL (ref 11–307)

## 2018-05-09 LAB — IRON AND TIBC
IRON: 58 ug/dL (ref 28–170)
SATURATION RATIOS: 21 % (ref 10.4–31.8)
TIBC: 277 ug/dL (ref 250–450)
UIBC: 219 ug/dL

## 2018-05-11 DIAGNOSIS — Z961 Presence of intraocular lens: Secondary | ICD-10-CM | POA: Diagnosis not present

## 2018-05-16 ENCOUNTER — Inpatient Hospital Stay (HOSPITAL_COMMUNITY): Payer: Medicare Other | Attending: Hematology | Admitting: Hematology

## 2018-05-16 ENCOUNTER — Other Ambulatory Visit: Payer: Self-pay

## 2018-05-16 ENCOUNTER — Encounter (HOSPITAL_COMMUNITY): Payer: Self-pay | Admitting: Hematology

## 2018-05-16 DIAGNOSIS — D509 Iron deficiency anemia, unspecified: Secondary | ICD-10-CM

## 2018-05-16 DIAGNOSIS — Z79899 Other long term (current) drug therapy: Secondary | ICD-10-CM | POA: Diagnosis not present

## 2018-05-16 DIAGNOSIS — D500 Iron deficiency anemia secondary to blood loss (chronic): Secondary | ICD-10-CM

## 2018-05-16 NOTE — Progress Notes (Signed)
Kristina Berry, [place] [address]   CLINIC:  Medical Oncology/Hematology  PCP:  Curlene Labrum, MD [place] [address] [phone]   REASON FOR VISIT:  Follow-up for iron deficiency anemia  CURRENT THERAPY: Iron IV PRN   INTERVAL HISTORY:  Kristina Berry 73 y.o. female returns for follow-up of iron deficiency anemia.  She was started on iron supplements with vitamin C twice daily 6 months ago.  She is tolerating it very well.  Denied any constipation or diarrhea associated with it.  Denies any bleeding per rectum or melena.  Appetite is 100% and energy levels are 75%.  Denies any infections or hospitalizations in the last 6 months.   REVIEW OF SYSTEMS:  Review of Systems  Constitutional: Negative for fatigue.  HENT:  Negative.   Eyes: Negative.   Respiratory: Negative.   Cardiovascular: Negative.   Gastrointestinal: Negative.   Endocrine: Negative.   Genitourinary: Negative.    Musculoskeletal: Negative.   Skin: Negative.   Neurological: Negative.   Hematological: Negative.   Psychiatric/Behavioral: Negative.      PAST MEDICAL/SURGICAL HISTORY:  Past Medical History:  Diagnosis Date  . Anemia   . Anxiety   . Baker's cyst of knee   . Chronic low back pain   . Diastolic dysfunction   . Essential hypertension   . Family history of breast cancer   . Family history of melanoma   . Family history of stomach cancer   . Family history of uterine cancer   . GERD (gastroesophageal reflux disease)   . Hyperlipidemia   . Iron deficiency anemia 05/18/2016  . LBBB (left bundle branch block)   . Melanoma ([place])   . Mitral regurgitation    Mild to moderate 2011  . Skin cancer    basal cell removed from face/melanoma removed from rt arm  . Type 2 diabetes mellitus ([place])    Past Surgical History:  Procedure Laterality Date  . ADENOIDECTOMY    . BACK SURGERY  05/2014  . BIOPSY  05/28/2016   Procedure: BIOPSY;  Surgeon: Danie Binder, MD;  [facility];  Service: Endoscopy;;  duodenal and gastric  . CHOLECYSTECTOMY  2009  . COLONOSCOPY N/A 05/28/2016   Dr. Oneida Alar: one 8 mm tubular adenomas, diverticulosis in recto-sigmoid, sigmoid, and descending. Colonoscopy in 5-10 years if benefits outweigh the risks  . ESOPHAGOGASTRODUODENOSCOPY N/A 05/28/2016   Dr. Oneida Alar: multiple benign-appearing gastric polyps, gastritis  . GIVENS CAPSULE STUDY N/A 06/23/2016   Dr. Oneida Alar: mild ileitis  . MELANOMA SURGERY Right   . POLYPECTOMY  05/28/2016   Procedure: POLYPECTOMY;  Surgeon: Danie Binder, MD;  Location: [facility];  Service: Endoscopy;;  ascending colon   . SKIN CANCER EXCISION     Basal cell removal from left side of face  . TONSILLECTOMY       SOCIAL HISTORY:  Social History   Socioeconomic History  . Marital status: Married    Spouse name: Not on file  . Number of children: Not on file  . Years of education: Not on file  . Highest education level: Not on file  Occupational History  . Not on file  Social Needs  . Financial resource strain: Not on file  . Food insecurity:    Worry: Not on file    Inability: Not on file  . Transportation needs:    Medical: Not on file    Non-medical: Not on  file  Tobacco Use  . Smoking status: Never Smoker  . Smokeless tobacco: Never Used  Substance and Sexual Activity  . Alcohol use: No    Alcohol/week: 0.0 standard drinks  . Drug use: No  . Sexual activity: Not on file    Comment: married  Lifestyle  . Physical activity:    Days per week: Not on file    Minutes per session: Not on file  . Stress: Not on file  Relationships  . Social connections:    Talks on phone: Not on file    Gets together: Not on file    Attends religious service: Not on file    Active member of club or organization: Not on file    Attends meetings of clubs or organizations: Not on file    Relationship status: Not on file  . Intimate partner violence:    Fear of current or  ex partner: Not on file    Emotionally abused: Not on file    Physically abused: Not on file    Forced sexual activity: Not on file  Other Topics Concern  . Not on file  Social History Narrative  . Not on file    FAMILY HISTORY:  Family History  Problem Relation Age of Onset  . Osteoporosis Mother   . Dementia Mother   . Diabetes Mother   . Melanoma Mother        dx in her 70s  . Breast cancer Sister 8       triple negative  . Heart Problems Father   . Diabetes Father   . Breast cancer Sister 44  . Learning disabilities Sister   . COPD Sister   . Lung cancer Sister   . Breast cancer Maternal Aunt 48  . Stomach cancer Maternal Uncle   . Uterine cancer Other 35  . Colon cancer Neg Hx     CURRENT MEDICATIONS:  Outpatient Encounter Medications as of 05/16/2018  Medication Sig  . aspirin 81 MG tablet Take 1 tablet by mouth daily.  Marland Kitchen atorvastatin (LIPITOR) 20 MG tablet Take 1 tablet by mouth daily.  . Cholecalciferol (VITAMIN D3) 2000 units TABS Take 1 tablet by mouth daily.  Marland Kitchen diltiazem (CARDIZEM CD) 240 MG 24 hr capsule TAKE 1 CAPSULE BY MOUTH  DAILY  . ferrous sulfate 325 (65 FE) MG tablet Take 325 mg by mouth daily with breakfast.  . furosemide (LASIX) 40 MG tablet Take 40 mg by mouth 2 (two) times daily.   . insulin glargine (LANTUS) 100 UNIT/ML injection Inject 20 Units into the skin at bedtime.  . irbesartan (AVAPRO) 150 MG tablet Take 1 tablet by mouth daily.  Marland Kitchen ketoconazole (NIZORAL) 2 % cream as needed.   . loratadine (CLARITIN) 10 MG tablet Take 10 mg by mouth daily.  . metFORMIN (GLUCOPHAGE) 1000 MG tablet Take 1 tablet by mouth 2 (two) times daily.  Marland Kitchen omeprazole (PRILOSEC) 20 MG capsule Take 1 capsule by mouth daily.  Marland Kitchen PARoxetine (PAXIL) 10 MG tablet Take 0.5 tablets by mouth daily.  Marland Kitchen PROAIR HFA 108 (90 Base) MCG/ACT inhaler INHALE 1 TO 2 PUFFS BY MOUTH EVERY 4 TO 6 HOURS AS NEEDED FOR SHORTNESS OF BREATH OR WHEEZING  . sitaGLIPtin (JANUVIA) 100 MG tablet Take  1 tablet by mouth daily.   No facility-administered encounter medications on file as of 05/16/2018.     ALLERGIES:  Allergies  Allergen Reactions  . Cefdinir Other (See Comments)    Stomach cramps  .  Nsaids Other (See Comments)    Stomach pain  . Sulfa Antibiotics Nausea Only     PHYSICAL EXAM:  ECOG Performance status: 0  I have reviewed her vitals.  Blood pressure is 145/97, pulse rate is 82, respiratory 16, temperature 98.7. Physical Exam Cardiovascular:     Rate and Rhythm: Normal rate and regular rhythm.     Heart sounds: Normal heart sounds.  Pulmonary:     Effort: Pulmonary effort is normal.     Breath sounds: Normal breath sounds.  Skin:    General: Skin is warm and dry.  Neurological:     Mental Status: She is alert and oriented to person, place, and time.    Chest: Bilateral clear to auscultation. CVS: S1-S2 regular rate and rhythm.  LABORATORY DATA:  I have reviewed the labs as listed.  CBC    Component Value Date/Time   WBC 6.7 05/09/2018 1202   RBC 3.92 05/09/2018 1202   HGB 11.1 (L) 05/09/2018 1202   HCT 35.8 (L) 05/09/2018 1202   PLT 242 05/09/2018 1202   MCV 91.3 05/09/2018 1202   MCH 28.3 05/09/2018 1202   MCHC 31.0 05/09/2018 1202   RDW 13.3 05/09/2018 1202   LYMPHSABS 2.1 05/09/2018 1202   MONOABS 0.5 05/09/2018 1202   EOSABS 0.2 05/09/2018 1202   BASOSABS 0.1 05/09/2018 1202   CMP Latest Ref Rng & Units 05/09/2018 11/01/2017 05/04/2017  Glucose 70 - 99 mg/dL 156(H) 122(H) 154(H)  BUN 8 - 23 mg/dL 18 13 16   Creatinine 0.44 - 1.00 mg/dL 0.59 0.48 0.54  Sodium 135 - 145 mmol/L 142 142 139  Potassium 3.5 - 5.1 mmol/L 4.9 4.5 4.4  Chloride 98 - 111 mmol/L 104 102 98(L)  CO2 22 - 32 mmol/L 29 33(H) 29  Calcium 8.9 - 10.3 mg/dL 9.0 9.2 9.3  Total Protein 6.5 - 8.1 g/dL 6.4(L) 6.4(L) 7.0  Total Bilirubin 0.3 - 1.2 mg/dL 0.7 0.8 0.9  Alkaline Phos 38 - 126 U/L 61 70 89  AST 15 - 41 U/L 19 14(L) 20  ALT 0 - 44 U/L 16 13 14            ASSESSMENT & PLAN:   Iron deficiency anemia 1.  Iron deficiency anemia: - History of iron deficiency state from GI blood loss from ileal erosions.  EGD and colonoscopy in February 2018 showed benign gastric polyps, moderate gastritis, external and internal hemorrhoids. -Denies any bleeding per rectum or melena.  Last Feraheme infusion was on 10/15/2016. -Patient started taking iron tablet with vitamin C twice daily at last visit 6 months ago. -We reviewed her blood work today.  Hemoglobin dropped to 11.1 from 11.7 previously.  Ferritin dropped to 80 from 103 in July. -She appears to be not absorbing oral iron.  Hence I have recommended Feraheme weekly x2.  We will reevaluate her in 4 months with labs.  2.  Family history: -She has 2 sisters with breast cancer.  One sister had lung cancer. -Patient had a personal history of melanoma. - She was evaluated by our genetic counselor.  She was found to have heterozygous for MUTYH mutation.  Homozygous mutation is associated with increased risk of 4 polyps.  She will follow normal guidelines.      Orders placed this encounter:  Orders Placed This Encounter  Procedures  . CBC with Differential  . Ferritin  . Iron and TIBC      Derek Jack, MD [place] [phone]

## 2018-05-16 NOTE — Assessment & Plan Note (Signed)
1.  Iron deficiency anemia: - History of iron deficiency state from GI blood loss from ileal erosions.  EGD and colonoscopy in February 2018 showed benign gastric polyps, moderate gastritis, external and internal hemorrhoids. -Denies any bleeding per rectum or melena.  Last Feraheme infusion was on 10/15/2016. -Patient started taking iron tablet with vitamin C twice daily at last visit 6 months ago. -We reviewed her blood work today.  Hemoglobin dropped to 11.1 from 11.7 previously.  Ferritin dropped to 80 from 103 in July. -She appears to be not absorbing oral iron.  Hence I have recommended Feraheme weekly x2.  We will reevaluate her in 4 months with labs.  2.  Family history: -She has 2 sisters with breast cancer.  One sister had lung cancer. -Patient had a personal history of melanoma. - She was evaluated by our genetic counselor.  She was found to have heterozygous for MUTYH mutation.  Homozygous mutation is associated with increased risk of 4 polyps.  She will follow normal guidelines.

## 2018-05-22 ENCOUNTER — Inpatient Hospital Stay (HOSPITAL_COMMUNITY): Payer: Medicare Other

## 2018-05-22 ENCOUNTER — Encounter (HOSPITAL_COMMUNITY): Payer: Self-pay

## 2018-05-22 VITALS — BP 143/59 | HR 79 | Temp 97.8°F | Resp 16

## 2018-05-22 DIAGNOSIS — D500 Iron deficiency anemia secondary to blood loss (chronic): Secondary | ICD-10-CM

## 2018-05-22 DIAGNOSIS — Z79899 Other long term (current) drug therapy: Secondary | ICD-10-CM | POA: Diagnosis not present

## 2018-05-22 DIAGNOSIS — D509 Iron deficiency anemia, unspecified: Secondary | ICD-10-CM | POA: Diagnosis not present

## 2018-05-22 MED ORDER — SODIUM CHLORIDE 0.9 % IV SOLN
INTRAVENOUS | Status: DC
  Administered 2018-05-22: 09:00:00 via INTRAVENOUS

## 2018-05-22 MED ORDER — SODIUM CHLORIDE 0.9 % IV SOLN
510.0000 mg | Freq: Once | INTRAVENOUS | Status: AC
  Administered 2018-05-22: 510 mg via INTRAVENOUS
  Filled 2018-05-22: qty 510

## 2018-05-22 NOTE — Progress Notes (Signed)
Feraheme given today per orders. Patient tolerated it well without problems. Vitals stable and discharged home from clinic ambulatory. Follow up as scheduled.  

## 2018-05-22 NOTE — Patient Instructions (Signed)
[facility] at [facility]  Discharge Instructions:   _______________________________________________________________  Thank you for choosing [facility] at [facility] to provide your oncology and hematology care.  To afford each patient quality time with our providers, please arrive at least 15 minutes before your scheduled appointment.  You need to re-schedule your appointment if you arrive 10 or more minutes late.  We strive to give you quality time with our providers, and arriving late affects you and other patients whose appointments are after yours.  Also, if you no show three or more times for appointments you may be dismissed from the clinic.  Again, thank you for choosing [facility] at [facility]. Our hope is that these requests will allow you access to exceptional care and in a timely manner. _______________________________________________________________  If you have questions after your visit, please contact our office at [phone] between the hours of 8:30 a.m. and 5:00 p.m. Voicemails left after 4:30 p.m. will not be returned until the following business day. _______________________________________________________________  For prescription refill requests, have your pharmacy contact our office. _______________________________________________________________  Recommendations made by the consultant and any test results will be sent to your referring physician. _______________________________________________________________ 

## 2018-05-25 DIAGNOSIS — L728 Other follicular cysts of the skin and subcutaneous tissue: Secondary | ICD-10-CM | POA: Diagnosis not present

## 2018-05-29 ENCOUNTER — Inpatient Hospital Stay (HOSPITAL_COMMUNITY): Payer: Medicare Other

## 2018-05-29 VITALS — BP 153/52 | HR 70 | Temp 97.1°F | Resp 18

## 2018-05-29 DIAGNOSIS — D509 Iron deficiency anemia, unspecified: Secondary | ICD-10-CM | POA: Diagnosis not present

## 2018-05-29 DIAGNOSIS — D500 Iron deficiency anemia secondary to blood loss (chronic): Secondary | ICD-10-CM

## 2018-05-29 DIAGNOSIS — Z79899 Other long term (current) drug therapy: Secondary | ICD-10-CM | POA: Diagnosis not present

## 2018-05-29 MED ORDER — SODIUM CHLORIDE 0.9 % IV SOLN
510.0000 mg | Freq: Once | INTRAVENOUS | Status: AC
  Administered 2018-05-29: 510 mg via INTRAVENOUS
  Filled 2018-05-29: qty 17

## 2018-05-29 MED ORDER — SODIUM CHLORIDE 0.9 % IV SOLN
INTRAVENOUS | Status: DC
  Administered 2018-05-29: 08:00:00 via INTRAVENOUS

## 2018-05-29 NOTE — Progress Notes (Signed)
Kristina Berry tolerated Feraheme infusion without incident or complaint. VSS. Pt monitored for 30 minutes post infusion. Discharged self ambulatory in satisfactory condition.

## 2018-05-29 NOTE — Patient Instructions (Signed)
[facility] at [facility]  Discharge Instructions:  You received an iron infusion today.  _______________________________________________________________  Thank you for choosing [facility] at [facility] to provide your oncology and hematology care.  To afford each patient quality time with our providers, please arrive at least 15 minutes before your scheduled appointment.  You need to re-schedule your appointment if you arrive 10 or more minutes late.  We strive to give you quality time with our providers, and arriving late affects you and other patients whose appointments are after yours.  Also, if you no show three or more times for appointments you may be dismissed from the clinic.  Again, thank you for choosing [facility] at [facility]. Our hope is that these requests will allow you access to exceptional care and in a timely manner. _______________________________________________________________  If you have questions after your visit, please contact our office at [phone] between the hours of 8:30 a.m. and 5:00 p.m. Voicemails left after 4:30 p.m. will not be returned until the following business day. _______________________________________________________________  For prescription refill requests, have your pharmacy contact our office. _______________________________________________________________  Recommendations made by the consultant and any test results will be sent to your referring physician. _______________________________________________________________ 

## 2018-07-14 DIAGNOSIS — I10 Essential (primary) hypertension: Secondary | ICD-10-CM | POA: Diagnosis not present

## 2018-07-14 DIAGNOSIS — K219 Gastro-esophageal reflux disease without esophagitis: Secondary | ICD-10-CM | POA: Diagnosis not present

## 2018-07-14 DIAGNOSIS — D649 Anemia, unspecified: Secondary | ICD-10-CM | POA: Diagnosis not present

## 2018-07-14 DIAGNOSIS — I5032 Chronic diastolic (congestive) heart failure: Secondary | ICD-10-CM | POA: Diagnosis not present

## 2018-07-14 DIAGNOSIS — E1165 Type 2 diabetes mellitus with hyperglycemia: Secondary | ICD-10-CM | POA: Diagnosis not present

## 2018-07-14 DIAGNOSIS — E875 Hyperkalemia: Secondary | ICD-10-CM | POA: Diagnosis not present

## 2018-07-17 DIAGNOSIS — I471 Supraventricular tachycardia: Secondary | ICD-10-CM | POA: Diagnosis not present

## 2018-07-17 DIAGNOSIS — I340 Nonrheumatic mitral (valve) insufficiency: Secondary | ICD-10-CM | POA: Diagnosis not present

## 2018-07-17 DIAGNOSIS — E1165 Type 2 diabetes mellitus with hyperglycemia: Secondary | ICD-10-CM | POA: Diagnosis not present

## 2018-07-17 DIAGNOSIS — I447 Left bundle-branch block, unspecified: Secondary | ICD-10-CM | POA: Diagnosis not present

## 2018-07-17 DIAGNOSIS — Z0001 Encounter for general adult medical examination with abnormal findings: Secondary | ICD-10-CM | POA: Diagnosis not present

## 2018-09-12 ENCOUNTER — Inpatient Hospital Stay (HOSPITAL_COMMUNITY): Payer: Medicare Other | Attending: Hematology

## 2018-09-12 ENCOUNTER — Other Ambulatory Visit: Payer: Self-pay

## 2018-09-12 DIAGNOSIS — Z7982 Long term (current) use of aspirin: Secondary | ICD-10-CM | POA: Diagnosis not present

## 2018-09-12 DIAGNOSIS — Z7984 Long term (current) use of oral hypoglycemic drugs: Secondary | ICD-10-CM | POA: Insufficient documentation

## 2018-09-12 DIAGNOSIS — Z85820 Personal history of malignant melanoma of skin: Secondary | ICD-10-CM | POA: Diagnosis not present

## 2018-09-12 DIAGNOSIS — Z801 Family history of malignant neoplasm of trachea, bronchus and lung: Secondary | ICD-10-CM | POA: Insufficient documentation

## 2018-09-12 DIAGNOSIS — Z803 Family history of malignant neoplasm of breast: Secondary | ICD-10-CM | POA: Diagnosis not present

## 2018-09-12 DIAGNOSIS — D509 Iron deficiency anemia, unspecified: Secondary | ICD-10-CM | POA: Insufficient documentation

## 2018-09-12 DIAGNOSIS — E119 Type 2 diabetes mellitus without complications: Secondary | ICD-10-CM | POA: Insufficient documentation

## 2018-09-12 DIAGNOSIS — D500 Iron deficiency anemia secondary to blood loss (chronic): Secondary | ICD-10-CM

## 2018-09-12 DIAGNOSIS — Z79899 Other long term (current) drug therapy: Secondary | ICD-10-CM | POA: Insufficient documentation

## 2018-09-12 LAB — CBC WITH DIFFERENTIAL/PLATELET
Abs Immature Granulocytes: 0.01 10*3/uL (ref 0.00–0.07)
Basophils Absolute: 0.1 10*3/uL (ref 0.0–0.1)
Basophils Relative: 1 %
Eosinophils Absolute: 0.2 10*3/uL (ref 0.0–0.5)
Eosinophils Relative: 4 %
HCT: 38.1 % (ref 36.0–46.0)
Hemoglobin: 12.3 g/dL (ref 12.0–15.0)
Immature Granulocytes: 0 %
Lymphocytes Relative: 32 %
Lymphs Abs: 1.9 10*3/uL (ref 0.7–4.0)
MCH: 29.4 pg (ref 26.0–34.0)
MCHC: 32.3 g/dL (ref 30.0–36.0)
MCV: 90.9 fL (ref 80.0–100.0)
Monocytes Absolute: 0.5 10*3/uL (ref 0.1–1.0)
Monocytes Relative: 9 %
Neutro Abs: 3.2 10*3/uL (ref 1.7–7.7)
Neutrophils Relative %: 54 %
Platelets: 225 10*3/uL (ref 150–400)
RBC: 4.19 MIL/uL (ref 3.87–5.11)
RDW: 12.6 % (ref 11.5–15.5)
WBC: 5.8 10*3/uL (ref 4.0–10.5)
nRBC: 0 % (ref 0.0–0.2)

## 2018-09-12 LAB — FERRITIN: Ferritin: 238 ng/mL (ref 11–307)

## 2018-09-12 LAB — IRON AND TIBC
Iron: 59 ug/dL (ref 28–170)
Saturation Ratios: 24 % (ref 10.4–31.8)
TIBC: 242 ug/dL — ABNORMAL LOW (ref 250–450)
UIBC: 183 ug/dL

## 2018-09-19 ENCOUNTER — Inpatient Hospital Stay (HOSPITAL_COMMUNITY): Payer: Medicare Other | Admitting: Hematology

## 2018-09-19 ENCOUNTER — Other Ambulatory Visit: Payer: Self-pay

## 2018-09-19 ENCOUNTER — Encounter (HOSPITAL_COMMUNITY): Payer: Self-pay | Admitting: Hematology

## 2018-09-19 VITALS — BP 142/75 | HR 83 | Temp 98.2°F | Resp 18 | Wt 204.0 lb

## 2018-09-19 DIAGNOSIS — Z85820 Personal history of malignant melanoma of skin: Secondary | ICD-10-CM

## 2018-09-19 DIAGNOSIS — Z7982 Long term (current) use of aspirin: Secondary | ICD-10-CM

## 2018-09-19 DIAGNOSIS — D509 Iron deficiency anemia, unspecified: Secondary | ICD-10-CM | POA: Diagnosis not present

## 2018-09-19 DIAGNOSIS — Z7984 Long term (current) use of oral hypoglycemic drugs: Secondary | ICD-10-CM | POA: Diagnosis not present

## 2018-09-19 DIAGNOSIS — Z79899 Other long term (current) drug therapy: Secondary | ICD-10-CM

## 2018-09-19 DIAGNOSIS — D500 Iron deficiency anemia secondary to blood loss (chronic): Secondary | ICD-10-CM

## 2018-09-19 DIAGNOSIS — E119 Type 2 diabetes mellitus without complications: Secondary | ICD-10-CM | POA: Diagnosis not present

## 2018-09-19 DIAGNOSIS — Z803 Family history of malignant neoplasm of breast: Secondary | ICD-10-CM

## 2018-09-19 DIAGNOSIS — Z801 Family history of malignant neoplasm of trachea, bronchus and lung: Secondary | ICD-10-CM

## 2018-09-19 NOTE — Patient Instructions (Addendum)
[facility] at [facility] Discharge Instructions  You were seen today by Dr. Katragadda. He went over your recent lab results. He will see you back in 3 months for labs and follow up.   Thank you for choosing [facility] at [facility] to provide your oncology and hematology care.  To afford each patient quality time with our provider, please arrive at least 15 minutes before your scheduled appointment time.   If you have a lab appointment with the Cancer Center please come in thru the  Main Entrance and check in at the main information desk  You need to re-schedule your appointment should you arrive 10 or more minutes late.  We strive to give you quality time with our providers, and arriving late affects you and other patients whose appointments are after yours.  Also, if you no show three or more times for appointments you may be dismissed from the clinic at the providers discretion.     Again, thank you for choosing [facility].  Our hope is that these requests will decrease the amount of time that you wait before being seen by our physicians.       _____________________________________________________________  Should you have questions after your visit to [facility], please contact our office at [phone] between the hours of 8:00 a.m. and 4:30 p.m.  Voicemails left after 4:00 p.m. will not be returned until the following business day.  For prescription refill requests, have your pharmacy contact our office and allow 72 hours.    Cancer Center Support Programs:   > Cancer Support Group  2nd Tuesday of the month 1pm-2pm, Journey Room    

## 2018-09-19 NOTE — Assessment & Plan Note (Signed)
1.  Iron deficiency anemia: - History of iron deficiency state from GI blood loss from ileal erosions.  EGD and colonoscopy in February 2018 showed benign gastric polyps, moderate gastritis, external and internal hemorrhoids. -She also has difficulty absorption of iron.  Previously on oral therapy with no improvement. -Denies any bleeding per rectum or melena. -Feraheme infusion on 05/22/2018 and 05/29/2018.  She reported energy improvement after the infusions. - We reviewed her blood work from 09/12/2018.  Ferritin is improved to 238 from 80.  Percent saturation was 24.  Hemoglobin improved to 12.3. -She does not require any parenteral iron therapy at this time.  We will see her back in 3 months with repeat blood work.  2.  Family history: -She has 2 sisters with breast cancer.  One sister had lung cancer. -Patient had a personal history of melanoma. - She was evaluated by our genetic counselor.  She was found to have heterozygous for MUTYH mutation.  Homozygous mutation is associated with increased risk of 4 polyps.  She will follow normal guidelines.

## 2018-09-19 NOTE — Progress Notes (Signed)
Kristina Berry, [place] [address]   CLINIC:  Medical Oncology/Hematology  PCP:  Kristina Labrum, MD [place] [id number] [phone]   REASON FOR VISIT:  Follow-up for iron deficiency anemia  CURRENT THERAPY: Iron IV PRN   INTERVAL HISTORY:  Kristina Berry 73 y.o. female seen for follow-up of iron deficiency anemia.  Denies any bleeding per rectum or melena.  She is not on oral iron therapy as it did not improve her levels.  She received Feraheme in February and felt improvement in energy levels after that.  She had 1 day of fatigue yesterday.  Denies any fevers, night sweats or weight loss.  Denies any hospitalizations or infections.  REVIEW OF SYSTEMS:  Review of Systems  Constitutional: Negative for fatigue.  HENT:  Negative.   Eyes: Negative.   Respiratory: Negative.   Cardiovascular: Negative.   Gastrointestinal: Negative.   Endocrine: Negative.   Genitourinary: Negative.    Musculoskeletal: Negative.   Skin: Negative.   Neurological: Negative.   Hematological: Negative.   Psychiatric/Behavioral: Negative.      PAST MEDICAL/SURGICAL HISTORY:  Past Medical History:  Diagnosis Date  . Anemia   . Anxiety   . Baker's cyst of knee   . Chronic low back pain   . Diastolic dysfunction   . Essential hypertension   . Family history of breast cancer   . Family history of melanoma   . Family history of stomach cancer   . Family history of uterine cancer   . GERD (gastroesophageal reflux disease)   . Hyperlipidemia   . Iron deficiency anemia 05/18/2016  . LBBB (left bundle branch block)   . Melanoma (Kristina Berry)   . Mitral regurgitation    Mild to moderate 2011  . Skin cancer    basal cell removed from face/melanoma removed from rt arm  . Type 2 diabetes mellitus ([place])    Past Surgical History:  Procedure Laterality Date  . ADENOIDECTOMY    . BACK SURGERY  05/2014  . BIOPSY  05/28/2016   Procedure: BIOPSY;  Surgeon: Kristina Binder, MD;  [facility];  Service: Endoscopy;;  duodenal and gastric  . CHOLECYSTECTOMY  2009  . COLONOSCOPY N/A 05/28/2016   Dr. Oneida Berry: one 8 mm tubular adenomas, diverticulosis in recto-sigmoid, sigmoid, and descending. Colonoscopy in 5-10 years if benefits outweigh the risks  . ESOPHAGOGASTRODUODENOSCOPY N/A 05/28/2016   Dr. Oneida Berry: multiple benign-appearing gastric polyps, gastritis  . GIVENS CAPSULE STUDY N/A 06/23/2016   Dr. Oneida Berry: mild ileitis  . MELANOMA SURGERY Right   . POLYPECTOMY  05/28/2016   Procedure: POLYPECTOMY;  Surgeon: Kristina Binder, MD;  [facility];  Service: Endoscopy;;  ascending colon   . SKIN CANCER EXCISION     Basal cell removal from left side of face  . TONSILLECTOMY       SOCIAL HISTORY:  Social History   Socioeconomic History  . Marital status: Married    Spouse name: Not on file  . Number of children: Not on file  . Years of education: Not on file  . Highest education level: Not on file  Occupational History  . Not on file  Social Needs  . Financial resource strain: Not on file  . Food insecurity:    Worry: Not on file    Inability: Not on file  . Transportation needs:    Medical: Not on file    Non-medical: Not on  file  Tobacco Use  . Smoking status: Never Smoker  . Smokeless tobacco: Never Used  Substance and Sexual Activity  . Alcohol use: No    Alcohol/week: 0.0 standard drinks  . Drug use: No  . Sexual activity: Not on file    Comment: married  Lifestyle  . Physical activity:    Days per week: Not on file    Minutes per session: Not on file  . Stress: Not on file  Relationships  . Social connections:    Talks on phone: Not on file    Gets together: Not on file    Attends religious service: Not on file    Active member of club or organization: Not on file    Attends meetings of clubs or organizations: Not on file    Relationship status: Not on file  . Intimate partner violence:    Fear of current or  ex partner: Not on file    Emotionally abused: Not on file    Physically abused: Not on file    Forced sexual activity: Not on file  Other Topics Concern  . Not on file  Social History Narrative  . Not on file    FAMILY HISTORY:  Family History  Problem Relation Age of Onset  . Osteoporosis Mother   . Dementia Mother   . Diabetes Mother   . Melanoma Mother        dx in her 32s  . Breast cancer Sister 23       triple negative  . Heart Problems Father   . Diabetes Father   . Breast cancer Sister 54  . Learning disabilities Sister   . COPD Sister   . Lung cancer Sister   . Breast cancer Maternal Aunt 48  . Stomach cancer Maternal Uncle   . Uterine cancer Other 35  . Colon cancer Neg Hx     CURRENT MEDICATIONS:  Outpatient Encounter Medications as of 09/19/2018  Medication Sig  . aspirin 81 MG tablet Take 1 tablet by mouth daily.  Marland Kitchen atorvastatin (LIPITOR) 20 MG tablet Take 1 tablet by mouth daily.  . Cholecalciferol (VITAMIN D3) 2000 units TABS Take 1 tablet by mouth daily.  Marland Kitchen diltiazem (CARDIZEM CD) 240 MG 24 hr capsule TAKE 1 CAPSULE BY MOUTH  DAILY  . furosemide (LASIX) 40 MG tablet Take 40 mg by mouth 2 (two) times daily.   . insulin glargine (LANTUS) 100 UNIT/ML injection Inject 20 Units into the skin at bedtime.  . irbesartan (AVAPRO) 150 MG tablet Take 1 tablet by mouth daily.  Marland Kitchen ketoconazole (NIZORAL) 2 % cream as needed.   . loratadine (CLARITIN) 10 MG tablet Take 10 mg by mouth daily.  . metFORMIN (GLUCOPHAGE) 1000 MG tablet Take 1 tablet by mouth 2 (two) times daily.  Marland Kitchen omeprazole (PRILOSEC) 20 MG capsule Take 1 capsule by mouth daily.  Marland Kitchen PARoxetine (PAXIL) 10 MG tablet Take 0.5 tablets by mouth daily.  Marland Kitchen PROAIR HFA 108 (90 Base) MCG/ACT inhaler INHALE 1 TO 2 PUFFS BY MOUTH EVERY 4 TO 6 HOURS AS NEEDED FOR SHORTNESS OF BREATH OR WHEEZING  . sitaGLIPtin (JANUVIA) 100 MG tablet Take 1 tablet by mouth daily.  . [DISCONTINUED] ferrous sulfate 325 (65 FE) MG tablet  Take 325 mg by mouth daily with breakfast.   No facility-administered encounter medications on file as of 09/19/2018.     ALLERGIES:  Allergies  Allergen Reactions  . Cefdinir Other (See Comments)    Stomach cramps  .  Nsaids Other (See Comments)    Stomach pain  . Sulfa Antibiotics Nausea Only     PHYSICAL EXAM:  ECOG Performance status: 0  Blood pressure is 142/75.  Pulse rate is 83.  Respirate is 18.  Temperature 98.2.  Saturations 97%. Physical Exam Constitutional:      Appearance: Normal appearance.  Cardiovascular:     Rate and Rhythm: Normal rate and regular rhythm.     Heart sounds: Normal heart sounds.  Pulmonary:     Effort: Pulmonary effort is normal.     Breath sounds: Normal breath sounds.  Abdominal:     General: There is no distension.     Palpations: Abdomen is soft. There is no mass.  Skin:    General: Skin is warm and dry.  Neurological:     Mental Status: She is alert and oriented to person, place, and time.  Psychiatric:        Mood and Affect: Mood normal.        Behavior: Behavior normal.     LABORATORY DATA:  I have reviewed the labs as listed.  CBC    Component Value Date/Time   WBC 5.8 09/12/2018 1254   RBC 4.19 09/12/2018 1254   HGB 12.3 09/12/2018 1254   HCT 38.1 09/12/2018 1254   PLT 225 09/12/2018 1254   MCV 90.9 09/12/2018 1254   MCH 29.4 09/12/2018 1254   MCHC 32.3 09/12/2018 1254   RDW 12.6 09/12/2018 1254   LYMPHSABS 1.9 09/12/2018 1254   MONOABS 0.5 09/12/2018 1254   EOSABS 0.2 09/12/2018 1254   BASOSABS 0.1 09/12/2018 1254   CMP Latest Ref Rng & Units 05/09/2018 11/01/2017 05/04/2017  Glucose 70 - 99 mg/dL 156(H) 122(H) 154(H)  BUN 8 - 23 mg/dL 18 13 16   Creatinine 0.44 - 1.00 mg/dL 0.59 0.48 0.54  Sodium 135 - 145 mmol/L 142 142 139  Potassium 3.5 - 5.1 mmol/L 4.9 4.5 4.4  Chloride 98 - 111 mmol/L 104 102 98(L)  CO2 22 - 32 mmol/L 29 33(H) 29  Calcium 8.9 - 10.3 mg/dL 9.0 9.2 9.3  Total Protein 6.5 - 8.1 g/dL 6.4(L)  6.4(L) 7.0  Total Bilirubin 0.3 - 1.2 mg/dL 0.7 0.8 0.9  Alkaline Phos 38 - 126 U/L 61 70 89  AST 15 - 41 U/L 19 14(L) 20  ALT 0 - 44 U/L 16 13 14           ASSESSMENT & PLAN:   Iron deficiency anemia 1.  Iron deficiency anemia: - History of iron deficiency state from GI blood loss from ileal erosions.  EGD and colonoscopy in February 2018 showed benign gastric polyps, moderate gastritis, external and internal hemorrhoids. -She also has difficulty absorption of iron.  Previously on oral therapy with no improvement. -Denies any bleeding per rectum or melena. -Feraheme infusion on 05/22/2018 and 05/29/2018.  She reported energy improvement after the infusions. - We reviewed her blood work from 09/12/2018.  Ferritin is improved to 238 from 80.  Percent saturation was 24.  Hemoglobin improved to 12.3. -She does not require any parenteral iron therapy at this time.  We will see her back in 3 months with repeat blood work.  2.  Family history: -She has 2 sisters with breast cancer.  One sister had lung cancer. -Patient had a personal history of melanoma. - She was evaluated by our genetic counselor.  She was found to have heterozygous for MUTYH mutation.  Homozygous mutation is associated with increased risk of 4 polyps.  She will follow normal guidelines.      Orders placed this encounter:  Orders Placed This Encounter  Procedures  . CBC with Differential/Platelet  . Comprehensive metabolic panel  . Iron and TIBC  . Ferritin      Derek Jack, MD [place] [phone]

## 2018-11-10 DIAGNOSIS — I10 Essential (primary) hypertension: Secondary | ICD-10-CM | POA: Diagnosis not present

## 2018-11-10 DIAGNOSIS — I5032 Chronic diastolic (congestive) heart failure: Secondary | ICD-10-CM | POA: Diagnosis not present

## 2018-11-10 DIAGNOSIS — E1165 Type 2 diabetes mellitus with hyperglycemia: Secondary | ICD-10-CM | POA: Diagnosis not present

## 2018-11-16 ENCOUNTER — Other Ambulatory Visit: Payer: Self-pay

## 2018-11-16 DIAGNOSIS — Z20822 Contact with and (suspected) exposure to covid-19: Secondary | ICD-10-CM

## 2018-11-17 LAB — NOVEL CORONAVIRUS, NAA: SARS-CoV-2, NAA: NOT DETECTED

## 2018-12-04 ENCOUNTER — Encounter (INDEPENDENT_AMBULATORY_CARE_PROVIDER_SITE_OTHER): Payer: Medicare Other | Admitting: Ophthalmology

## 2018-12-04 ENCOUNTER — Other Ambulatory Visit: Payer: Self-pay

## 2018-12-04 DIAGNOSIS — D3132 Benign neoplasm of left choroid: Secondary | ICD-10-CM | POA: Diagnosis not present

## 2018-12-04 DIAGNOSIS — H35033 Hypertensive retinopathy, bilateral: Secondary | ICD-10-CM

## 2018-12-04 DIAGNOSIS — H353131 Nonexudative age-related macular degeneration, bilateral, early dry stage: Secondary | ICD-10-CM

## 2018-12-04 DIAGNOSIS — H43813 Vitreous degeneration, bilateral: Secondary | ICD-10-CM | POA: Diagnosis not present

## 2018-12-04 DIAGNOSIS — I10 Essential (primary) hypertension: Secondary | ICD-10-CM | POA: Diagnosis not present

## 2018-12-11 DIAGNOSIS — I10 Essential (primary) hypertension: Secondary | ICD-10-CM | POA: Diagnosis not present

## 2018-12-11 DIAGNOSIS — E1165 Type 2 diabetes mellitus with hyperglycemia: Secondary | ICD-10-CM | POA: Diagnosis not present

## 2018-12-20 ENCOUNTER — Inpatient Hospital Stay (HOSPITAL_COMMUNITY): Payer: Medicare Other | Attending: Hematology

## 2018-12-20 ENCOUNTER — Other Ambulatory Visit: Payer: Self-pay

## 2018-12-20 DIAGNOSIS — Z794 Long term (current) use of insulin: Secondary | ICD-10-CM | POA: Insufficient documentation

## 2018-12-20 DIAGNOSIS — I10 Essential (primary) hypertension: Secondary | ICD-10-CM | POA: Insufficient documentation

## 2018-12-20 DIAGNOSIS — Z85820 Personal history of malignant melanoma of skin: Secondary | ICD-10-CM | POA: Insufficient documentation

## 2018-12-20 DIAGNOSIS — Z8049 Family history of malignant neoplasm of other genital organs: Secondary | ICD-10-CM | POA: Diagnosis not present

## 2018-12-20 DIAGNOSIS — Z7982 Long term (current) use of aspirin: Secondary | ICD-10-CM | POA: Diagnosis not present

## 2018-12-20 DIAGNOSIS — D500 Iron deficiency anemia secondary to blood loss (chronic): Secondary | ICD-10-CM

## 2018-12-20 DIAGNOSIS — E119 Type 2 diabetes mellitus without complications: Secondary | ICD-10-CM | POA: Diagnosis not present

## 2018-12-20 DIAGNOSIS — Z808 Family history of malignant neoplasm of other organs or systems: Secondary | ICD-10-CM | POA: Diagnosis not present

## 2018-12-20 DIAGNOSIS — Z79899 Other long term (current) drug therapy: Secondary | ICD-10-CM | POA: Insufficient documentation

## 2018-12-20 DIAGNOSIS — F419 Anxiety disorder, unspecified: Secondary | ICD-10-CM | POA: Diagnosis not present

## 2018-12-20 DIAGNOSIS — Z803 Family history of malignant neoplasm of breast: Secondary | ICD-10-CM | POA: Diagnosis not present

## 2018-12-20 DIAGNOSIS — Z800 Family history of malignant neoplasm of digestive organs: Secondary | ICD-10-CM | POA: Diagnosis not present

## 2018-12-20 DIAGNOSIS — Z801 Family history of malignant neoplasm of trachea, bronchus and lung: Secondary | ICD-10-CM | POA: Diagnosis not present

## 2018-12-20 DIAGNOSIS — D509 Iron deficiency anemia, unspecified: Secondary | ICD-10-CM | POA: Insufficient documentation

## 2018-12-20 LAB — COMPREHENSIVE METABOLIC PANEL
ALT: 16 U/L (ref 0–44)
AST: 19 U/L (ref 15–41)
Albumin: 3.7 g/dL (ref 3.5–5.0)
Alkaline Phosphatase: 78 U/L (ref 38–126)
Anion gap: 7 (ref 5–15)
BUN: 13 mg/dL (ref 8–23)
CO2: 31 mmol/L (ref 22–32)
Calcium: 9.1 mg/dL (ref 8.9–10.3)
Chloride: 100 mmol/L (ref 98–111)
Creatinine, Ser: 0.58 mg/dL (ref 0.44–1.00)
GFR calc Af Amer: >60 mL/min (ref >60)
GFR calc non Af Amer: >60 mL/min (ref >60)
Glucose, Bld: 198 mg/dL — ABNORMAL HIGH (ref 70–99)
Potassium: 4.6 mmol/L (ref 3.5–5.1)
Sodium: 138 mmol/L (ref 135–145)
Total Bilirubin: 0.8 mg/dL (ref 0.3–1.2)
Total Protein: 6.4 g/dL — ABNORMAL LOW (ref 6.5–8.1)

## 2018-12-20 LAB — CBC WITH DIFFERENTIAL/PLATELET
Abs Immature Granulocytes: 0.02 10*3/uL (ref 0.00–0.07)
Basophils Absolute: 0.1 10*3/uL (ref 0.0–0.1)
Basophils Relative: 1 %
Eosinophils Absolute: 0.2 10*3/uL (ref 0.0–0.5)
Eosinophils Relative: 3 %
HCT: 37.7 % (ref 36.0–46.0)
Hemoglobin: 12.2 g/dL (ref 12.0–15.0)
Immature Granulocytes: 0 %
Lymphocytes Relative: 30 %
Lymphs Abs: 2.2 10*3/uL (ref 0.7–4.0)
MCH: 29.8 pg (ref 26.0–34.0)
MCHC: 32.4 g/dL (ref 30.0–36.0)
MCV: 92 fL (ref 80.0–100.0)
Monocytes Absolute: 0.5 10*3/uL (ref 0.1–1.0)
Monocytes Relative: 7 %
Neutro Abs: 4.4 10*3/uL (ref 1.7–7.7)
Neutrophils Relative %: 59 %
Platelets: 234 10*3/uL (ref 150–400)
RBC: 4.1 MIL/uL (ref 3.87–5.11)
RDW: 12.2 % (ref 11.5–15.5)
WBC: 7.4 10*3/uL (ref 4.0–10.5)
nRBC: 0 % (ref 0.0–0.2)

## 2018-12-20 LAB — FERRITIN: Ferritin: 222 ng/mL (ref 11–307)

## 2018-12-20 LAB — IRON AND TIBC
Iron: 65 ug/dL (ref 28–170)
Saturation Ratios: 25 % (ref 10.4–31.8)
TIBC: 264 ug/dL (ref 250–450)
UIBC: 199 ug/dL

## 2018-12-21 ENCOUNTER — Inpatient Hospital Stay (HOSPITAL_COMMUNITY): Payer: Medicare Other | Admitting: Hematology

## 2018-12-21 ENCOUNTER — Other Ambulatory Visit: Payer: Self-pay

## 2018-12-21 DIAGNOSIS — I10 Essential (primary) hypertension: Secondary | ICD-10-CM | POA: Diagnosis not present

## 2018-12-21 DIAGNOSIS — Z801 Family history of malignant neoplasm of trachea, bronchus and lung: Secondary | ICD-10-CM | POA: Diagnosis not present

## 2018-12-21 DIAGNOSIS — E119 Type 2 diabetes mellitus without complications: Secondary | ICD-10-CM | POA: Diagnosis not present

## 2018-12-21 DIAGNOSIS — Z85820 Personal history of malignant melanoma of skin: Secondary | ICD-10-CM | POA: Diagnosis not present

## 2018-12-21 DIAGNOSIS — Z808 Family history of malignant neoplasm of other organs or systems: Secondary | ICD-10-CM | POA: Diagnosis not present

## 2018-12-21 DIAGNOSIS — Z803 Family history of malignant neoplasm of breast: Secondary | ICD-10-CM | POA: Diagnosis not present

## 2018-12-21 DIAGNOSIS — D509 Iron deficiency anemia, unspecified: Secondary | ICD-10-CM | POA: Diagnosis not present

## 2018-12-21 DIAGNOSIS — Z79899 Other long term (current) drug therapy: Secondary | ICD-10-CM | POA: Diagnosis not present

## 2018-12-21 DIAGNOSIS — Z794 Long term (current) use of insulin: Secondary | ICD-10-CM | POA: Diagnosis not present

## 2018-12-21 DIAGNOSIS — Z7982 Long term (current) use of aspirin: Secondary | ICD-10-CM | POA: Diagnosis not present

## 2018-12-21 DIAGNOSIS — D508 Other iron deficiency anemias: Secondary | ICD-10-CM | POA: Diagnosis not present

## 2018-12-21 DIAGNOSIS — Z800 Family history of malignant neoplasm of digestive organs: Secondary | ICD-10-CM | POA: Diagnosis not present

## 2018-12-21 NOTE — Progress Notes (Signed)
Kristina Berry, [place] [address]   CLINIC:  Medical Oncology/Hematology  PCP:  Curlene Labrum, MD [place] [id number] [phone]   REASON FOR VISIT:  Follow-up for IDA  CURRENT THERAPY: Clinical surveillance  BRIEF ONCOLOGIC HISTORY:  Oncology History  Melanoma [facility])   Initial Diagnosis   Melanoma ([place])   03/02/2018 Genetic Testing   MUTYH c.1187G>A heterozygous pathogenic variant found on the multicancer panel.  The Multi-Gene Panel offered by [facility] includes sequencing and/or deletion duplication testing of the following 84 genes: AIP, ALK, APC, ATM, AXIN2,BAP1,  BARD1, BLM, BMPR1A, BRCA1, BRCA2, BRIP1, CASR, CDC73, CDH1, CDK4, CDKN1B, CDKN1C, CDKN2A (p14ARF), CDKN2A (p16INK4a), CEBPA, CHEK2, CTNNA1, DICER1, DIS3L2, EGFR (c.2369C>T, p.Thr790Met variant only), EPCAM (Deletion/duplication testing only), FH, FLCN, GATA2, GPC3, GREM1 (Promoter region deletion/duplication testing only), HOXB13 (c.251G>A, p.Gly84Glu), HRAS, KIT, MAX, MEN1, MET, MITF (c.952G>A, p.Glu318Lys variant only), MLH1, MSH2, MSH3, MSH6, MUTYH, NBN, NF1, NF2, NTHL1, PALB2, PDGFRA, PHOX2B, PMS2, POLD1, POLE, POT1, PRKAR1A, PTCH1, PTEN, RAD50, RAD51C, RAD51D, RB1, RECQL4, RET, RUNX1, SDHAF2, SDHA (sequence changes only), SDHB, SDHC, SDHD, SMAD4, SMARCA4, SMARCB1, SMARCE1, STK11, SUFU, TERC, TERT, TMEM127, TP53, TSC1, TSC2, VHL, WRN and WT1.  The report date is 03/02/2018.        INTERVAL HISTORY:  Kristina Berry 73 y.o. female presents today for follow-up.  She reports overall doing well she reports mild fatigue.  States her fatigue level is appropriate for her activities.  She denies any obvious signs of bleeding.  No chest pain, shortness of breath, lightheadedness or dizziness.  Denies any changes in her bowel habits.  No weight loss.  Appetite is stable.  She denies any recent hospitalizations or infections.  She is here for repeat labs and office visit.   REVIEW  OF SYSTEMS:  Review of Systems  Constitutional: Positive for fatigue.  HENT:  Negative.   Eyes: Negative.   Respiratory: Negative.   Cardiovascular: Negative.   Gastrointestinal: Negative.   Endocrine: Negative.   Genitourinary: Negative.    Musculoskeletal: Positive for arthralgias and myalgias.  Skin: Negative.   Neurological: Negative.   Hematological: Negative.   Psychiatric/Behavioral: Negative.      PAST MEDICAL/SURGICAL HISTORY:  Past Medical History:  Diagnosis Date  . Anemia   . Anxiety   . Baker's cyst of knee   . Chronic low back pain   . Diastolic dysfunction   . Essential hypertension   . Family history of breast cancer   . Family history of melanoma   . Family history of stomach cancer   . Family history of uterine cancer   . GERD (gastroesophageal reflux disease)   . Hyperlipidemia   . Iron deficiency anemia 05/18/2016  . LBBB (left bundle branch block)   . Melanoma ([place])   . Mitral regurgitation    Mild to moderate 2011  . Skin cancer    basal cell removed from face/melanoma removed from rt arm  . Type 2 diabetes mellitus ([place])    Past Surgical History:  Procedure Laterality Date  . ADENOIDECTOMY    . BACK SURGERY  05/2014  . BIOPSY  05/28/2016   Procedure: BIOPSY;  Surgeon: Kristina Binder, MD;  [facility];  Service: Endoscopy;;  duodenal and gastric  . CHOLECYSTECTOMY  2009  . COLONOSCOPY N/A 05/28/2016   Dr. Oneida Alar: one 8 mm tubular adenomas, diverticulosis in recto-sigmoid, sigmoid, and descending. Colonoscopy in 5-10 years if benefits outweigh the risks  . ESOPHAGOGASTRODUODENOSCOPY  N/A 05/28/2016   Dr. Oneida Alar: multiple benign-appearing gastric polyps, gastritis  . GIVENS CAPSULE STUDY N/A 06/23/2016   Dr. Oneida Alar: mild ileitis  . MELANOMA SURGERY Right   . POLYPECTOMY  05/28/2016   Procedure: POLYPECTOMY;  Surgeon: Kristina Binder, MD;  [facility];  Service: Endoscopy;;  ascending colon   . SKIN CANCER EXCISION     Basal  cell removal from left side of face  . TONSILLECTOMY       SOCIAL HISTORY:  Social History   Socioeconomic History  . Marital status: Married    Spouse name: Not on file  . Number of children: Not on file  . Years of education: Not on file  . Highest education level: Not on file  Occupational History  . Not on file  Social Needs  . Financial resource strain: Not on file  . Food insecurity    Worry: Not on file    Inability: Not on file  . Transportation needs    Medical: Not on file    Non-medical: Not on file  Tobacco Use  . Smoking status: Never Smoker  . Smokeless tobacco: Never Used  Substance and Sexual Activity  . Alcohol use: No    Alcohol/week: 0.0 standard drinks  . Drug use: No  . Sexual activity: Not on file    Comment: married  Lifestyle  . Physical activity    Days per week: Not on file    Minutes per session: Not on file  . Stress: Not on file  Relationships  . Social Herbalist on phone: Not on file    Gets together: Not on file    Attends religious service: Not on file    Active member of club or organization: Not on file    Attends meetings of clubs or organizations: Not on file    Relationship status: Not on file  . Intimate partner violence    Fear of current or ex partner: Not on file    Emotionally abused: Not on file    Physically abused: Not on file    Forced sexual activity: Not on file  Other Topics Concern  . Not on file  Social History Narrative  . Not on file    FAMILY HISTORY:  Family History  Problem Relation Age of Onset  . Osteoporosis Mother   . Dementia Mother   . Diabetes Mother   . Melanoma Mother        dx in her 73s  . Breast cancer Sister 17       triple negative  . Heart Problems Father   . Diabetes Father   . Breast cancer Sister 7  . Learning disabilities Sister   . COPD Sister   . Lung cancer Sister   . Breast cancer Maternal Aunt 48  . Stomach cancer Maternal Uncle   . Uterine cancer  Other 35  . Colon cancer Neg Hx     CURRENT MEDICATIONS:  Outpatient Encounter Medications as of 12/21/2018  Medication Sig  . aspirin 81 MG tablet Take 1 tablet by mouth daily.  Marland Kitchen atorvastatin (LIPITOR) 20 MG tablet Take 1 tablet by mouth daily.  . Cholecalciferol (VITAMIN D3) 2000 units TABS Take 1 tablet by mouth daily.  Marland Kitchen diltiazem (CARDIZEM CD) 240 MG 24 hr capsule TAKE 1 CAPSULE BY MOUTH  DAILY  . furosemide (LASIX) 40 MG tablet Take 40 mg by mouth 2 (two) times daily.   . insulin glargine (  LANTUS) 100 UNIT/ML injection Inject 20 Units into the skin at bedtime.  . irbesartan (AVAPRO) 150 MG tablet Take 1 tablet by mouth daily.  Marland Kitchen loratadine (CLARITIN) 10 MG tablet Take 10 mg by mouth daily.  . metFORMIN (GLUCOPHAGE) 1000 MG tablet Take 1 tablet by mouth 2 (two) times daily.  Marland Kitchen omeprazole (PRILOSEC) 20 MG capsule Take 1 capsule by mouth daily.  Marland Kitchen PARoxetine (PAXIL) 10 MG tablet Take 0.5 tablets by mouth daily.  . sitaGLIPtin (JANUVIA) 100 MG tablet Take 1 tablet by mouth daily.  . fluticasone (FLONASE) 50 MCG/ACT nasal spray Place 2 sprays into both nostrils as needed for allergies or rhinitis.  Marland Kitchen ketoconazole (NIZORAL) 2 % cream as needed.   Marland Kitchen PROAIR HFA 108 (90 Base) MCG/ACT inhaler INHALE 1 TO 2 PUFFS BY MOUTH EVERY 4 TO 6 HOURS AS NEEDED FOR SHORTNESS OF BREATH OR WHEEZING   No facility-administered encounter medications on file as of 12/21/2018.     ALLERGIES:  Allergies  Allergen Reactions  . Cefdinir Other (See Comments)    Stomach cramps  . Nsaids Other (See Comments)    Stomach pain  . Sulfa Antibiotics Nausea Only     PHYSICAL EXAM:  ECOG Performance status: 1  Vitals:   12/21/18 1226  BP: 112/84  Pulse: 78  Resp: 16  Temp: (!) 97.5 F (36.4 C)  SpO2: 96%   Filed Weights   12/21/18 1226  Weight: 205 lb 11.2 oz (93.3 kg)    Physical Exam Constitutional:      Appearance: Normal appearance.  HENT:     Head: Normocephalic.     Right Ear: External  ear normal.     Left Ear: External ear normal.     Nose: Nose normal.     Mouth/Throat:     Pharynx: Oropharynx is clear.  Eyes:     Extraocular Movements: Extraocular movements intact.     Conjunctiva/sclera: Conjunctivae normal.  Neck:     Musculoskeletal: Normal range of motion.  Cardiovascular:     Rate and Rhythm: Normal rate and regular rhythm.     Pulses: Normal pulses.     Heart sounds: Normal heart sounds.  Pulmonary:     Effort: Pulmonary effort is normal.     Breath sounds: Normal breath sounds.  Abdominal:     General: Bowel sounds are normal.  Musculoskeletal: Normal range of motion.  Skin:    General: Skin is warm.  Neurological:     General: No focal deficit present.     Mental Status: She is alert and oriented to person, place, and time.  Psychiatric:        Mood and Affect: Mood normal.        Behavior: Behavior normal.        Thought Content: Thought content normal.        Judgment: Judgment normal.      LABORATORY DATA:  I have reviewed the labs as listed.  CBC    Component Value Date/Time   WBC 7.4 12/20/2018 1112   RBC 4.10 12/20/2018 1112   HGB 12.2 12/20/2018 1112   HCT 37.7 12/20/2018 1112   PLT 234 12/20/2018 1112   MCV 92.0 12/20/2018 1112   MCH 29.8 12/20/2018 1112   MCHC 32.4 12/20/2018 1112   RDW 12.2 12/20/2018 1112   LYMPHSABS 2.2 12/20/2018 1112   MONOABS 0.5 12/20/2018 1112   EOSABS 0.2 12/20/2018 1112   BASOSABS 0.1 12/20/2018 1112   CMP Latest Ref Rng &  Units 12/20/2018 05/09/2018 11/01/2017  Glucose 70 - 99 mg/dL 198(H) 156(H) 122(H)  BUN 8 - 23 mg/dL _0 Creatinine 0.44 - 1.00 mg/dL 0.58 0.59 0.48  Sodium 135 - 145 mmol/L 138 142 142  Potassium 3.5 - 5.1 mmol/L 4.6 4.9 4.5  Chloride 98 - 111 mmol/L 100 104 102  CO2 22 - 32 mmol/L 31 29 33(H)  Calcium 8.9 - 10.3 mg/dL 9.1 9.0 9.2  Total Protein 6.5 - 8.1 g/dL 6.4(L) 6.4(L) 6.4(L)  Total Bilirubin 0.3 - 1.2 mg/dL 0.8 0.7 0.8  Alkaline Phos 38 - 126 U/L 78 61 70  AST  15 - 41 U/L 19 19 14(L)  ALT 0 - 44 U/L _1 ASSESSMENT & PLAN:   Iron deficiency anemia 1.  Iron deficiency anemia: - History of iron deficiency state from GI blood loss from ileal erosions.  EGD and colonoscopy in February 2018 showed benign gastric polyps, moderate gastritis, external and internal hemorrhoids. -She also has difficulty absorption of iron.  Previously on oral therapy with no improvement. -Denies any bleeding per rectum or melena. -Feraheme infusion on 05/22/2018 and 05/29/2018.  She reported energy improvement after the infusions. -Labs from 12/20/2018 revealed stable hemoglobin at 12.2, ferritin of 222, iron 65, TIBC 264 saturation ratio of 25%. -She does not require any parenteral iron therapy at this time.  We will see her back in 6 months with repeat blood work or sooner if needed.  2.  Family history: -She has 2 sisters with breast cancer.  One sister had lung cancer. -Patient had a personal history of melanoma. - She was evaluated by our genetic counselor.  She was found to have heterozygous for MUTYH mutation.  Homozygous mutation is associated with increased risk of 4 polyps.  She will follow normal guidelines.      Orders placed this encounter:  Orders Placed This Encounter  Procedures  . CBC with Differential  . Comprehensive metabolic panel  . Iron and TIBC  . [place], [place] [phone]

## 2018-12-21 NOTE — Assessment & Plan Note (Addendum)
1.  Iron deficiency anemia: - History of iron deficiency state from GI blood loss from ileal erosions.  EGD and colonoscopy in February 2018 showed benign gastric polyps, moderate gastritis, external and internal hemorrhoids. -She also has difficulty absorption of iron.  Previously on oral therapy with no improvement. -Denies any bleeding per rectum or melena. -Feraheme infusion on 05/22/2018 and 05/29/2018.  She reported energy improvement after the infusions. -Labs from 12/20/2018 revealed stable hemoglobin at 12.2, ferritin of 222, iron 65, TIBC 264 saturation ratio of 25%. -She does not require any parenteral iron therapy at this time.  We will see her back in 6 months with repeat blood work or sooner if needed.  2.  Family history: -She has 2 sisters with breast cancer.  One sister had lung cancer. -Patient had a personal history of melanoma. - She was evaluated by our genetic counselor.  She was found to have heterozygous for MUTYH mutation.  Homozygous mutation is associated with increased risk of 4 polyps.  She will follow normal guidelines.

## 2019-01-10 DIAGNOSIS — E1165 Type 2 diabetes mellitus with hyperglycemia: Secondary | ICD-10-CM | POA: Diagnosis not present

## 2019-01-10 DIAGNOSIS — I10 Essential (primary) hypertension: Secondary | ICD-10-CM | POA: Diagnosis not present

## 2019-01-15 ENCOUNTER — Other Ambulatory Visit: Payer: Self-pay | Admitting: Cardiology

## 2019-01-18 DIAGNOSIS — L570 Actinic keratosis: Secondary | ICD-10-CM | POA: Diagnosis not present

## 2019-01-18 DIAGNOSIS — Z08 Encounter for follow-up examination after completed treatment for malignant neoplasm: Secondary | ICD-10-CM | POA: Diagnosis not present

## 2019-01-18 DIAGNOSIS — Z1283 Encounter for screening for malignant neoplasm of skin: Secondary | ICD-10-CM | POA: Diagnosis not present

## 2019-01-18 DIAGNOSIS — Z85820 Personal history of malignant melanoma of skin: Secondary | ICD-10-CM | POA: Diagnosis not present

## 2019-01-18 DIAGNOSIS — D225 Melanocytic nevi of trunk: Secondary | ICD-10-CM | POA: Diagnosis not present

## 2019-01-19 DIAGNOSIS — I10 Essential (primary) hypertension: Secondary | ICD-10-CM | POA: Diagnosis not present

## 2019-01-19 DIAGNOSIS — K219 Gastro-esophageal reflux disease without esophagitis: Secondary | ICD-10-CM | POA: Diagnosis not present

## 2019-01-19 DIAGNOSIS — E1165 Type 2 diabetes mellitus with hyperglycemia: Secondary | ICD-10-CM | POA: Diagnosis not present

## 2019-01-19 DIAGNOSIS — E782 Mixed hyperlipidemia: Secondary | ICD-10-CM | POA: Diagnosis not present

## 2019-01-24 DIAGNOSIS — I471 Supraventricular tachycardia: Secondary | ICD-10-CM | POA: Diagnosis not present

## 2019-01-24 DIAGNOSIS — I447 Left bundle-branch block, unspecified: Secondary | ICD-10-CM | POA: Diagnosis not present

## 2019-01-24 DIAGNOSIS — Z23 Encounter for immunization: Secondary | ICD-10-CM | POA: Diagnosis not present

## 2019-01-24 DIAGNOSIS — D500 Iron deficiency anemia secondary to blood loss (chronic): Secondary | ICD-10-CM | POA: Diagnosis not present

## 2019-01-24 DIAGNOSIS — E1165 Type 2 diabetes mellitus with hyperglycemia: Secondary | ICD-10-CM | POA: Diagnosis not present

## 2019-01-24 DIAGNOSIS — Z0001 Encounter for general adult medical examination with abnormal findings: Secondary | ICD-10-CM | POA: Diagnosis not present

## 2019-02-01 ENCOUNTER — Telehealth: Payer: Self-pay | Admitting: Cardiology

## 2019-02-01 NOTE — Telephone Encounter (Signed)
Virtual Visit Pre-Appointment Phone Call  "(Name), I am calling you today to discuss your upcoming appointment. We are currently trying to limit exposure to the virus that causes COVID-19 by seeing patients at home rather than in the office."  1. "What is the BEST phone number to call the day of the visit?" - include this in appointment notes  2. Do you have or have access to (through a family member/friend) a smartphone with video capability that we can use for your visit?" a. If yes - list this number in appt notes as cell (if different from BEST phone #) and list the appointment type as a VIDEO visit in appointment notes b. If no - list the appointment type as a PHONE visit in appointment notes  3. Confirm consent - "In the setting of the current Covid19 crisis, you are scheduled for a (phone or video) visit with your provider on (date) at (time).  Just as we do with many in-office visits, in order for you to participate in this visit, we must obtain consent.  If you'd like, I can send this to your mychart (if signed up) or email for you to review.  Otherwise, I can obtain your verbal consent now.  All virtual visits are billed to your insurance company just like a normal visit would be.  By agreeing to a virtual visit, we'd like you to understand that the technology does not allow for your provider to perform an examination, and thus may limit your provider's ability to fully assess your condition. If your provider identifies any concerns that need to be evaluated in person, we will make arrangements to do so.  Finally, though the technology is pretty good, we cannot assure that it will always work on either your or our end, and in the setting of a video visit, we may have to convert it to a phone-only visit.  In either situation, we cannot ensure that we have a secure connection.  Are you willing to proceed?" STAFF: Did the patient verbally acknowledge consent to telehealth visit? Document  YES/NO here: yes  4. Advise patient to be prepared - "Two hours prior to your appointment, go ahead and check your blood pressure, pulse, oxygen saturation, and your weight (if you have the equipment to check those) and write them all down. When your visit starts, your provider will ask you for this information. If you have an Apple Watch or Kardia device, please plan to have heart rate information ready on the day of your appointment. Please have a pen and paper handy nearby the day of the visit as well."  5. Give patient instructions for MyChart download to smartphone OR Doximity/Doxy.me as below if video visit (depending on what platform provider is using)  6. Inform patient they will receive a phone call 15 minutes prior to their appointment time (may be from unknown caller ID) so they should be prepared to answer    TELEPHONE CALL NOTE  Kristina Berry has been deemed a candidate for a follow-up tele-health visit to limit community exposure during the Covid-19 pandemic. I spoke with the patient via phone to ensure availability of phone/video source, confirm preferred email & phone number, and discuss instructions and expectations.  I reminded Kristina Berry to be prepared with any vital sign and/or heart rhythm information that could potentially be obtained via home monitoring, at the time of her visit. I reminded Kristina Berry to expect a phone call prior to  her visit.  Kristina Berry 02/01/2019 1:25 PM   INSTRUCTIONS FOR DOWNLOADING THE MYCHART APP TO SMARTPHONE  - The patient must first make sure to have activated MyChart and know their login information - If Apple, go to [employer] and type in MyChart in the search bar and download the app. If Android, ask patient to go to [employer] and type in [place] in the search bar and download the app. The app is free but as with any other app downloads, their phone may require them to verify saved payment information or  Apple/Android password.  - The patient will need to then log into the app with their MyChart username and password, and select [facility] as their healthcare provider to link the account. When it is time for your visit, go to the MyChart app, find appointments, and click Begin Video Visit. Be sure to Select Allow for your device to access the Microphone and Camera for your visit. You will then be connected, and your provider will be with you shortly.  **If they have any issues connecting, or need assistance please contact MyChart service desk [phone])**  **If using a computer, in order to ensure the best quality for their visit they will need to use either of the following Internet Browsers: [employer], or Google Chrome**  IF USING DOXIMITY or DOXY.ME - The patient will receive a link just prior to their visit by text.     FULL LENGTH CONSENT FOR TELE-HEALTH VISIT   I hereby voluntarily request, consent and authorize [facility] and its employed or contracted physicians, physician assistants, nurse practitioners or other licensed health care professionals (the Practitioner), to provide me with telemedicine health care services (the Services") as deemed necessary by the treating Practitioner. I acknowledge and consent to receive the Services by the Practitioner via telemedicine. I understand that the telemedicine visit will involve communicating with the Practitioner through live audiovisual communication technology and the disclosure of certain medical information by electronic transmission. I acknowledge that I have been given the opportunity to request an in-person assessment or other available alternative prior to the telemedicine visit and am voluntarily participating in the telemedicine visit.  I understand that I have the right to withhold or withdraw my consent to the use of telemedicine in the course of my care at any time, without affecting my right to future care  or treatment, and that the Practitioner or I may terminate the telemedicine visit at any time. I understand that I have the right to inspect all information obtained and/or recorded in the course of the telemedicine visit and may receive copies of available information for a reasonable fee.  I understand that some of the potential risks of receiving the Services via telemedicine include:   Delay or interruption in medical evaluation due to technological equipment failure or disruption;  Information transmitted may not be sufficient (e.g. poor resolution of images) to allow for appropriate medical decision making by the Practitioner; and/or   In rare instances, security protocols could fail, causing a breach of personal health information.  Furthermore, I acknowledge that it is my responsibility to provide information about my medical history, conditions and care that is complete and accurate to the best of my ability. I acknowledge that Practitioner's advice, recommendations, and/or decision may be based on factors not within their control, such as incomplete or inaccurate data provided by me or distortions of diagnostic images or specimens that may result from electronic transmissions. I  understand that the practice of medicine is not an exact science and that Practitioner makes no warranties or guarantees regarding treatment outcomes. I acknowledge that I will receive a copy of this consent concurrently upon execution via email to the email address I last provided but may also request a printed copy by calling the office of [facility].    I understand that my insurance will be billed for this visit.   I have read or had this consent read to me.  I understand the contents of this consent, which adequately explains the benefits and risks of the Services being provided via telemedicine.   I have been provided ample opportunity to ask questions regarding this consent and the Services and have had  my questions answered to my satisfaction.  I give my informed consent for the services to be provided through the use of telemedicine in my medical care  By participating in this telemedicine visit I agree to the above.

## 2019-02-06 NOTE — Progress Notes (Signed)
Virtual Visit via Telephone Note   This visit type was conducted due to national recommendations for restrictions regarding the COVID-19 Pandemic (e.g. social distancing) in an effort to limit this patient's exposure and mitigate transmission in our community.  Due to her co-morbid illnesses, this patient is at least at moderate risk for complications without adequate follow up.  This format is felt to be most appropriate for this patient at this time.  The patient did not have access to video technology/had technical difficulties with video requiring transitioning to audio format only (telephone).  All issues noted in this document were discussed and addressed.  No physical exam could be performed with this format.  Please refer to the patient's chart for her  consent to telehealth for [facility].   Date:  02/07/2019   ID:  Kristina Berry, DOB [birth year], MRN [id number]  Patient Location: Home Provider Location: Office  PCP:  Curlene Labrum, MD  Cardiologist:  Rozann Lesches, MD Electrophysiologist:  None   Evaluation Performed:  Follow-Up Visit  Chief Complaint:   Cardiac follow-up  History of Present Illness:    Kristina Berry is a 73 y.o. female last seen in September 2019.  We spoke by phone today.  She does not report any significant palpitations and continues on Cardizem CD 240 mg daily.  She has been staying at home a lot during the pandemic, wears a mask when she goes out.  She and her husband are in the process of remodeling their kitchen.  Echocardiogram from October 2019 revealed LVEF 60 to 65% with septal dyssynergy in the setting of left bundle branch block, mild mitral regurgitation, and a small posterior pericardial effusion.   I reviewed her medications as outlined below.  She continues to follow with Dr. Pleas Koch.  She does not report any major interval changes in health.  The patient does not have symptoms concerning for COVID-19 infection (fever,  chills, cough, or new shortness of breath).    Past Medical History:  Diagnosis Date  . Anemia   . Anxiety   . Baker's cyst of knee   . Chronic low back pain   . Diastolic dysfunction   . Essential hypertension   . Family history of breast cancer   . Family history of melanoma   . Family history of stomach cancer   . Family history of uterine cancer   . GERD (gastroesophageal reflux disease)   . Hyperlipidemia   . Iron deficiency anemia 05/18/2016  . LBBB (left bundle branch block)   . Melanoma ([place])   . Mitral regurgitation    Mild to moderate 2011  . Skin cancer    basal cell removed from face/melanoma removed from rt arm  . Type 2 diabetes mellitus ([place])    Past Surgical History:  Procedure Laterality Date  . ADENOIDECTOMY    . BACK SURGERY  05/2014  . BIOPSY  05/28/2016   Procedure: BIOPSY;  Surgeon: Danie Binder, MD;  [facility];  Service: Endoscopy;;  duodenal and gastric  . CHOLECYSTECTOMY  2009  . COLONOSCOPY N/A 05/28/2016   Dr. Oneida Alar: one 8 mm tubular adenomas, diverticulosis in recto-sigmoid, sigmoid, and descending. Colonoscopy in 5-10 years if benefits outweigh the risks  . ESOPHAGOGASTRODUODENOSCOPY N/A 05/28/2016   Dr. Oneida Alar: multiple benign-appearing gastric polyps, gastritis  . GIVENS CAPSULE STUDY N/A 06/23/2016   Dr. Oneida Alar: mild ileitis  . MELANOMA SURGERY Right   . POLYPECTOMY  05/28/2016   Procedure: POLYPECTOMY;  Surgeon: Danie Binder, MD;  Location: [facility];  Service: Endoscopy;;  ascending colon   . SKIN CANCER EXCISION     Basal cell removal from left side of face  . TONSILLECTOMY       Current Meds  Medication Sig  . aspirin 81 MG tablet Take 1 tablet by mouth daily.  Marland Kitchen atorvastatin (LIPITOR) 20 MG tablet Take 1 tablet by mouth daily.  . Cholecalciferol (VITAMIN D3) 2000 units TABS Take 1 tablet by mouth daily.  Marland Kitchen diltiazem (CARDIZEM CD) 240 MG 24 hr capsule TAKE 1 CAPSULE BY MOUTH  DAILY  . fluticasone (FLONASE) 50  MCG/ACT nasal spray Place 2 sprays into both nostrils as needed for allergies or rhinitis.  . furosemide (LASIX) 40 MG tablet Take 40 mg by mouth 2 (two) times daily.   . insulin glargine (LANTUS) 100 UNIT/ML injection Inject 20 Units into the skin at bedtime.  . irbesartan (AVAPRO) 150 MG tablet Take 1 tablet by mouth daily.  Marland Kitchen ketoconazole (NIZORAL) 2 % cream as needed.   . loratadine (CLARITIN) 10 MG tablet Take 10 mg by mouth daily.  . metFORMIN (GLUCOPHAGE) 1000 MG tablet Take 1 tablet by mouth 2 (two) times daily.  Marland Kitchen omeprazole (PRILOSEC) 20 MG capsule Take 1 capsule by mouth daily.  Marland Kitchen PARoxetine (PAXIL) 10 MG tablet Take 0.5 tablets by mouth daily.  Marland Kitchen PROAIR HFA 108 (90 Base) MCG/ACT inhaler INHALE 1 TO 2 PUFFS BY MOUTH EVERY 4 TO 6 HOURS AS NEEDED FOR SHORTNESS OF BREATH OR WHEEZING  . sitaGLIPtin (JANUVIA) 100 MG tablet Take 1 tablet by mouth daily.     Allergies:   Cefdinir, Nsaids, and Sulfa antibiotics   Social History   Tobacco Use  . Smoking status: Never Smoker  . Smokeless tobacco: Never Used  Substance Use Topics  . Alcohol use: No    Alcohol/week: 0.0 standard drinks  . Drug use: No     Family Hx: The patient's family history includes Breast cancer (age of onset: 90) in her maternal aunt and sister; Breast cancer (age of onset: 16) in her sister; COPD in her sister; Dementia in her mother; Diabetes in her father and mother; Heart Problems in her father; Learning disabilities in her sister; Lung cancer in her sister; Melanoma in her mother; Osteoporosis in her mother; Stomach cancer in her maternal uncle; Uterine cancer (age of onset: 24) in an other family member. There is no history of Colon cancer.  ROS:   Please see the history of present illness. All other systems reviewed and are negative.   Prior CV studies:   The following studies were reviewed today:  Echocardiogram 02/01/2018: Study Conclusions  - Left ventricle: The cavity size was normal. Wall  thickness was   normal. Systolic function was normal. The estimated ejection   fraction was in the range of 55% to 60%. Wall motion was normal;   there were no regional wall motion abnormalities. Left   ventricular diastolic function parameters were normal for the   patient&'s age. - Ventricular septum: Septal motion showed dyssynergy consistent   with left bundle branch block. - Aortic valve: Mildly calcified annulus. Trileaflet. - Mitral valve: Mildly calcified annulus. There was mild   regurgitation. - Right atrium: Central venous pressure (est): 3 mm Hg. - Atrial septum: No defect or patent foramen ovale was identified. - Tricuspid valve: There was trivial regurgitation. - Pulmonary arteries: PA peak pressure: 22 mm Hg (S). - Pericardium, extracardiac: A small pericardial  effusion was   identified posterior to the heart.  Labs/Other Tests and Data Reviewed:    EKG:  An ECG dated 01/09/2018 was personally reviewed today and demonstrated:  Sinus rhythm with left bundle branch block.  Recent Labs: 12/20/2018: ALT 16; BUN 13; Creatinine, Ser 0.58; Hemoglobin 12.2; Platelets 234; Potassium 4.6; Sodium 138   Wt Readings from Last 3 Encounters:  02/07/19 206 lb (93.4 kg)  12/21/18 205 lb 11.2 oz (93.3 kg)  09/19/18 204 lb (92.5 kg)     Objective:    Vital Signs:  BP 140/60   Ht 5\' 2"  (1.575 m)   Wt 206 lb (93.4 kg)   BMI 37.68 kg/m    Patient spoke in full sentences, not short of breath. No audible wheezing or coughing. Speech pattern normal.  ASSESSMENT & PLAN:    1.  PSVT with history of palpitations, currently well controlled and continuing on Cardizem CD 240 mg daily.  We will plan a follow-up ECG for her next office visit.  2.  History of heart murmur.  Follow-up echocardiogram from October of last year shows no major valvular abnormalities, calcification of the aortic and mitral annulus with mild mitral regurgitation.  No clear indication for follow-up testing at this  time.  3.  Chronic left bundle branch block.  COVID-19 Education: The signs and symptoms of COVID-19 were discussed with the patient and how to seek care for testing (follow up with PCP or arrange E-visit).  The importance of social distancing was discussed today.  Time:   Today, I have spent 8 minutes with the patient with telehealth technology discussing the above problems.     Medication Adjustments/Labs and Tests Ordered: Current medicines are reviewed at length with the patient today.  Concerns regarding medicines are outlined above.   Tests Ordered: No orders of the defined types were placed in this encounter.   Medication Changes: No orders of the defined types were placed in this encounter.   Follow Up:  In Person 1 year in the [place] office.  Signed, Rozann Lesches, MD  02/07/2019 9:20 AM    [place]

## 2019-02-07 ENCOUNTER — Encounter: Payer: Self-pay | Admitting: Cardiology

## 2019-02-07 ENCOUNTER — Telehealth (INDEPENDENT_AMBULATORY_CARE_PROVIDER_SITE_OTHER): Payer: Medicare Other | Admitting: Cardiology

## 2019-02-07 VITALS — BP 140/60 | Ht 62.0 in | Wt 206.0 lb

## 2019-02-07 DIAGNOSIS — I471 Supraventricular tachycardia: Secondary | ICD-10-CM | POA: Diagnosis not present

## 2019-02-07 DIAGNOSIS — I447 Left bundle-branch block, unspecified: Secondary | ICD-10-CM | POA: Diagnosis not present

## 2019-02-07 NOTE — Patient Instructions (Addendum)

## 2019-02-09 DIAGNOSIS — E782 Mixed hyperlipidemia: Secondary | ICD-10-CM | POA: Diagnosis not present

## 2019-02-09 DIAGNOSIS — E1122 Type 2 diabetes mellitus with diabetic chronic kidney disease: Secondary | ICD-10-CM | POA: Diagnosis not present

## 2019-03-04 IMAGING — MR MR KNEE*L* W/O CM
4 of 7 series · 15 of 40 positions shown · non-contrast
Comparison: Radiographs from 02/20/2018

CLINICAL DATA: Left knee pain for 1 month.

EXAM:
MRI OF THE LEFT KNEE WITHOUT CONTRAST
TECHNIQUE: Multiplanar, multisequence MR imaging of the knee was performed. No
intravenous contrast was administered.

[Series 3: t2fs axial · axial · 4.0mm · 0.21mm/px · z∈[-44,+51]mm · 3 of 24 slices shown]
[im 5/24]
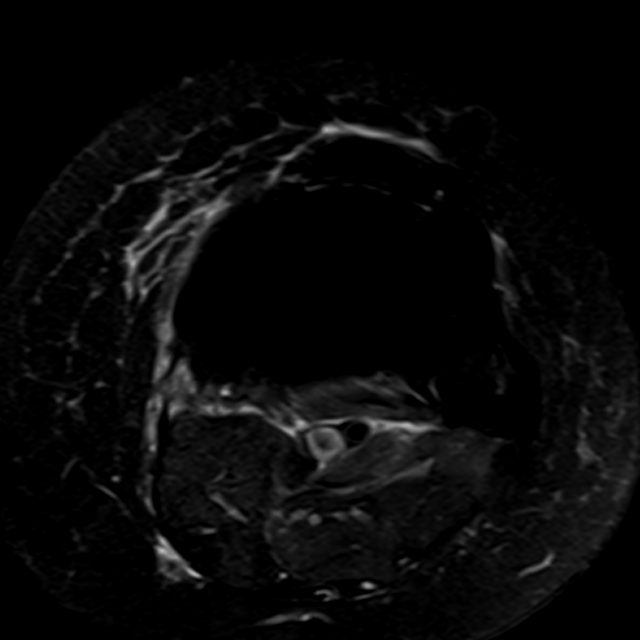
[im 14/24]
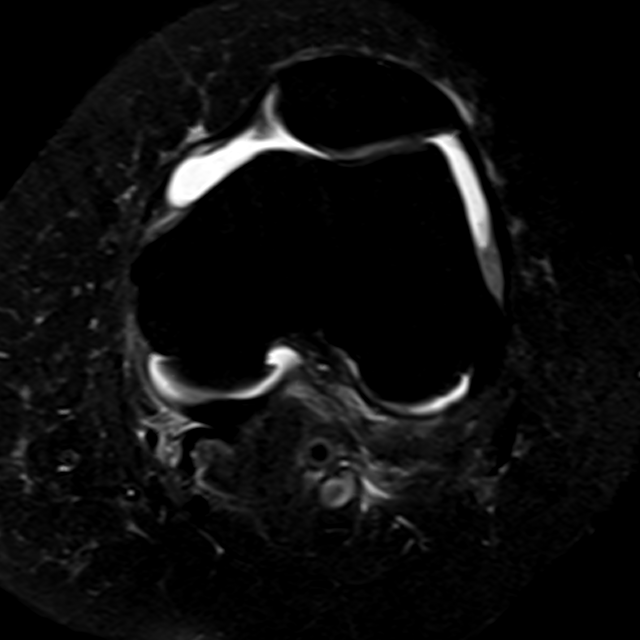
[im 24/24]
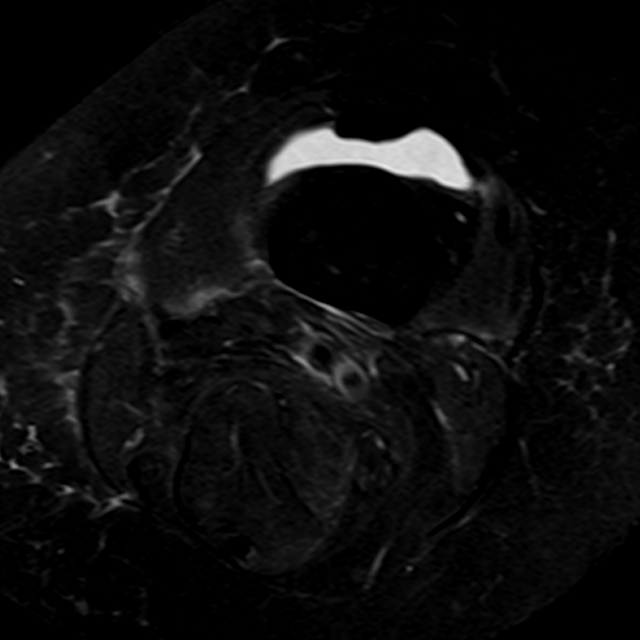

[Series 4: T1 · coronal · 4.0mm · 0.27mm/px · 6 of 30 slices shown]
[im 1/30]
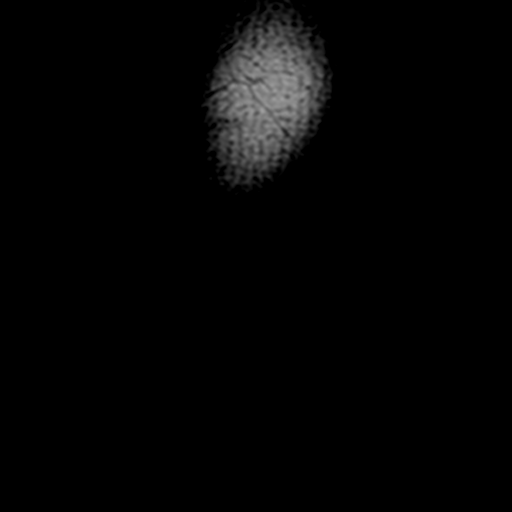
[im 6/30]
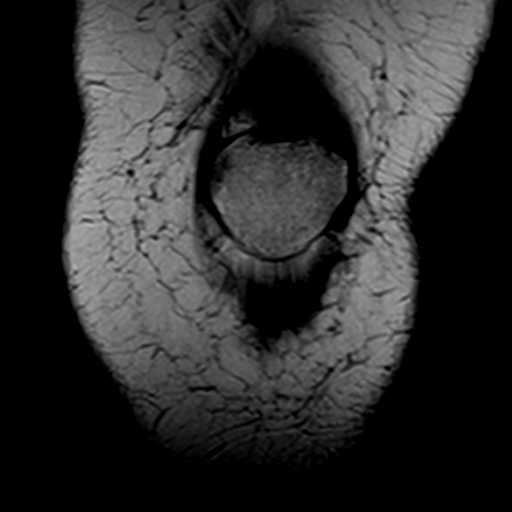
[im 12/30]
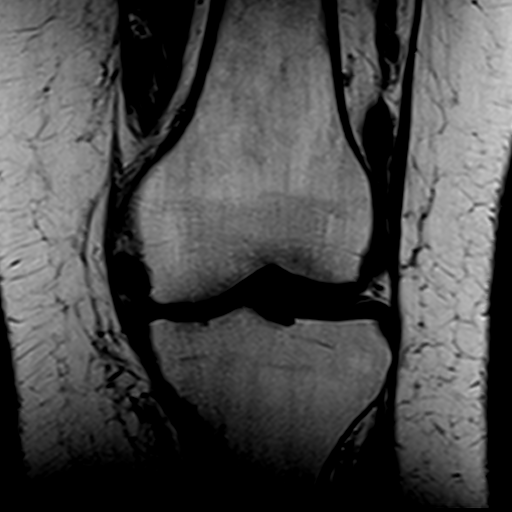
[im 18/30]
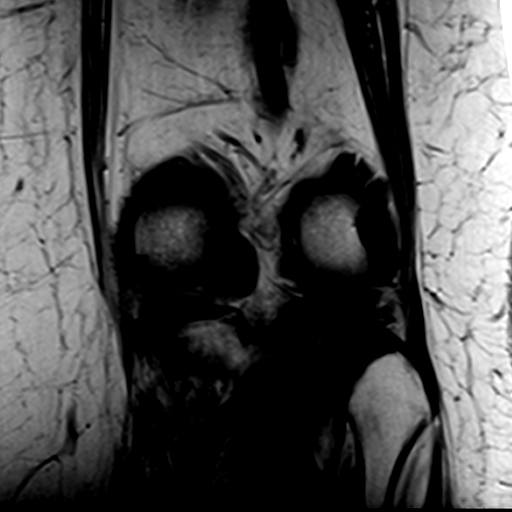
[im 24/30]
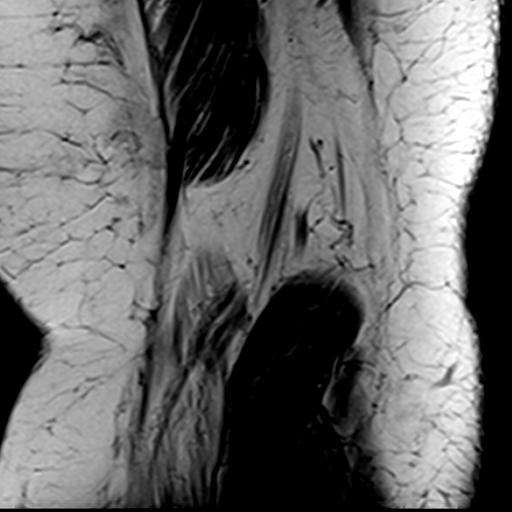
[im 30/30]
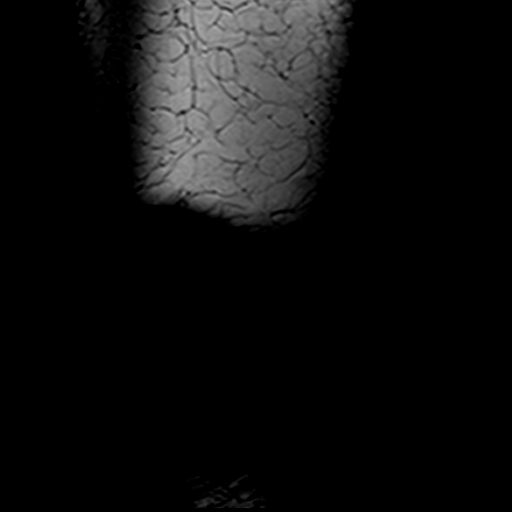

[Series 5: pdfs sag · sagittal · 3.0mm · 0.23mm/px · 3 of 29 slices shown]
[im 6/29]
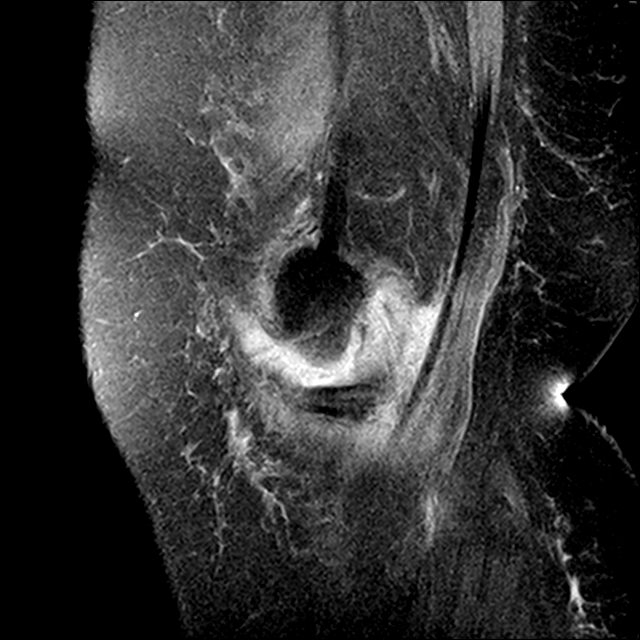
[im 17/29]
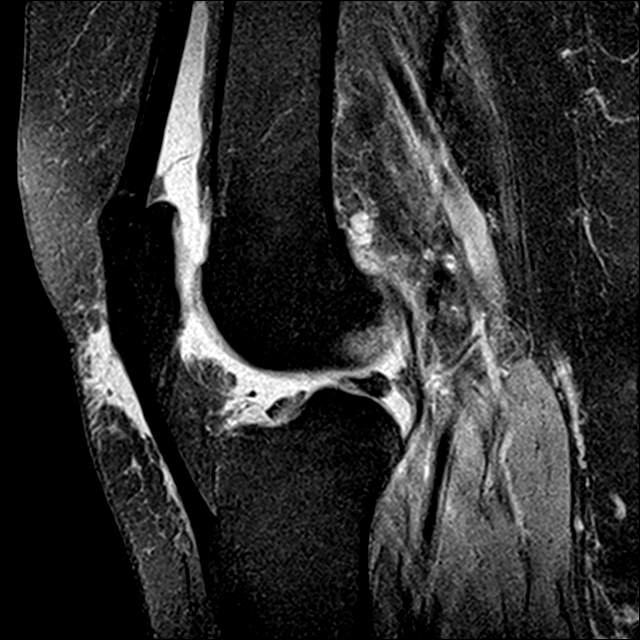
[im 29/29]
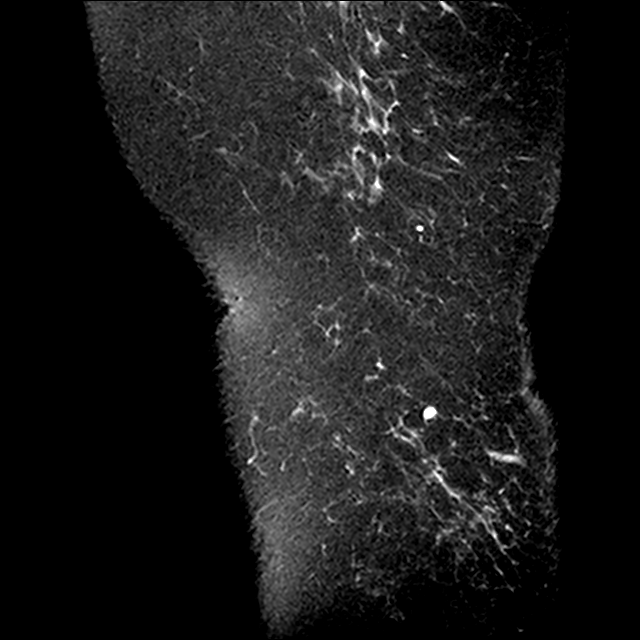

[Series 6: t2fs cor · coronal · 4.0mm · 0.28mm/px · 3 of 30 slices shown]
[im 6/30]
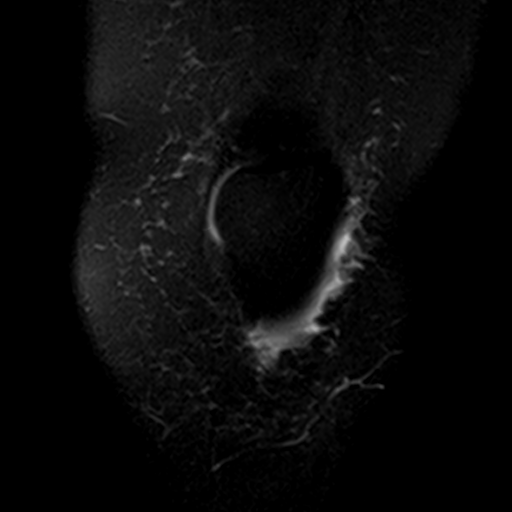
[im 18/30]
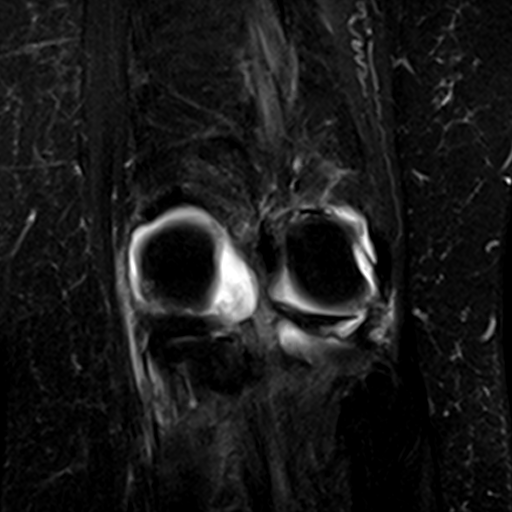
[im 30/30]
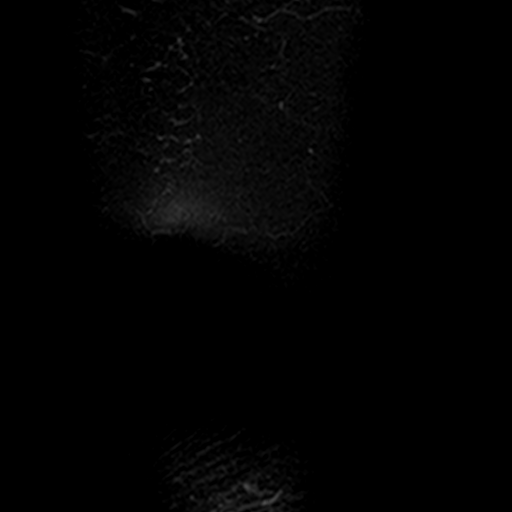

[15 of 40 positions shown; findings below may reference images not displayed]

FINDINGS: MENISCI

Medial meniscus: Large radial tear of the root of the posterior horn
medial meniscus. Adjacent degenerative signal with horizontal
tearing of the adjacent posterior horn extending to the superior
surface.

Lateral meniscus:  Unremarkable

LIGAMENTS

Cruciates:  Unremarkable

Collaterals:  Low-level edema adjacent to the distal MCL.

CARTILAGE

Patellofemoral: Mild chondral thinning along the patella. Minimal
marginal spurring.

Medial: Moderate to prominent degenerative chondral thinning with
mild marginal spurring.

Lateral:  Mild chondral thinning along the lateral femoral condyle.

Joint:  Large knee effusion.

Popliteal Fossa: Mild semimembranosus tibial collateral ligament
bursitis and mild pes anserine bursitis. Fatty atrophy of the medial
head gastrocnemius muscle.

Extensor Mechanism:  Prepatellar subcutaneous edema.

Bones: No significant extra-articular osseous abnormalities
identified.

Other: No supplemental non-categorized findings.
IMPRESSION: 1. Large radial tear of the root of the posterior horn medial
meniscus. Adjacent horizontal signal in the posterior horn involves
the superior surface.
2. Potential grade 1 sprain of the distal MCL.
3. Moderate to prominent degenerative chondral thinning in the
medial compartment. Mild chondral thinning along some of the
surfaces in the patellofemoral joint and lateral compartment.
4. Large knee effusion.
5. Mild semimembranosus-tibial collateral ligament bursitis and mild
pes anserine bursitis.
6. Atrophic medial head gastrocnemius.
7. Prepatellar subcutaneous edema.

## 2019-03-12 DIAGNOSIS — I5032 Chronic diastolic (congestive) heart failure: Secondary | ICD-10-CM | POA: Diagnosis not present

## 2019-03-12 DIAGNOSIS — E1165 Type 2 diabetes mellitus with hyperglycemia: Secondary | ICD-10-CM | POA: Diagnosis not present

## 2019-05-04 ENCOUNTER — Other Ambulatory Visit: Payer: Self-pay | Admitting: Cardiology

## 2019-05-11 DIAGNOSIS — I10 Essential (primary) hypertension: Secondary | ICD-10-CM | POA: Diagnosis not present

## 2019-05-11 DIAGNOSIS — E1165 Type 2 diabetes mellitus with hyperglycemia: Secondary | ICD-10-CM | POA: Diagnosis not present

## 2019-05-22 DIAGNOSIS — Z1231 Encounter for screening mammogram for malignant neoplasm of breast: Secondary | ICD-10-CM | POA: Diagnosis not present

## 2019-06-07 DIAGNOSIS — I10 Essential (primary) hypertension: Secondary | ICD-10-CM | POA: Diagnosis not present

## 2019-06-07 DIAGNOSIS — E7849 Other hyperlipidemia: Secondary | ICD-10-CM | POA: Diagnosis not present

## 2019-06-08 DIAGNOSIS — I10 Essential (primary) hypertension: Secondary | ICD-10-CM | POA: Diagnosis not present

## 2019-06-08 DIAGNOSIS — E7849 Other hyperlipidemia: Secondary | ICD-10-CM | POA: Diagnosis not present

## 2019-06-13 ENCOUNTER — Other Ambulatory Visit: Payer: Self-pay

## 2019-06-13 ENCOUNTER — Inpatient Hospital Stay (HOSPITAL_COMMUNITY): Payer: Medicare Other | Attending: Hematology

## 2019-06-13 DIAGNOSIS — D509 Iron deficiency anemia, unspecified: Secondary | ICD-10-CM | POA: Diagnosis not present

## 2019-06-13 DIAGNOSIS — K648 Other hemorrhoids: Secondary | ICD-10-CM | POA: Insufficient documentation

## 2019-06-13 DIAGNOSIS — K644 Residual hemorrhoidal skin tags: Secondary | ICD-10-CM | POA: Diagnosis not present

## 2019-06-13 DIAGNOSIS — D508 Other iron deficiency anemias: Secondary | ICD-10-CM

## 2019-06-13 DIAGNOSIS — K317 Polyp of stomach and duodenum: Secondary | ICD-10-CM | POA: Insufficient documentation

## 2019-06-13 DIAGNOSIS — Z79899 Other long term (current) drug therapy: Secondary | ICD-10-CM | POA: Insufficient documentation

## 2019-06-13 DIAGNOSIS — I10 Essential (primary) hypertension: Secondary | ICD-10-CM | POA: Insufficient documentation

## 2019-06-13 LAB — COMPREHENSIVE METABOLIC PANEL
ALT: 13 U/L (ref 0–44)
AST: 14 U/L — ABNORMAL LOW (ref 15–41)
Albumin: 3.7 g/dL (ref 3.5–5.0)
Alkaline Phosphatase: 79 U/L (ref 38–126)
Anion gap: 9 (ref 5–15)
BUN: 17 mg/dL (ref 8–23)
CO2: 31 mmol/L (ref 22–32)
Calcium: 8.9 mg/dL (ref 8.9–10.3)
Chloride: 99 mmol/L (ref 98–111)
Creatinine, Ser: 0.59 mg/dL (ref 0.44–1.00)
GFR calc Af Amer: >60 mL/min (ref >60)
GFR calc non Af Amer: >60 mL/min (ref >60)
Glucose, Bld: 232 mg/dL — ABNORMAL HIGH (ref 70–99)
Potassium: 4.1 mmol/L (ref 3.5–5.1)
Sodium: 139 mmol/L (ref 135–145)
Total Bilirubin: 0.7 mg/dL (ref 0.3–1.2)
Total Protein: 6.4 g/dL — ABNORMAL LOW (ref 6.5–8.1)

## 2019-06-13 LAB — IRON AND TIBC
Iron: 58 ug/dL (ref 28–170)
Saturation Ratios: 22 % (ref 10.4–31.8)
TIBC: 269 ug/dL (ref 250–450)
UIBC: 211 ug/dL

## 2019-06-13 LAB — CBC WITH DIFFERENTIAL/PLATELET
Abs Immature Granulocytes: 0.01 10*3/uL (ref 0.00–0.07)
Basophils Absolute: 0.1 10*3/uL (ref 0.0–0.1)
Basophils Relative: 1 %
Eosinophils Absolute: 0.3 10*3/uL (ref 0.0–0.5)
Eosinophils Relative: 4 %
HCT: 38 % (ref 36.0–46.0)
Hemoglobin: 12.2 g/dL (ref 12.0–15.0)
Immature Granulocytes: 0 %
Lymphocytes Relative: 29 %
Lymphs Abs: 2.2 10*3/uL (ref 0.7–4.0)
MCH: 29 pg (ref 26.0–34.0)
MCHC: 32.1 g/dL (ref 30.0–36.0)
MCV: 90.5 fL (ref 80.0–100.0)
Monocytes Absolute: 0.5 10*3/uL (ref 0.1–1.0)
Monocytes Relative: 6 %
Neutro Abs: 4.7 10*3/uL (ref 1.7–7.7)
Neutrophils Relative %: 60 %
Platelets: 236 10*3/uL (ref 150–400)
RBC: 4.2 MIL/uL (ref 3.87–5.11)
RDW: 12.6 % (ref 11.5–15.5)
WBC: 7.6 10*3/uL (ref 4.0–10.5)
nRBC: 0 % (ref 0.0–0.2)

## 2019-06-13 LAB — FERRITIN: Ferritin: 179 ng/mL (ref 11–307)

## 2019-06-20 ENCOUNTER — Inpatient Hospital Stay (HOSPITAL_COMMUNITY): Payer: Medicare Other | Admitting: Nurse Practitioner

## 2019-06-20 ENCOUNTER — Other Ambulatory Visit: Payer: Self-pay

## 2019-06-20 DIAGNOSIS — D509 Iron deficiency anemia, unspecified: Secondary | ICD-10-CM | POA: Diagnosis not present

## 2019-06-20 DIAGNOSIS — K648 Other hemorrhoids: Secondary | ICD-10-CM | POA: Diagnosis not present

## 2019-06-20 DIAGNOSIS — K644 Residual hemorrhoidal skin tags: Secondary | ICD-10-CM | POA: Diagnosis not present

## 2019-06-20 DIAGNOSIS — D508 Other iron deficiency anemias: Secondary | ICD-10-CM | POA: Diagnosis not present

## 2019-06-20 DIAGNOSIS — I10 Essential (primary) hypertension: Secondary | ICD-10-CM | POA: Diagnosis not present

## 2019-06-20 DIAGNOSIS — K317 Polyp of stomach and duodenum: Secondary | ICD-10-CM | POA: Diagnosis not present

## 2019-06-20 DIAGNOSIS — Z79899 Other long term (current) drug therapy: Secondary | ICD-10-CM | POA: Diagnosis not present

## 2019-06-20 NOTE — Assessment & Plan Note (Addendum)
1.  Iron deficiency anemia: -History of iron deficiency state from GI blood loss from ileus erosions.  EGD and colonoscopy in February 2018 showed benign gastric polyps, moderate gastritis, external and internal hemorrhoids. -She also has difficulty absorbing iron.  Previously on oral therapy with no improvement. -Denies any bleeding per rectum or melena. -Last Feraheme infusion was on 05/22/2018 and 05/29/2018.  She reports energy improvement after infusions. -Labs done on 06/13/2019 showed hemoglobin 12.2, ferritin 179, percent saturation 22 -She does not need any IV iron at this time. -She will follow-up in 6 months with repeat labs

## 2019-06-20 NOTE — Patient Instructions (Signed)
[facility] at [facility] Discharge Instructions  Follow up in 6 months with labs    Thank you for choosing [facility] at  [facility] to provide your oncology and hematology care.  To afford each patient quality time with our provider, please arrive at least 15 minutes before your scheduled appointment time.   If you have a lab appointment with the Cancer Center please come in thru the Main Entrance and check in at the main information desk.  You need to re-schedule your appointment should you arrive 10 or more minutes late.  We strive to give you quality time with our providers, and arriving late affects you and other patients whose appointments are after yours.  Also, if you no show three or more times for appointments you may be dismissed from the clinic at the providers discretion.     Again, thank you for choosing [facility].  Our hope is that these requests will decrease the amount of time that you wait before being seen by our physicians.       _____________________________________________________________  Should you have questions after your visit to [facility], please contact our office at [phone] between the hours of 8:00 a.m. and 4:30 p.m.  Voicemails left after 4:00 p.m. will not be returned until the following business day.  For prescription refill requests, have your pharmacy contact our office and allow 72 hours.    Due to Covid, you will need to wear a mask upon entering the hospital. If you do not have a mask, a mask will be given to you at the Main Entrance upon arrival. For doctor visits, patients may have 1 support person with them. For treatment visits, patients can not have anyone with them due to social distancing guidelines and our immunocompromised population.      

## 2019-06-20 NOTE — Progress Notes (Signed)
[place], [place] [address]   CLINIC:  Medical Oncology/Hematology  PCP:  Curlene Labrum, MD [place] [address] [phone]   REASON FOR VISIT: Follow-up for iron deficiency anemia  CURRENT THERAPY: Intermittent IV iron  BRIEF ONCOLOGIC HISTORY:  Oncology History  Melanoma [facility])   Initial Diagnosis   Melanoma ([place])   03/02/2018 Genetic Testing   MUTYH c.1187G>A heterozygous pathogenic variant found on the multicancer panel.  The Multi-Gene Panel offered by [facility] includes sequencing and/or deletion duplication testing of the following 84 genes: AIP, ALK, APC, ATM, AXIN2,BAP1,  BARD1, BLM, BMPR1A, BRCA1, BRCA2, BRIP1, CASR, CDC73, CDH1, CDK4, CDKN1B, CDKN1C, CDKN2A (p14ARF), CDKN2A (p16INK4a), CEBPA, CHEK2, CTNNA1, DICER1, DIS3L2, EGFR (c.2369C>T, p.Thr790Met variant only), EPCAM (Deletion/duplication testing only), FH, FLCN, GATA2, GPC3, GREM1 (Promoter region deletion/duplication testing only), HOXB13 (c.251G>A, p.Gly84Glu), HRAS, KIT, MAX, MEN1, MET, MITF (c.952G>A, p.Glu318Lys variant only), MLH1, MSH2, MSH3, MSH6, MUTYH, NBN, NF1, NF2, NTHL1, PALB2, PDGFRA, PHOX2B, PMS2, POLD1, POLE, POT1, PRKAR1A, PTCH1, PTEN, RAD50, RAD51C, RAD51D, RB1, RECQL4, RET, RUNX1, SDHAF2, SDHA (sequence changes only), SDHB, SDHC, SDHD, SMAD4, SMARCA4, SMARCB1, SMARCE1, STK11, SUFU, TERC, TERT, TMEM127, TP53, TSC1, TSC2, VHL, WRN and WT1.  The report date is 03/02/2018.      INTERVAL HISTORY:  Ms. Igou 74 y.o. female returns for routine follow-up for iron deficiency anemia.  Patient reports she has been doing well since her last visit.  She denies any severe fatigue.  She denies any bright red bleeding per rectum or melena.  She denies any easy bruising or bleeding. Denies any nausea, vomiting, or diarrhea. Denies any new pains. Had not noticed any recent bleeding such as epistaxis, hematuria or hematochezia. Denies recent chest pain on exertion,  shortness of breath on minimal exertion, pre-syncopal episodes, or palpitations. Denies any numbness or tingling in hands or feet. Denies any recent fevers, infections, or recent hospitalizations. Patient reports appetite at 100% and energy level at 75%.  She is eating well maintain her weight at this time.     REVIEW OF SYSTEMS:  Review of Systems  All other systems reviewed and are negative.    PAST MEDICAL/SURGICAL HISTORY:  Past Medical History:  Diagnosis Date  . Anemia   . Anxiety   . Baker's cyst of knee   . Chronic low back pain   . Diastolic dysfunction   . Essential hypertension   . Family history of breast cancer   . Family history of melanoma   . Family history of stomach cancer   . Family history of uterine cancer   . GERD (gastroesophageal reflux disease)   . Hyperlipidemia   . Iron deficiency anemia 05/18/2016  . LBBB (left bundle branch block)   . Melanoma ([place])   . Mitral regurgitation    Mild to moderate 2011  . Skin cancer    basal cell removed from face/melanoma removed from rt arm  . Type 2 diabetes mellitus ([place])    Past Surgical History:  Procedure Laterality Date  . ADENOIDECTOMY    . BACK SURGERY  05/2014  . BIOPSY  05/28/2016   Procedure: BIOPSY;  Surgeon: Danie Binder, MD;  [facility];  Service: Endoscopy;;  duodenal and gastric  . CHOLECYSTECTOMY  2009  . COLONOSCOPY N/A 05/28/2016   Dr. Oneida Alar: one 8 mm tubular adenomas, diverticulosis in recto-sigmoid, sigmoid, and descending. Colonoscopy in 5-10 years if benefits outweigh the risks  . ESOPHAGOGASTRODUODENOSCOPY N/A 05/28/2016  Dr. Oneida Alar: multiple benign-appearing gastric polyps, gastritis  . GIVENS CAPSULE STUDY N/A 06/23/2016   Dr. Oneida Alar: mild ileitis  . MELANOMA SURGERY Right   . POLYPECTOMY  05/28/2016   Procedure: POLYPECTOMY;  Surgeon: Danie Binder, MD;  [facility];  Service: Endoscopy;;  ascending colon   . SKIN CANCER EXCISION     Basal cell removal  from left side of face  . TONSILLECTOMY       SOCIAL HISTORY:  Social History   Socioeconomic History  . Marital status: Married    Spouse name: Not on file  . Number of children: Not on file  . Years of education: Not on file  . Highest education level: Not on file  Occupational History  . Not on file  Tobacco Use  . Smoking status: Never Smoker  . Smokeless tobacco: Never Used  Substance and Sexual Activity  . Alcohol use: No    Alcohol/week: 0.0 standard drinks  . Drug use: No  . Sexual activity: Not on file    Comment: married  Other Topics Concern  . Not on file  Social History Narrative  . Not on file   Social Determinants of Health   Financial Resource Strain:   . Difficulty of Paying Living Expenses: Not on file  Food Insecurity:   . Worried About Charity fundraiser in the Last Year: Not on file  . Ran Out of Food in the Last Year: Not on file  Transportation Needs:   . Lack of Transportation (Medical): Not on file  . Lack of Transportation (Non-Medical): Not on file  Physical Activity:   . Days of Exercise per Week: Not on file  . Minutes of Exercise per Session: Not on file  Stress:   . Feeling of Stress : Not on file  Social Connections:   . Frequency of Communication with Friends and Family: Not on file  . Frequency of Social Gatherings with Friends and Family: Not on file  . Attends Religious Services: Not on file  . Active Member of Clubs or Organizations: Not on file  . Attends Archivist Meetings: Not on file  . Marital Status: Not on file  Intimate Partner Violence:   . Fear of Current or Ex-Partner: Not on file  . Emotionally Abused: Not on file  . Physically Abused: Not on file  . Sexually Abused: Not on file    FAMILY HISTORY:  Family History  Problem Relation Age of Onset  . Osteoporosis Mother   . Dementia Mother   . Diabetes Mother   . Melanoma Mother        dx in her 62s  . Breast cancer Sister 6       triple  negative  . Heart Problems Father   . Diabetes Father   . Breast cancer Sister 62  . Learning disabilities Sister   . COPD Sister   . Lung cancer Sister   . Breast cancer Maternal Aunt 48  . Stomach cancer Maternal Uncle   . Uterine cancer Other 35  . Colon cancer Neg Hx     CURRENT MEDICATIONS:  Outpatient Encounter Medications as of 06/20/2019  Medication Sig  . aspirin 81 MG tablet Take 1 tablet by mouth daily.  Marland Kitchen atorvastatin (LIPITOR) 20 MG tablet Take 1 tablet by mouth daily.  . Cholecalciferol (VITAMIN D3) 2000 units TABS Take 1 tablet by mouth daily.  Marland Kitchen diltiazem (CARDIZEM CD) 240 MG 24 hr capsule TAKE 1  CAPSULE BY MOUTH  DAILY  . furosemide (LASIX) 40 MG tablet Take 40 mg by mouth 2 (two) times daily.   . insulin glargine (LANTUS) 100 UNIT/ML injection Inject 20 Units into the skin at bedtime.  . irbesartan (AVAPRO) 150 MG tablet Take 1 tablet by mouth daily.  Marland Kitchen loratadine (CLARITIN) 10 MG tablet Take 10 mg by mouth daily.  . metFORMIN (GLUCOPHAGE) 1000 MG tablet Take 1 tablet by mouth 2 (two) times daily.  Marland Kitchen omeprazole (PRILOSEC) 20 MG capsule Take 1 capsule by mouth daily.  Marland Kitchen PARoxetine (PAXIL) 10 MG tablet Take 0.5 tablets by mouth daily.  . sitaGLIPtin (JANUVIA) 100 MG tablet Take 1 tablet by mouth daily.  . fluticasone (FLONASE) 50 MCG/ACT nasal spray Place 2 sprays into both nostrils as needed for allergies or rhinitis.  Marland Kitchen ketoconazole (NIZORAL) 2 % cream as needed.   Marland Kitchen PROAIR HFA 108 (90 Base) MCG/ACT inhaler INHALE 1 TO 2 PUFFS BY MOUTH EVERY 4 TO 6 HOURS AS NEEDED FOR SHORTNESS OF BREATH OR WHEEZING   No facility-administered encounter medications on file as of 06/20/2019.    ALLERGIES:  Allergies  Allergen Reactions  . Cefdinir Other (See Comments)    Stomach cramps  . Nsaids Other (See Comments)    Stomach pain  . Sulfa Antibiotics Nausea Only     PHYSICAL EXAM:  ECOG Performance status: 1  Vitals:   06/20/19 1142  BP: (!) 148/88  Pulse: 89    Resp: 16  Temp: (!) 97.5 F (36.4 C)  SpO2: 98%   Filed Weights   06/20/19 1142  Weight: 209 lb (94.8 kg)    Physical Exam Constitutional:      Appearance: Normal appearance. She is normal weight.  Cardiovascular:     Rate and Rhythm: Normal rate and regular rhythm.     Heart sounds: Normal heart sounds.  Pulmonary:     Effort: Pulmonary effort is normal.     Breath sounds: Normal breath sounds.  Abdominal:     General: Bowel sounds are normal.     Palpations: Abdomen is soft.  Musculoskeletal:        General: Normal range of motion.  Skin:    General: Skin is warm.  Neurological:     Mental Status: She is alert and oriented to person, place, and time. Mental status is at baseline.  Psychiatric:        Mood and Affect: Mood normal.        Behavior: Behavior normal.        Thought Content: Thought content normal.        Judgment: Judgment normal.      LABORATORY DATA:  I have reviewed the labs as listed.  CBC    Component Value Date/Time   WBC 7.6 06/13/2019 1035   RBC 4.20 06/13/2019 1035   HGB 12.2 06/13/2019 1035   HCT 38.0 06/13/2019 1035   PLT 236 06/13/2019 1035   MCV 90.5 06/13/2019 1035   MCH 29.0 06/13/2019 1035   MCHC 32.1 06/13/2019 1035   RDW 12.6 06/13/2019 1035   LYMPHSABS 2.2 06/13/2019 1035   MONOABS 0.5 06/13/2019 1035   EOSABS 0.3 06/13/2019 1035   BASOSABS 0.1 06/13/2019 1035   CMP Latest Ref Rng & Units 06/13/2019 12/20/2018 05/09/2018  Glucose 70 - 99 mg/dL 232(H) 198(H) 156(H)  BUN 8 - 23 mg/dL '17 13 18  ' Creatinine 0.44 - 1.00 mg/dL 0.59 0.58 0.59  Sodium 135 - 145 mmol/L 139 138 142  Potassium 3.5 - 5.1 mmol/L 4.1 4.6 4.9  Chloride 98 - 111 mmol/L 99 100 104  CO2 22 - 32 mmol/L '31 31 29  ' Calcium 8.9 - 10.3 mg/dL 8.9 9.1 9.0  Total Protein 6.5 - 8.1 g/dL 6.4(L) 6.4(L) 6.4(L)  Total Bilirubin 0.3 - 1.2 mg/dL 0.7 0.8 0.7  Alkaline Phos 38 - 126 U/L 79 78 61  AST 15 - 41 U/L 14(L) 19 19  ALT 0 - 44 U/L '13 16 16     ' I personally  performed a face-to-face visit.  All questions were answered to patient's stated satisfaction. Encouraged patient to call with any new concerns or questions before his next visit to the cancer center and we can certain see him sooner, if needed.     ASSESSMENT & PLAN:   Iron deficiency anemia 1.  Iron deficiency anemia: -History of iron deficiency state from GI blood loss from ileus erosions.  EGD and colonoscopy in February 2018 showed benign gastric polyps, moderate gastritis, external and internal hemorrhoids. -She also has difficulty absorbing iron.  Previously on oral therapy with no improvement. -Denies any bleeding per rectum or melena. -Last Feraheme infusion was on 05/22/2018 and 05/29/2018.  She reports energy improvement after infusions. -Labs done on 06/13/2019 showed hemoglobin 12.2, ferritin 179, percent saturation 22 -She does not need any IV iron at this time. -She will follow-up in 6 months with repeat labs      Orders placed this encounter:  Orders Placed This Encounter  Procedures  . Lactate dehydrogenase  . Magnesium  . CBC with Differential/Platelet  . Comprehensive metabolic panel  . Ferritin  . Iron and TIBC  . Vitamin B12  . VITAMIN D 25 Hydroxy (Vit-D Deficiency, Fractures)  . Folate      Francene Finders, FNP-C [place] [phone]

## 2019-07-10 DIAGNOSIS — R5383 Other fatigue: Secondary | ICD-10-CM | POA: Diagnosis not present

## 2019-07-10 DIAGNOSIS — I10 Essential (primary) hypertension: Secondary | ICD-10-CM | POA: Diagnosis not present

## 2019-07-10 DIAGNOSIS — K219 Gastro-esophageal reflux disease without esophagitis: Secondary | ICD-10-CM | POA: Diagnosis not present

## 2019-07-10 DIAGNOSIS — E1165 Type 2 diabetes mellitus with hyperglycemia: Secondary | ICD-10-CM | POA: Diagnosis not present

## 2019-07-10 DIAGNOSIS — E870 Hyperosmolality and hypernatremia: Secondary | ICD-10-CM | POA: Diagnosis not present

## 2019-07-11 DIAGNOSIS — I10 Essential (primary) hypertension: Secondary | ICD-10-CM | POA: Diagnosis not present

## 2019-07-17 DIAGNOSIS — I447 Left bundle-branch block, unspecified: Secondary | ICD-10-CM | POA: Diagnosis not present

## 2019-07-17 DIAGNOSIS — I340 Nonrheumatic mitral (valve) insufficiency: Secondary | ICD-10-CM | POA: Diagnosis not present

## 2019-07-17 DIAGNOSIS — D500 Iron deficiency anemia secondary to blood loss (chronic): Secondary | ICD-10-CM | POA: Diagnosis not present

## 2019-07-17 DIAGNOSIS — I471 Supraventricular tachycardia: Secondary | ICD-10-CM | POA: Diagnosis not present

## 2019-07-17 DIAGNOSIS — E1165 Type 2 diabetes mellitus with hyperglycemia: Secondary | ICD-10-CM | POA: Diagnosis not present

## 2019-08-09 DIAGNOSIS — I5032 Chronic diastolic (congestive) heart failure: Secondary | ICD-10-CM | POA: Diagnosis not present

## 2019-08-09 DIAGNOSIS — I10 Essential (primary) hypertension: Secondary | ICD-10-CM | POA: Diagnosis not present

## 2019-08-10 DIAGNOSIS — I10 Essential (primary) hypertension: Secondary | ICD-10-CM | POA: Diagnosis not present

## 2019-08-10 DIAGNOSIS — I5032 Chronic diastolic (congestive) heart failure: Secondary | ICD-10-CM | POA: Diagnosis not present

## 2019-09-27 DIAGNOSIS — M545 Low back pain: Secondary | ICD-10-CM | POA: Diagnosis not present

## 2019-10-10 DIAGNOSIS — Z794 Long term (current) use of insulin: Secondary | ICD-10-CM | POA: Diagnosis not present

## 2019-10-10 DIAGNOSIS — E7849 Other hyperlipidemia: Secondary | ICD-10-CM | POA: Diagnosis not present

## 2019-10-10 DIAGNOSIS — I5032 Chronic diastolic (congestive) heart failure: Secondary | ICD-10-CM | POA: Diagnosis not present

## 2019-10-10 DIAGNOSIS — I110 Hypertensive heart disease with heart failure: Secondary | ICD-10-CM | POA: Diagnosis not present

## 2019-10-10 DIAGNOSIS — E1165 Type 2 diabetes mellitus with hyperglycemia: Secondary | ICD-10-CM | POA: Diagnosis not present

## 2019-10-22 ENCOUNTER — Inpatient Hospital Stay (HOSPITAL_COMMUNITY): Payer: Medicare Other | Attending: Hematology

## 2019-10-22 ENCOUNTER — Other Ambulatory Visit: Payer: Self-pay

## 2019-10-22 DIAGNOSIS — K317 Polyp of stomach and duodenum: Secondary | ICD-10-CM | POA: Insufficient documentation

## 2019-10-22 DIAGNOSIS — I10 Essential (primary) hypertension: Secondary | ICD-10-CM | POA: Insufficient documentation

## 2019-10-22 DIAGNOSIS — Z79899 Other long term (current) drug therapy: Secondary | ICD-10-CM | POA: Insufficient documentation

## 2019-10-22 DIAGNOSIS — D509 Iron deficiency anemia, unspecified: Secondary | ICD-10-CM | POA: Diagnosis not present

## 2019-10-22 DIAGNOSIS — K648 Other hemorrhoids: Secondary | ICD-10-CM | POA: Diagnosis not present

## 2019-10-22 DIAGNOSIS — K644 Residual hemorrhoidal skin tags: Secondary | ICD-10-CM | POA: Insufficient documentation

## 2019-10-22 DIAGNOSIS — D508 Other iron deficiency anemias: Secondary | ICD-10-CM

## 2019-10-22 LAB — COMPREHENSIVE METABOLIC PANEL
ALT: 14 U/L (ref 0–44)
AST: 15 U/L (ref 15–41)
Albumin: 3.6 g/dL (ref 3.5–5.0)
Alkaline Phosphatase: 74 U/L (ref 38–126)
Anion gap: 9 (ref 5–15)
BUN: 19 mg/dL (ref 8–23)
CO2: 28 mmol/L (ref 22–32)
Calcium: 9 mg/dL (ref 8.9–10.3)
Chloride: 100 mmol/L (ref 98–111)
Creatinine, Ser: 0.58 mg/dL (ref 0.44–1.00)
GFR calc Af Amer: >60 mL/min (ref >60)
GFR calc non Af Amer: >60 mL/min (ref >60)
Glucose, Bld: 256 mg/dL — ABNORMAL HIGH (ref 70–99)
Potassium: 4.8 mmol/L (ref 3.5–5.1)
Sodium: 137 mmol/L (ref 135–145)
Total Bilirubin: 0.6 mg/dL (ref 0.3–1.2)
Total Protein: 6.3 g/dL — ABNORMAL LOW (ref 6.5–8.1)

## 2019-10-22 LAB — CBC WITH DIFFERENTIAL/PLATELET
Abs Immature Granulocytes: 0.02 10*3/uL (ref 0.00–0.07)
Basophils Absolute: 0.1 10*3/uL (ref 0.0–0.1)
Basophils Relative: 1 %
Eosinophils Absolute: 0.3 10*3/uL (ref 0.0–0.5)
Eosinophils Relative: 4 %
HCT: 38.6 % (ref 36.0–46.0)
Hemoglobin: 12.3 g/dL (ref 12.0–15.0)
Immature Granulocytes: 0 %
Lymphocytes Relative: 30 %
Lymphs Abs: 1.9 10*3/uL (ref 0.7–4.0)
MCH: 28.9 pg (ref 26.0–34.0)
MCHC: 31.9 g/dL (ref 30.0–36.0)
MCV: 90.8 fL (ref 80.0–100.0)
Monocytes Absolute: 0.4 10*3/uL (ref 0.1–1.0)
Monocytes Relative: 6 %
Neutro Abs: 3.8 10*3/uL (ref 1.7–7.7)
Neutrophils Relative %: 59 %
Platelets: 242 10*3/uL (ref 150–400)
RBC: 4.25 MIL/uL (ref 3.87–5.11)
RDW: 12.7 % (ref 11.5–15.5)
WBC: 6.5 10*3/uL (ref 4.0–10.5)
nRBC: 0 % (ref 0.0–0.2)

## 2019-10-22 LAB — FERRITIN: Ferritin: 137 ng/mL (ref 11–307)

## 2019-10-22 LAB — IRON AND TIBC
Iron: 63 ug/dL (ref 28–170)
Saturation Ratios: 24 % (ref 10.4–31.8)
TIBC: 260 ug/dL (ref 250–450)
UIBC: 197 ug/dL

## 2019-10-22 LAB — FOLATE: Folate: 16.8 ng/mL (ref >5.9)

## 2019-10-22 MED ORDER — CYANOCOBALAMIN 1000 MCG/ML IJ SOLN
INTRAMUSCULAR | Status: AC
  Filled 2019-10-22: qty 1

## 2019-12-05 ENCOUNTER — Encounter (INDEPENDENT_AMBULATORY_CARE_PROVIDER_SITE_OTHER): Payer: Medicare Other | Admitting: Ophthalmology

## 2019-12-05 ENCOUNTER — Other Ambulatory Visit: Payer: Self-pay

## 2019-12-05 DIAGNOSIS — I10 Essential (primary) hypertension: Secondary | ICD-10-CM

## 2019-12-05 DIAGNOSIS — D3132 Benign neoplasm of left choroid: Secondary | ICD-10-CM

## 2019-12-05 DIAGNOSIS — D3131 Benign neoplasm of right choroid: Secondary | ICD-10-CM | POA: Diagnosis not present

## 2019-12-05 DIAGNOSIS — H353132 Nonexudative age-related macular degeneration, bilateral, intermediate dry stage: Secondary | ICD-10-CM | POA: Diagnosis not present

## 2019-12-05 DIAGNOSIS — H43813 Vitreous degeneration, bilateral: Secondary | ICD-10-CM

## 2019-12-05 DIAGNOSIS — H35033 Hypertensive retinopathy, bilateral: Secondary | ICD-10-CM

## 2019-12-11 DIAGNOSIS — I110 Hypertensive heart disease with heart failure: Secondary | ICD-10-CM | POA: Diagnosis not present

## 2019-12-11 DIAGNOSIS — E1165 Type 2 diabetes mellitus with hyperglycemia: Secondary | ICD-10-CM | POA: Diagnosis not present

## 2019-12-11 DIAGNOSIS — I5032 Chronic diastolic (congestive) heart failure: Secondary | ICD-10-CM | POA: Diagnosis not present

## 2019-12-11 DIAGNOSIS — Z794 Long term (current) use of insulin: Secondary | ICD-10-CM | POA: Diagnosis not present

## 2019-12-25 ENCOUNTER — Other Ambulatory Visit (HOSPITAL_COMMUNITY): Payer: Self-pay

## 2019-12-25 DIAGNOSIS — C4361 Malignant melanoma of right upper limb, including shoulder: Secondary | ICD-10-CM

## 2019-12-25 DIAGNOSIS — D500 Iron deficiency anemia secondary to blood loss (chronic): Secondary | ICD-10-CM

## 2019-12-25 DIAGNOSIS — D508 Other iron deficiency anemias: Secondary | ICD-10-CM

## 2019-12-26 ENCOUNTER — Inpatient Hospital Stay (HOSPITAL_COMMUNITY): Payer: Medicare Other

## 2019-12-26 ENCOUNTER — Other Ambulatory Visit: Payer: Self-pay

## 2019-12-26 ENCOUNTER — Inpatient Hospital Stay (HOSPITAL_COMMUNITY): Payer: Medicare Other | Attending: Nurse Practitioner | Admitting: Nurse Practitioner

## 2019-12-26 DIAGNOSIS — D508 Other iron deficiency anemias: Secondary | ICD-10-CM

## 2019-12-26 DIAGNOSIS — C4361 Malignant melanoma of right upper limb, including shoulder: Secondary | ICD-10-CM

## 2019-12-26 DIAGNOSIS — Z79899 Other long term (current) drug therapy: Secondary | ICD-10-CM | POA: Diagnosis not present

## 2019-12-26 DIAGNOSIS — D509 Iron deficiency anemia, unspecified: Secondary | ICD-10-CM | POA: Diagnosis not present

## 2019-12-26 DIAGNOSIS — D500 Iron deficiency anemia secondary to blood loss (chronic): Secondary | ICD-10-CM

## 2019-12-26 LAB — CBC WITH DIFFERENTIAL/PLATELET
Abs Immature Granulocytes: 0.02 10*3/uL (ref 0.00–0.07)
Basophils Absolute: 0 10*3/uL (ref 0.0–0.1)
Basophils Relative: 1 %
Eosinophils Absolute: 0.3 10*3/uL (ref 0.0–0.5)
Eosinophils Relative: 4 %
HCT: 36.1 % (ref 36.0–46.0)
Hemoglobin: 11.7 g/dL — ABNORMAL LOW (ref 12.0–15.0)
Immature Granulocytes: 0 %
Lymphocytes Relative: 25 %
Lymphs Abs: 1.8 10*3/uL (ref 0.7–4.0)
MCH: 29.3 pg (ref 26.0–34.0)
MCHC: 32.4 g/dL (ref 30.0–36.0)
MCV: 90.3 fL (ref 80.0–100.0)
Monocytes Absolute: 0.5 10*3/uL (ref 0.1–1.0)
Monocytes Relative: 6 %
Neutro Abs: 4.7 10*3/uL (ref 1.7–7.7)
Neutrophils Relative %: 64 %
Platelets: 240 10*3/uL (ref 150–400)
RBC: 4 MIL/uL (ref 3.87–5.11)
RDW: 12.7 % (ref 11.5–15.5)
WBC: 7.3 10*3/uL (ref 4.0–10.5)
nRBC: 0 % (ref 0.0–0.2)

## 2019-12-26 LAB — COMPREHENSIVE METABOLIC PANEL
ALT: 13 U/L (ref 0–44)
AST: 16 U/L (ref 15–41)
Albumin: 3.7 g/dL (ref 3.5–5.0)
Alkaline Phosphatase: 78 U/L (ref 38–126)
Anion gap: 9 (ref 5–15)
BUN: 16 mg/dL (ref 8–23)
CO2: 31 mmol/L (ref 22–32)
Calcium: 9.3 mg/dL (ref 8.9–10.3)
Chloride: 100 mmol/L (ref 98–111)
Creatinine, Ser: 0.53 mg/dL (ref 0.44–1.00)
GFR calc Af Amer: >60 mL/min (ref >60)
GFR calc non Af Amer: >60 mL/min (ref >60)
Glucose, Bld: 129 mg/dL — ABNORMAL HIGH (ref 70–99)
Potassium: 4.7 mmol/L (ref 3.5–5.1)
Sodium: 140 mmol/L (ref 135–145)
Total Bilirubin: 0.7 mg/dL (ref 0.3–1.2)
Total Protein: 6.7 g/dL (ref 6.5–8.1)

## 2019-12-26 LAB — VITAMIN D 25 HYDROXY (VIT D DEFICIENCY, FRACTURES): Vit D, 25-Hydroxy: 56.25 ng/mL (ref 30–100)

## 2019-12-26 LAB — IRON AND TIBC
Iron: 55 ug/dL (ref 28–170)
Saturation Ratios: 20 % (ref 10.4–31.8)
TIBC: 275 ug/dL (ref 250–450)
UIBC: 220 ug/dL

## 2019-12-26 LAB — LACTATE DEHYDROGENASE: LDH: 79 U/L — ABNORMAL LOW (ref 98–192)

## 2019-12-26 LAB — FOLATE: Folate: 16.1 ng/mL (ref >5.9)

## 2019-12-26 LAB — FERRITIN: Ferritin: 117 ng/mL (ref 11–307)

## 2019-12-26 LAB — VITAMIN B12: Vitamin B-12: 342 pg/mL (ref 180–914)

## 2019-12-26 LAB — MAGNESIUM: Magnesium: 1.5 mg/dL — ABNORMAL LOW (ref 1.7–2.4)

## 2019-12-26 NOTE — Assessment & Plan Note (Addendum)
1.  Iron deficiency anemia: -History of iron deficiency state from GI blood loss from ileus erosions.  EGD and colonoscopy in February 2018 showed benign gastric polyps, moderate gastritis, external and internal hemorrhoids. -She also has difficulty absorbing iron.  Previously on oral therapy with no improvement. -Denies any bleeding per rectum or melena. -Last Feraheme infusion was on 05/22/2018 and 05/29/2018. -She reports energy improvement after infusions. -Labs done on 12/26/2019 showed hemoglobin 11.7, ferritin 117, percent saturation 20 -We will set her up with 2 iron infusions -She will follow-up in 6 months with repeat labs

## 2019-12-26 NOTE — Progress Notes (Signed)
[place], [place] [address]   CLINIC:  Medical Oncology/Hematology  PCP:  Curlene Labrum, MD [place] [address] [phone]   REASON FOR VISIT: Follow-up for iron deficiency anemia   CURRENT THERAPY: Intermittent iron infusions  BRIEF ONCOLOGIC HISTORY:  Oncology History  Melanoma [facility])   Initial Diagnosis   Melanoma ([place])   03/02/2018 Genetic Testing   MUTYH c.1187G>A heterozygous pathogenic variant found on the multicancer panel.  The Multi-Gene Panel offered by [facility] includes sequencing and/or deletion duplication testing of the following 84 genes: AIP, ALK, APC, ATM, AXIN2,BAP1,  BARD1, BLM, BMPR1A, BRCA1, BRCA2, BRIP1, CASR, CDC73, CDH1, CDK4, CDKN1B, CDKN1C, CDKN2A (p14ARF), CDKN2A (p16INK4a), CEBPA, CHEK2, CTNNA1, DICER1, DIS3L2, EGFR (c.2369C>T, p.Thr790Met variant only), EPCAM (Deletion/duplication testing only), FH, FLCN, GATA2, GPC3, GREM1 (Promoter region deletion/duplication testing only), HOXB13 (c.251G>A, p.Gly84Glu), HRAS, KIT, MAX, MEN1, MET, MITF (c.952G>A, p.Glu318Lys variant only), MLH1, MSH2, MSH3, MSH6, MUTYH, NBN, NF1, NF2, NTHL1, PALB2, PDGFRA, PHOX2B, PMS2, POLD1, POLE, POT1, PRKAR1A, PTCH1, PTEN, RAD50, RAD51C, RAD51D, RB1, RECQL4, RET, RUNX1, SDHAF2, SDHA (sequence changes only), SDHB, SDHC, SDHD, SMAD4, SMARCA4, SMARCB1, SMARCE1, STK11, SUFU, TERC, TERT, TMEM127, TP53, TSC1, TSC2, VHL, WRN and WT1.  The report date is 03/02/2018.      INTERVAL HISTORY:  Kristina Berry 74 y.o. female returns for routine follow-up for iron deficiency anemia.  Patient reports she is a little bit more fatigued than she normally is.  She denies any bright red bleeding per rectum or melena.  She denies easy bruising or bleeding. Denies any nausea, vomiting, or diarrhea. Denies any new pains. Had not noticed any recent bleeding such as epistaxis, hematuria or hematochezia. Denies recent chest pain on exertion, shortness of  breath on minimal exertion, pre-syncopal episodes, or palpitations. Denies any numbness or tingling in hands or feet. Denies any recent fevers, infections, or recent hospitalizations. Patient reports appetite at 100% and energy level at 75%.  She is eating well maintain her weight at this time.     REVIEW OF SYSTEMS:  Review of Systems  Constitutional: Positive for fatigue.  All other systems reviewed and are negative.    PAST MEDICAL/SURGICAL HISTORY:  Past Medical History:  Diagnosis Date  . Anemia   . Anxiety   . Baker's cyst of knee   . Chronic low back pain   . Diastolic dysfunction   . Essential hypertension   . Family history of breast cancer   . Family history of melanoma   . Family history of stomach cancer   . Family history of uterine cancer   . GERD (gastroesophageal reflux disease)   . Hyperlipidemia   . Iron deficiency anemia 05/18/2016  . LBBB (left bundle branch block)   . Melanoma ([place])   . Mitral regurgitation    Mild to moderate 2011  . Skin cancer    basal cell removed from face/melanoma removed from rt arm  . Type 2 diabetes mellitus ([place])    Past Surgical History:  Procedure Laterality Date  . ADENOIDECTOMY    . BACK SURGERY  05/2014  . BIOPSY  05/28/2016   Procedure: BIOPSY;  Surgeon: Danie Binder, MD;  [facility];  Service: Endoscopy;;  duodenal and gastric  . CHOLECYSTECTOMY  2009  . COLONOSCOPY N/A 05/28/2016   Dr. Oneida Alar: one 8 mm tubular adenomas, diverticulosis in recto-sigmoid, sigmoid, and descending. Colonoscopy in 5-10 years if benefits outweigh the risks  . ESOPHAGOGASTRODUODENOSCOPY N/A 05/28/2016  Dr. Oneida Alar: multiple benign-appearing gastric polyps, gastritis  . GIVENS CAPSULE STUDY N/A 06/23/2016   Dr. Oneida Alar: mild ileitis  . MELANOMA SURGERY Right   . POLYPECTOMY  05/28/2016   Procedure: POLYPECTOMY;  Surgeon: Danie Binder, MD;  [facility];  Service: Endoscopy;;  ascending colon   . SKIN CANCER EXCISION      Basal cell removal from left side of face  . TONSILLECTOMY       SOCIAL HISTORY:  Social History   Socioeconomic History  . Marital status: Married    Spouse name: Not on file  . Number of children: Not on file  . Years of education: Not on file  . Highest education level: Not on file  Occupational History  . Not on file  Tobacco Use  . Smoking status: Never Smoker  . Smokeless tobacco: Never Used  Vaping Use  . Vaping Use: Never used  Substance and Sexual Activity  . Alcohol use: No    Alcohol/week: 0.0 standard drinks  . Drug use: No  . Sexual activity: Not on file    Comment: married  Other Topics Concern  . Not on file  Social History Narrative  . Not on file   Social Determinants of Health   Financial Resource Strain:   . Difficulty of Paying Living Expenses: Not on file  Food Insecurity:   . Worried About Charity fundraiser in the Last Year: Not on file  . Ran Out of Food in the Last Year: Not on file  Transportation Needs:   . Lack of Transportation (Medical): Not on file  . Lack of Transportation (Non-Medical): Not on file  Physical Activity:   . Days of Exercise per Week: Not on file  . Minutes of Exercise per Session: Not on file  Stress:   . Feeling of Stress : Not on file  Social Connections:   . Frequency of Communication with Friends and Family: Not on file  . Frequency of Social Gatherings with Friends and Family: Not on file  . Attends Religious Services: Not on file  . Active Member of Clubs or Organizations: Not on file  . Attends Archivist Meetings: Not on file  . Marital Status: Not on file  Intimate Partner Violence:   . Fear of Current or Ex-Partner: Not on file  . Emotionally Abused: Not on file  . Physically Abused: Not on file  . Sexually Abused: Not on file    FAMILY HISTORY:  Family History  Problem Relation Age of Onset  . Osteoporosis Mother   . Dementia Mother   . Diabetes Mother   . Melanoma Mother         dx in her 56s  . Breast cancer Sister 48       triple negative  . Heart Problems Father   . Diabetes Father   . Breast cancer Sister 43  . Learning disabilities Sister   . COPD Sister   . Lung cancer Sister   . Breast cancer Maternal Aunt 48  . Stomach cancer Maternal Uncle   . Uterine cancer Other 35  . Colon cancer Neg Hx     CURRENT MEDICATIONS:  Outpatient Encounter Medications as of 12/26/2019  Medication Sig  . aspirin 81 MG tablet Take 1 tablet by mouth daily.  Marland Kitchen atorvastatin (LIPITOR) 20 MG tablet Take 1 tablet by mouth daily.  . Cholecalciferol (VITAMIN D3) 2000 units TABS Take 1 tablet by mouth daily.  . cyclobenzaprine (  FLEXERIL) 5 MG tablet Take 5 mg by mouth at bedtime as needed.  . diltiazem (CARDIZEM CD) 240 MG 24 hr capsule TAKE 1 CAPSULE BY MOUTH  DAILY  . fluticasone (FLONASE) 50 MCG/ACT nasal spray Place 2 sprays into both nostrils as needed for allergies or rhinitis.  Marland Kitchen insulin glargine (LANTUS) 100 UNIT/ML injection Inject 20 Units into the skin at bedtime.  . irbesartan (AVAPRO) 150 MG tablet Take 1 tablet by mouth daily.  Marland Kitchen ketoconazole (NIZORAL) 2 % cream as needed.   Marland Kitchen LANTUS SOLOSTAR 100 UNIT/ML Solostar Pen Inject into the skin.  Marland Kitchen loratadine (CLARITIN) 10 MG tablet Take 10 mg by mouth daily.  . metFORMIN (GLUCOPHAGE) 1000 MG tablet Take 1 tablet by mouth 2 (two) times daily.  Marland Kitchen omeprazole (PRILOSEC) 20 MG capsule Take 1 capsule by mouth daily.  Marland Kitchen PARoxetine (PAXIL) 10 MG tablet Take 0.5 tablets by mouth daily.  Marland Kitchen PROAIR HFA 108 (90 Base) MCG/ACT inhaler INHALE 1 TO 2 PUFFS BY MOUTH EVERY 4 TO 6 HOURS AS NEEDED FOR SHORTNESS OF BREATH OR WHEEZING  . furosemide (LASIX) 40 MG tablet Take 40 mg by mouth 2 (two) times daily.   . sitaGLIPtin (JANUVIA) 100 MG tablet Take 1 tablet by mouth daily.   No facility-administered encounter medications on file as of 12/26/2019.    ALLERGIES:  Allergies  Allergen Reactions  . Cefdinir Other (See Comments)     Stomach cramps  . Nsaids Other (See Comments)    Stomach pain  . Sulfa Antibiotics Nausea Only     PHYSICAL EXAM:  ECOG Performance status: 1  Vitals:   12/26/19 1118  BP: (!) 160/56  Pulse: 71  Resp: 18  Temp: (!) 97 F (36.1 C)  SpO2: 96%   Filed Weights   12/26/19 1118  Weight: 206 lb 5.6 oz (93.6 kg)   Physical Exam Constitutional:      Appearance: Normal appearance. She is normal weight.  Cardiovascular:     Rate and Rhythm: Normal rate and regular rhythm.     Heart sounds: Normal heart sounds.  Pulmonary:     Effort: Pulmonary effort is normal.     Breath sounds: Normal breath sounds.  Abdominal:     General: Bowel sounds are normal.     Palpations: Abdomen is soft.  Musculoskeletal:        General: Normal range of motion.  Skin:    General: Skin is warm.  Neurological:     Mental Status: She is alert and oriented to person, place, and time. Mental status is at baseline.  Psychiatric:        Mood and Affect: Mood normal.        Behavior: Behavior normal.        Thought Content: Thought content normal.        Judgment: Judgment normal.      LABORATORY DATA:  I have reviewed the labs as listed.  CBC    Component Value Date/Time   WBC 7.3 12/26/2019 1025   RBC 4.00 12/26/2019 1025   HGB 11.7 (L) 12/26/2019 1025   HCT 36.1 12/26/2019 1025   PLT 240 12/26/2019 1025   MCV 90.3 12/26/2019 1025   MCH 29.3 12/26/2019 1025   MCHC 32.4 12/26/2019 1025   RDW 12.7 12/26/2019 1025   LYMPHSABS 1.8 12/26/2019 1025   MONOABS 0.5 12/26/2019 1025   EOSABS 0.3 12/26/2019 1025   BASOSABS 0.0 12/26/2019 1025   CMP Latest Ref Rng & Units 12/26/2019 10/22/2019  06/13/2019  Glucose 70 - 99 mg/dL 129(H) 256(H) 232(H)  BUN 8 - 23 mg/dL '16 19 17  ' Creatinine 0.44 - 1.00 mg/dL 0.53 0.58 0.59  Sodium 135 - 145 mmol/L 140 137 139  Potassium 3.5 - 5.1 mmol/L 4.7 4.8 4.1  Chloride 98 - 111 mmol/L 100 100 99  CO2 22 - 32 mmol/L '31 28 31  ' Calcium 8.9 - 10.3 mg/dL 9.3 9.0 8.9   Total Protein 6.5 - 8.1 g/dL 6.7 6.3(L) 6.4(L)  Total Bilirubin 0.3 - 1.2 mg/dL 0.7 0.6 0.7  Alkaline Phos 38 - 126 U/L 78 74 79  AST 15 - 41 U/L 16 15 14(L)  ALT 0 - 44 U/L '13 14 13     ' ASSESSMENT & PLAN:  Iron deficiency anemia 1.  Iron deficiency anemia: -History of iron deficiency state from GI blood loss from ileus erosions.  EGD and colonoscopy in February 2018 showed benign gastric polyps, moderate gastritis, external and internal hemorrhoids. -She also has difficulty absorbing iron.  Previously on oral therapy with no improvement. -Denies any bleeding per rectum or melena. -Last Feraheme infusion was on 05/22/2018 and 05/29/2018. -She reports energy improvement after infusions. -Labs done on 12/26/2019 showed hemoglobin 11.7, ferritin 117, percent saturation 20 -We will set her up with 2 iron infusions -She will follow-up in 6 months with repeat labs     Orders placed this encounter:  Orders Placed This Encounter  Procedures  . CBC with Differential/Platelet  . Comprehensive metabolic panel  . Ferritin  . Iron and TIBC  . Lactate dehydrogenase  . Vitamin B12  . VITAMIN D 25 Hydroxy (Vit-D Deficiency, Fractures)  . Folate      Francene Finders, FNP-C [place] [phone]

## 2019-12-27 ENCOUNTER — Inpatient Hospital Stay (HOSPITAL_COMMUNITY): Payer: Medicare Other

## 2019-12-27 VITALS — BP 133/45 | HR 70 | Temp 97.1°F | Resp 18

## 2019-12-27 DIAGNOSIS — D500 Iron deficiency anemia secondary to blood loss (chronic): Secondary | ICD-10-CM

## 2019-12-27 DIAGNOSIS — Z79899 Other long term (current) drug therapy: Secondary | ICD-10-CM | POA: Diagnosis not present

## 2019-12-27 DIAGNOSIS — D509 Iron deficiency anemia, unspecified: Secondary | ICD-10-CM | POA: Diagnosis not present

## 2019-12-27 MED ORDER — LORATADINE 10 MG PO TABS
10.0000 mg | ORAL_TABLET | Freq: Once | ORAL | Status: AC
  Administered 2019-12-27: 10 mg via ORAL
  Filled 2019-12-27: qty 1

## 2019-12-27 MED ORDER — SODIUM CHLORIDE 0.9 % IV SOLN: Freq: Once | INTRAVENOUS | Status: AC

## 2019-12-27 MED ORDER — SODIUM CHLORIDE 0.9 % IV SOLN
510.0000 mg | Freq: Once | INTRAVENOUS | Status: AC
  Administered 2019-12-27: 510 mg via INTRAVENOUS
  Filled 2019-12-27: qty 510

## 2019-12-27 MED ORDER — FAMOTIDINE 20 MG PO TABS
20.0000 mg | ORAL_TABLET | Freq: Once | ORAL | Status: AC
  Administered 2019-12-27: 20 mg via ORAL
  Filled 2019-12-27: qty 1

## 2019-12-27 MED ORDER — ACETAMINOPHEN 325 MG PO TABS
650.0000 mg | ORAL_TABLET | Freq: Once | ORAL | Status: AC
  Administered 2019-12-27: 650 mg via ORAL
  Filled 2019-12-27: qty 2

## 2019-12-27 NOTE — Patient Instructions (Signed)
[facility] at [facility]  Discharge Instructions:  Ferumoxytol injection What is this medicine? FERUMOXYTOL is an iron complex. Iron is used to make healthy red blood cells, which carry oxygen and nutrients throughout the body. This medicine is used to treat iron deficiency anemia. This medicine may be used for other purposes; ask your health care provider or pharmacist if you have questions. COMMON BRAND NAME(S): Feraheme What should I tell my health care provider before I take this medicine? They need to know if you have any of these conditions:  anemia not caused by low iron levels  high levels of iron in the blood  magnetic resonance imaging (MRI) test scheduled  an unusual or allergic reaction to iron, other medicines, foods, dyes, or preservatives  pregnant or trying to get pregnant  breast-feeding How should I use this medicine? This medicine is for injection into a vein. It is given by a health care professional in a hospital or clinic setting. Talk to your pediatrician regarding the use of this medicine in children. Special care may be needed. Overdosage: If you think you have taken too much of this medicine contact a poison control center or emergency room at once. NOTE: This medicine is only for you. Do not share this medicine with others. What if I miss a dose? It is important not to miss your dose. Call your doctor or health care professional if you are unable to keep an appointment. What may interact with this medicine? This medicine may interact with the following medications:  other iron products This list may not describe all possible interactions. Give your health care provider a list of all the medicines, herbs, non-prescription drugs, or dietary supplements you use. Also tell them if you smoke, drink alcohol, or use illegal drugs. Some items may interact with your medicine. What should I watch for while using this medicine? Visit your  doctor or healthcare professional regularly. Tell your doctor or healthcare professional if your symptoms do not start to get better or if they get worse. You may need blood work done while you are taking this medicine. You may need to follow a special diet. Talk to your doctor. Foods that contain iron include: whole grains/cereals, dried fruits, beans, or peas, leafy green vegetables, and organ meats (liver, kidney). What side effects may I notice from receiving this medicine? Side effects that you should report to your doctor or health care professional as soon as possible:  allergic reactions like skin rash, itching or hives, swelling of the face, lips, or tongue  breathing problems  changes in blood pressure  feeling faint or lightheaded, falls  fever or chills  flushing, sweating, or hot feelings  swelling of the ankles or feet Side effects that usually do not require medical attention (report to your doctor or health care professional if they continue or are bothersome):  diarrhea  headache  nausea, vomiting  stomach pain This list may not describe all possible side effects. Call your doctor for medical advice about side effects. You may report side effects to FDA at 1-800-FDA-1088. Where should I keep my medicine? This drug is given in a hospital or clinic and will not be stored at home. NOTE: This sheet is a summary. It may not cover all possible information. If you have questions about this medicine, talk to your doctor, pharmacist, or health care provider.  2020 Elsevier/Gold Standard (2016-05-17 20:21:10)  _____________________________________________________________  Thank you for choosing [facility] at   [facility] to provide your oncology and hematology care.  To afford each patient quality time with our providers, please arrive at least 15 minutes before your scheduled appointment.  You need to re-schedule your appointment if you arrive 10 or  more minutes late.  We strive to give you quality time with our providers, and arriving late affects you and other patients whose appointments are after yours.  Also, if you no show three or more times for appointments you may be dismissed from the clinic.  Again, thank you for choosing [facility] at [facility]. Our hope is that these requests will allow you access to exceptional care and in a timely manner. _______________________________________________________________  If you have questions after your visit, please contact our office at [phone] between the hours of 8:30 a.m. and 5:00 p.m. Voicemails left after 4:30 p.m. will not be returned until the following business day. _______________________________________________________________  For prescription refill requests, have your pharmacy contact our office. _______________________________________________________________  Recommendations made by the consultant and any test results will be sent to your referring physician. _______________________________________________________________ 

## 2019-12-27 NOTE — Progress Notes (Signed)
Kristina Berry presents today for IV iron infusion. Infusion tolerated without incident or complaint. See MAR for details. VSS prior to and post infusion. Discharged in satisfactory condition with follow up instructions.

## 2020-01-03 ENCOUNTER — Inpatient Hospital Stay (HOSPITAL_COMMUNITY): Payer: Medicare Other

## 2020-01-03 ENCOUNTER — Encounter (HOSPITAL_COMMUNITY): Payer: Self-pay

## 2020-01-03 VITALS — BP 118/48 | HR 65 | Temp 96.8°F | Resp 18

## 2020-01-03 DIAGNOSIS — D509 Iron deficiency anemia, unspecified: Secondary | ICD-10-CM | POA: Diagnosis not present

## 2020-01-03 DIAGNOSIS — Z79899 Other long term (current) drug therapy: Secondary | ICD-10-CM | POA: Diagnosis not present

## 2020-01-03 DIAGNOSIS — D500 Iron deficiency anemia secondary to blood loss (chronic): Secondary | ICD-10-CM

## 2020-01-03 MED ORDER — LORATADINE 10 MG PO TABS
ORAL_TABLET | ORAL | Status: AC
  Filled 2020-01-03: qty 1

## 2020-01-03 MED ORDER — SODIUM CHLORIDE 0.9 % IV SOLN
510.0000 mg | Freq: Once | INTRAVENOUS | Status: AC
  Administered 2020-01-03: 510 mg via INTRAVENOUS
  Filled 2020-01-03: qty 510

## 2020-01-03 MED ORDER — SODIUM CHLORIDE 0.9 % IV SOLN: Freq: Once | INTRAVENOUS | Status: AC

## 2020-01-03 MED ORDER — LORATADINE 10 MG PO TABS
10.0000 mg | ORAL_TABLET | Freq: Once | ORAL | Status: AC
  Administered 2020-01-03: 10 mg via ORAL

## 2020-01-03 MED ORDER — ACETAMINOPHEN 325 MG PO TABS
ORAL_TABLET | ORAL | Status: AC
  Filled 2020-01-03: qty 2

## 2020-01-03 MED ORDER — FAMOTIDINE 20 MG PO TABS
20.0000 mg | ORAL_TABLET | Freq: Once | ORAL | Status: AC
  Administered 2020-01-03: 20 mg via ORAL

## 2020-01-03 MED ORDER — FAMOTIDINE 20 MG PO TABS
ORAL_TABLET | ORAL | Status: AC
  Filled 2020-01-03: qty 1

## 2020-01-03 MED ORDER — ACETAMINOPHEN 325 MG PO TABS
650.0000 mg | ORAL_TABLET | Freq: Once | ORAL | Status: AC
  Administered 2020-01-03: 650 mg via ORAL

## 2020-01-03 NOTE — Progress Notes (Signed)
Tolerated infusion w/o adverse reaction.  Alert, in no distress.  VSS.  Discharged ambulatory in stable condition.  

## 2020-01-04 DIAGNOSIS — M1991 Primary osteoarthritis, unspecified site: Secondary | ICD-10-CM | POA: Diagnosis not present

## 2020-01-04 DIAGNOSIS — M545 Low back pain: Secondary | ICD-10-CM | POA: Diagnosis not present

## 2020-01-04 DIAGNOSIS — M25551 Pain in right hip: Secondary | ICD-10-CM | POA: Diagnosis not present

## 2020-01-10 DIAGNOSIS — I5032 Chronic diastolic (congestive) heart failure: Secondary | ICD-10-CM | POA: Diagnosis not present

## 2020-01-10 DIAGNOSIS — Z794 Long term (current) use of insulin: Secondary | ICD-10-CM | POA: Diagnosis not present

## 2020-01-10 DIAGNOSIS — I110 Hypertensive heart disease with heart failure: Secondary | ICD-10-CM | POA: Diagnosis not present

## 2020-01-10 DIAGNOSIS — E1165 Type 2 diabetes mellitus with hyperglycemia: Secondary | ICD-10-CM | POA: Diagnosis not present

## 2020-01-24 DIAGNOSIS — R5383 Other fatigue: Secondary | ICD-10-CM | POA: Diagnosis not present

## 2020-01-24 DIAGNOSIS — E1165 Type 2 diabetes mellitus with hyperglycemia: Secondary | ICD-10-CM | POA: Diagnosis not present

## 2020-01-24 DIAGNOSIS — R011 Cardiac murmur, unspecified: Secondary | ICD-10-CM | POA: Diagnosis not present

## 2020-01-24 DIAGNOSIS — E782 Mixed hyperlipidemia: Secondary | ICD-10-CM | POA: Diagnosis not present

## 2020-01-24 DIAGNOSIS — I10 Essential (primary) hypertension: Secondary | ICD-10-CM | POA: Diagnosis not present

## 2020-01-24 DIAGNOSIS — E559 Vitamin D deficiency, unspecified: Secondary | ICD-10-CM | POA: Diagnosis not present

## 2020-01-24 DIAGNOSIS — K219 Gastro-esophageal reflux disease without esophagitis: Secondary | ICD-10-CM | POA: Diagnosis not present

## 2020-01-29 DIAGNOSIS — I471 Supraventricular tachycardia: Secondary | ICD-10-CM | POA: Diagnosis not present

## 2020-01-29 DIAGNOSIS — E1165 Type 2 diabetes mellitus with hyperglycemia: Secondary | ICD-10-CM | POA: Diagnosis not present

## 2020-01-29 DIAGNOSIS — Z0001 Encounter for general adult medical examination with abnormal findings: Secondary | ICD-10-CM | POA: Diagnosis not present

## 2020-01-29 DIAGNOSIS — D500 Iron deficiency anemia secondary to blood loss (chronic): Secondary | ICD-10-CM | POA: Diagnosis not present

## 2020-01-29 DIAGNOSIS — Z1389 Encounter for screening for other disorder: Secondary | ICD-10-CM | POA: Diagnosis not present

## 2020-01-29 DIAGNOSIS — Z23 Encounter for immunization: Secondary | ICD-10-CM | POA: Diagnosis not present

## 2020-01-29 DIAGNOSIS — I10 Essential (primary) hypertension: Secondary | ICD-10-CM | POA: Diagnosis not present

## 2020-03-11 DIAGNOSIS — I110 Hypertensive heart disease with heart failure: Secondary | ICD-10-CM | POA: Diagnosis not present

## 2020-03-11 DIAGNOSIS — E1165 Type 2 diabetes mellitus with hyperglycemia: Secondary | ICD-10-CM | POA: Diagnosis not present

## 2020-03-11 DIAGNOSIS — Z794 Long term (current) use of insulin: Secondary | ICD-10-CM | POA: Diagnosis not present

## 2020-03-11 DIAGNOSIS — I5032 Chronic diastolic (congestive) heart failure: Secondary | ICD-10-CM | POA: Diagnosis not present

## 2020-04-11 DIAGNOSIS — I5032 Chronic diastolic (congestive) heart failure: Secondary | ICD-10-CM | POA: Diagnosis not present

## 2020-04-11 DIAGNOSIS — E1165 Type 2 diabetes mellitus with hyperglycemia: Secondary | ICD-10-CM | POA: Diagnosis not present

## 2020-04-11 DIAGNOSIS — I110 Hypertensive heart disease with heart failure: Secondary | ICD-10-CM | POA: Diagnosis not present

## 2020-04-11 DIAGNOSIS — Z794 Long term (current) use of insulin: Secondary | ICD-10-CM | POA: Diagnosis not present

## 2020-05-05 ENCOUNTER — Other Ambulatory Visit: Payer: Self-pay | Admitting: Cardiology

## 2020-05-10 DIAGNOSIS — I5032 Chronic diastolic (congestive) heart failure: Secondary | ICD-10-CM | POA: Diagnosis not present

## 2020-05-10 DIAGNOSIS — I110 Hypertensive heart disease with heart failure: Secondary | ICD-10-CM | POA: Diagnosis not present

## 2020-05-10 DIAGNOSIS — Z794 Long term (current) use of insulin: Secondary | ICD-10-CM | POA: Diagnosis not present

## 2020-05-10 DIAGNOSIS — E1165 Type 2 diabetes mellitus with hyperglycemia: Secondary | ICD-10-CM | POA: Diagnosis not present

## 2020-05-13 ENCOUNTER — Ambulatory Visit: Payer: Medicare Other | Admitting: Physician Assistant

## 2020-05-21 ENCOUNTER — Ambulatory Visit: Payer: Medicare Other | Admitting: Cardiology

## 2020-05-21 ENCOUNTER — Encounter: Payer: Self-pay | Admitting: Cardiology

## 2020-05-21 VITALS — BP 118/64 | HR 71 | Ht 62.0 in | Wt 212.6 lb

## 2020-05-21 DIAGNOSIS — I471 Supraventricular tachycardia: Secondary | ICD-10-CM | POA: Diagnosis not present

## 2020-05-21 DIAGNOSIS — R011 Cardiac murmur, unspecified: Secondary | ICD-10-CM | POA: Diagnosis not present

## 2020-05-21 NOTE — Progress Notes (Signed)
Cardiology Office Note  Date: 05/21/2020   ID: Kristina Berry, DOB 06-Sep-1945, MRN [id number]  PCP:  Curlene Labrum, MD  Cardiologist:  Rozann Lesches, MD Electrophysiologist:  None   Chief Complaint  Patient presents with  . Cardiac follow-up    History of Present Illness: Kristina Berry is a 75 y.o. female last assessed via telehealth encounter in October 2020.  She presents for a routine visit.  Reports no progressive palpitations, tolerating diltiazem CD 240 mg daily.  I personally reviewed her ECG today which shows sinus rhythm with IVCD consistent with left bundle branch block.  She does not describe any dizziness or syncope.  She continues to follow with Dr. Pleas Koch for primary care.  Her last echocardiogram from 2019 as outlined below.  Past Medical History:  Diagnosis Date  . Anemia   . Anxiety   . Baker's cyst of knee   . Chronic low back pain   . Diastolic dysfunction   . Essential hypertension   . Family history of breast cancer   . Family history of melanoma   . Family history of stomach cancer   . Family history of uterine cancer   . GERD (gastroesophageal reflux disease)   . Hyperlipidemia   . Iron deficiency anemia 05/18/2016  . LBBB (left bundle branch block)   . Melanoma ([place])   . Mitral regurgitation    Mild to moderate 2011  . Skin cancer    basal cell removed from face/melanoma removed from rt arm  . Type 2 diabetes mellitus ([place])     Past Surgical History:  Procedure Laterality Date  . ADENOIDECTOMY    . BACK SURGERY  05/2014  . BIOPSY  05/28/2016   Procedure: BIOPSY;  Surgeon: Danie Binder, MD;  [facility];  Service: Endoscopy;;  duodenal and gastric  . CHOLECYSTECTOMY  2009  . COLONOSCOPY N/A 05/28/2016   Dr. Oneida Alar: one 8 mm tubular adenomas, diverticulosis in recto-sigmoid, sigmoid, and descending. Colonoscopy in 5-10 years if benefits outweigh the risks  . ESOPHAGOGASTRODUODENOSCOPY N/A 05/28/2016   Dr.  Oneida Alar: multiple benign-appearing gastric polyps, gastritis  . GIVENS CAPSULE STUDY N/A 06/23/2016   Dr. Oneida Alar: mild ileitis  . MELANOMA SURGERY Right   . POLYPECTOMY  05/28/2016   Procedure: POLYPECTOMY;  Surgeon: Danie Binder, MD;  [facility]: [facility];  Service: Endoscopy;;  ascending colon   . SKIN CANCER EXCISION     Basal cell removal from left side of face  . TONSILLECTOMY      Current Outpatient Medications  Medication Sig Dispense Refill  . aspirin 81 MG tablet Take 1 tablet by mouth daily.    Marland Kitchen atorvastatin (LIPITOR) 20 MG tablet Take 1 tablet by mouth daily.    Renard Hamper Pepper-Turmeric (TURMERIC CURCUMIN) 08-998 MG CAPS Take 1 capsule by mouth every other day.    . Cholecalciferol (VITAMIN D3) 2000 units TABS Take 1 tablet by mouth daily.    . cyclobenzaprine (FLEXERIL) 5 MG tablet Take 5 mg by mouth at bedtime as needed.    . diltiazem (CARDIZEM CD) 240 MG 24 hr capsule Take 1 capsule by mouth once daily 30 capsule 3  . fluticasone (FLONASE) 50 MCG/ACT nasal spray Place 2 sprays into both nostrils as needed for allergies or rhinitis.    . furosemide (LASIX) 40 MG tablet Take 40 mg by mouth 2 (two) times daily.     . irbesartan (AVAPRO) 150 MG tablet Take 1 tablet by  mouth daily.    Marland Kitchen ketoconazole (NIZORAL) 2 % cream as needed.     Marland Kitchen LANTUS SOLOSTAR 100 UNIT/ML Solostar Pen Inject 20 Units into the skin at bedtime.    Marland Kitchen loratadine (CLARITIN) 10 MG tablet Take 10 mg by mouth daily.    . metFORMIN (GLUCOPHAGE) 1000 MG tablet Take 1 tablet by mouth 2 (two) times daily.    Marland Kitchen omeprazole (PRILOSEC) 20 MG capsule Take 1 capsule by mouth daily.    Marland Kitchen PARoxetine (PAXIL) 10 MG tablet Take 0.5 tablets by mouth daily.    Marland Kitchen PROAIR HFA 108 (90 Base) MCG/ACT inhaler INHALE 1 TO 2 PUFFS BY MOUTH EVERY 4 TO 6 HOURS AS NEEDED FOR SHORTNESS OF BREATH OR WHEEZING  2  . sitaGLIPtin (JANUVIA) 100 MG tablet Take 1 tablet by mouth daily.     No current facility-administered medications for this  visit.   Allergies:  Cefdinir, Nsaids, and Sulfa antibiotics   ROS: No syncope.  Physical Exam: VS:  BP 118/64   Pulse 71   Ht 5\' 2"  (1.575 m)   Wt 212 lb 9.6 oz (96.4 kg)   SpO2 95%   BMI 38.89 kg/m , BMI Body mass index is 38.89 kg/m.  Wt Readings from Last 3 Encounters:  05/21/20 212 lb 9.6 oz (96.4 kg)  12/26/19 206 lb 5.6 oz (93.6 kg)  06/20/19 209 lb (94.8 kg)    General: Patient appears comfortable at rest. HEENT: Conjunctiva and lids normal, wearing a mask. Neck: Supple, no elevated JVP or carotid bruits, no thyromegaly. Lungs: Clear to auscultation, nonlabored breathing at rest. Cardiac: Regular rate and rhythm, no S3, 2/6 right basal systolic murmur, no pericardial rub. Extremities: No pitting edema.  ECG:  An ECG dated 01/09/2018 was personally reviewed today and demonstrated:  Sinus rhythm with left bundle branch block.  Recent Labwork: 12/26/2019: ALT 13; AST 16; BUN 16; Creatinine, Ser 0.53; Hemoglobin 11.7; Magnesium 1.5; Platelets 240; Potassium 4.7; Sodium 140   Other Studies Reviewed Today:  Echocardiogram 02/01/2018: Study Conclusions  - Left ventricle: The cavity size was normal. Wall thickness was normal. Systolic function was normal. The estimated ejection fraction was in the range of 55% to 60%. Wall motion was normal; there were no regional wall motion abnormalities. Left ventricular diastolic function parameters were normal for the patient&'s age. - Ventricular septum: Septal motion showed dyssynergy consistent with left bundle branch block. - Aortic valve: Mildly calcified annulus. Trileaflet. - Mitral valve: Mildly calcified annulus. There was mild regurgitation. - Right atrium: Central venous pressure (est): 3 mm Hg. - Atrial septum: No defect or patent foramen ovale was identified. - Tricuspid valve: There was trivial regurgitation. - Pulmonary arteries: PA peak pressure: 22 mm Hg (S). - Pericardium, extracardiac: A small  pericardial effusion was identified posterior to the heart.  Assessment and Plan:  1.  PSVT, doing well without progressive palpitations on diltiazem CD 240 mg daily.  ECG reviewed and stable.  No changes were made.  2.  Cardiac murmur, stable.  Last echocardiogram did reveal mild valvular calcification and mild mitral regurgitation.  Continue to follow on examination for now.  Medication Adjustments/Labs and Tests Ordered: Current medicines are reviewed at length with the patient today.  Concerns regarding medicines are outlined above.   Tests Ordered: Orders Placed This Encounter  Procedures  . EKG 12-Lead    Medication Changes: No orders of the defined types were placed in this encounter.   Disposition:  Follow up 1 year in the  [place] office.  Signed, Satira Sark, MD, [facility] 05/21/2020 11:10 AM    [facility] [place], [place], [place] [address] Phone: [phone]; Fax: [phone]

## 2020-05-21 NOTE — Patient Instructions (Signed)

## 2020-06-09 DIAGNOSIS — I110 Hypertensive heart disease with heart failure: Secondary | ICD-10-CM | POA: Diagnosis not present

## 2020-06-09 DIAGNOSIS — E1165 Type 2 diabetes mellitus with hyperglycemia: Secondary | ICD-10-CM | POA: Diagnosis not present

## 2020-06-09 DIAGNOSIS — Z794 Long term (current) use of insulin: Secondary | ICD-10-CM | POA: Diagnosis not present

## 2020-06-09 DIAGNOSIS — I5032 Chronic diastolic (congestive) heart failure: Secondary | ICD-10-CM | POA: Diagnosis not present

## 2020-06-23 ENCOUNTER — Other Ambulatory Visit (HOSPITAL_COMMUNITY): Payer: Self-pay

## 2020-06-23 DIAGNOSIS — D500 Iron deficiency anemia secondary to blood loss (chronic): Secondary | ICD-10-CM

## 2020-06-23 DIAGNOSIS — C4361 Malignant melanoma of right upper limb, including shoulder: Secondary | ICD-10-CM

## 2020-06-24 ENCOUNTER — Other Ambulatory Visit: Payer: Self-pay

## 2020-06-24 ENCOUNTER — Inpatient Hospital Stay (HOSPITAL_COMMUNITY): Payer: Medicare Other | Attending: Hematology

## 2020-06-24 DIAGNOSIS — Z803 Family history of malignant neoplasm of breast: Secondary | ICD-10-CM | POA: Diagnosis not present

## 2020-06-24 DIAGNOSIS — K219 Gastro-esophageal reflux disease without esophagitis: Secondary | ICD-10-CM | POA: Diagnosis not present

## 2020-06-24 DIAGNOSIS — C4361 Malignant melanoma of right upper limb, including shoulder: Secondary | ICD-10-CM

## 2020-06-24 DIAGNOSIS — Z794 Long term (current) use of insulin: Secondary | ICD-10-CM | POA: Diagnosis not present

## 2020-06-24 DIAGNOSIS — Z7982 Long term (current) use of aspirin: Secondary | ICD-10-CM | POA: Diagnosis not present

## 2020-06-24 DIAGNOSIS — Z800 Family history of malignant neoplasm of digestive organs: Secondary | ICD-10-CM | POA: Insufficient documentation

## 2020-06-24 DIAGNOSIS — Z85820 Personal history of malignant melanoma of skin: Secondary | ICD-10-CM | POA: Insufficient documentation

## 2020-06-24 DIAGNOSIS — I10 Essential (primary) hypertension: Secondary | ICD-10-CM | POA: Insufficient documentation

## 2020-06-24 DIAGNOSIS — E119 Type 2 diabetes mellitus without complications: Secondary | ICD-10-CM | POA: Diagnosis not present

## 2020-06-24 DIAGNOSIS — Z808 Family history of malignant neoplasm of other organs or systems: Secondary | ICD-10-CM | POA: Diagnosis not present

## 2020-06-24 DIAGNOSIS — Z79899 Other long term (current) drug therapy: Secondary | ICD-10-CM | POA: Diagnosis not present

## 2020-06-24 DIAGNOSIS — D509 Iron deficiency anemia, unspecified: Secondary | ICD-10-CM | POA: Diagnosis not present

## 2020-06-24 DIAGNOSIS — Z86010 Personal history of colonic polyps: Secondary | ICD-10-CM | POA: Diagnosis not present

## 2020-06-24 DIAGNOSIS — E785 Hyperlipidemia, unspecified: Secondary | ICD-10-CM | POA: Insufficient documentation

## 2020-06-24 DIAGNOSIS — D500 Iron deficiency anemia secondary to blood loss (chronic): Secondary | ICD-10-CM

## 2020-06-24 LAB — CBC WITH DIFFERENTIAL/PLATELET
Abs Immature Granulocytes: 0.01 10*3/uL (ref 0.00–0.07)
Basophils Absolute: 0.1 10*3/uL (ref 0.0–0.1)
Basophils Relative: 1 %
Eosinophils Absolute: 0.3 10*3/uL (ref 0.0–0.5)
Eosinophils Relative: 4 %
HCT: 41.4 % (ref 36.0–46.0)
Hemoglobin: 13 g/dL (ref 12.0–15.0)
Immature Granulocytes: 0 %
Lymphocytes Relative: 29 %
Lymphs Abs: 2.1 10*3/uL (ref 0.7–4.0)
MCH: 29.1 pg (ref 26.0–34.0)
MCHC: 31.4 g/dL (ref 30.0–36.0)
MCV: 92.8 fL (ref 80.0–100.0)
Monocytes Absolute: 0.5 10*3/uL (ref 0.1–1.0)
Monocytes Relative: 7 %
Neutro Abs: 4.3 10*3/uL (ref 1.7–7.7)
Neutrophils Relative %: 59 %
Platelets: 240 10*3/uL (ref 150–400)
RBC: 4.46 MIL/uL (ref 3.87–5.11)
RDW: 12.2 % (ref 11.5–15.5)
WBC: 7.2 10*3/uL (ref 4.0–10.5)
nRBC: 0 % (ref 0.0–0.2)

## 2020-06-24 LAB — COMPREHENSIVE METABOLIC PANEL
ALT: 14 U/L (ref 0–44)
AST: 17 U/L (ref 15–41)
Albumin: 3.8 g/dL (ref 3.5–5.0)
Alkaline Phosphatase: 74 U/L (ref 38–126)
Anion gap: 11 (ref 5–15)
BUN: 14 mg/dL (ref 8–23)
CO2: 29 mmol/L (ref 22–32)
Calcium: 9.2 mg/dL (ref 8.9–10.3)
Chloride: 99 mmol/L (ref 98–111)
Creatinine, Ser: 0.56 mg/dL (ref 0.44–1.00)
GFR, Estimated: >60 mL/min (ref >60)
Glucose, Bld: 180 mg/dL — ABNORMAL HIGH (ref 70–99)
Potassium: 4.4 mmol/L (ref 3.5–5.1)
Sodium: 139 mmol/L (ref 135–145)
Total Bilirubin: 0.7 mg/dL (ref 0.3–1.2)
Total Protein: 6.7 g/dL (ref 6.5–8.1)

## 2020-06-24 LAB — IRON AND TIBC
Iron: 70 ug/dL (ref 28–170)
Saturation Ratios: 27 % (ref 10.4–31.8)
TIBC: 258 ug/dL (ref 250–450)
UIBC: 188 ug/dL

## 2020-06-24 LAB — LACTATE DEHYDROGENASE: LDH: 85 U/L — ABNORMAL LOW (ref 98–192)

## 2020-06-24 LAB — FOLATE: Folate: 16.4 ng/mL (ref >5.9)

## 2020-06-24 LAB — FERRITIN: Ferritin: 299 ng/mL (ref 11–307)

## 2020-06-24 LAB — VITAMIN D 25 HYDROXY (VIT D DEFICIENCY, FRACTURES): Vit D, 25-Hydroxy: 54.57 ng/mL (ref 30–100)

## 2020-06-24 LAB — VITAMIN B12: Vitamin B-12: 324 pg/mL (ref 180–914)

## 2020-07-01 ENCOUNTER — Other Ambulatory Visit: Payer: Self-pay

## 2020-07-01 ENCOUNTER — Inpatient Hospital Stay (HOSPITAL_COMMUNITY): Payer: Medicare Other | Admitting: Physician Assistant

## 2020-07-01 VITALS — BP 127/61 | HR 80 | Temp 97.0°F | Resp 19 | Wt 208.3 lb

## 2020-07-01 DIAGNOSIS — Z86010 Personal history of colonic polyps: Secondary | ICD-10-CM | POA: Diagnosis not present

## 2020-07-01 DIAGNOSIS — Z85820 Personal history of malignant melanoma of skin: Secondary | ICD-10-CM | POA: Diagnosis not present

## 2020-07-01 DIAGNOSIS — Z803 Family history of malignant neoplasm of breast: Secondary | ICD-10-CM | POA: Diagnosis not present

## 2020-07-01 DIAGNOSIS — Z808 Family history of malignant neoplasm of other organs or systems: Secondary | ICD-10-CM | POA: Diagnosis not present

## 2020-07-01 DIAGNOSIS — K219 Gastro-esophageal reflux disease without esophagitis: Secondary | ICD-10-CM | POA: Diagnosis not present

## 2020-07-01 DIAGNOSIS — I10 Essential (primary) hypertension: Secondary | ICD-10-CM | POA: Diagnosis not present

## 2020-07-01 DIAGNOSIS — Z7982 Long term (current) use of aspirin: Secondary | ICD-10-CM | POA: Diagnosis not present

## 2020-07-01 DIAGNOSIS — E785 Hyperlipidemia, unspecified: Secondary | ICD-10-CM | POA: Diagnosis not present

## 2020-07-01 DIAGNOSIS — E119 Type 2 diabetes mellitus without complications: Secondary | ICD-10-CM | POA: Diagnosis not present

## 2020-07-01 DIAGNOSIS — Z79899 Other long term (current) drug therapy: Secondary | ICD-10-CM | POA: Diagnosis not present

## 2020-07-01 DIAGNOSIS — Z800 Family history of malignant neoplasm of digestive organs: Secondary | ICD-10-CM | POA: Diagnosis not present

## 2020-07-01 DIAGNOSIS — D500 Iron deficiency anemia secondary to blood loss (chronic): Secondary | ICD-10-CM | POA: Diagnosis not present

## 2020-07-01 DIAGNOSIS — D509 Iron deficiency anemia, unspecified: Secondary | ICD-10-CM | POA: Diagnosis not present

## 2020-07-01 DIAGNOSIS — Z794 Long term (current) use of insulin: Secondary | ICD-10-CM | POA: Diagnosis not present

## 2020-07-01 NOTE — Progress Notes (Signed)
Kristina Berry, [place] [address]   CLINIC:  Medical Oncology/Hematology  PCP:  Curlene Labrum, MD [place] [id number] [phone]   REASON FOR VISIT:  Follow-up for iron deficiency anemia  CURRENT THERAPY: Intermittent IV iron infusions  INTERVAL HISTORY:  Kristina Berry 75 y.o. female returns for routine follow-up of her iron deficiency anemia.  She was last seen in clinic on 12/26/2019.  History of iron deficiency state from GI blood loss from ileus erosions.  EGD and colonoscopy in February 2018 showed benign gastric polyps, moderate gastritis, polyp (tubular adenoma), external and internal hemorrhoids.  She reports that she has been doing well since her last visit.  She reports good energy levels, denies fatigue.  She continues to take her vitamin D3, magnesium, vitamin C supplements.  She does not take oral iron, due to suspected malabsorption.  She denies recent chest pain on exertion, shortness of breath on minimal exertion, pre-syncopal episodes, or palpitations. She had not noticed any recent bleeding such as epistaxis, hematuria, hematemesis, hematochezia, or melena. She does take sporadic Aleve for muscle spasms, but limits her doses due to stomach upset.  She takes 81 mg of aspirin daily. She denies pica and eats a variety of diet.  She has 100% energy and 100% appetite. She endorses that she is maintaining a stable weight.   REVIEW OF SYSTEMS:  Review of Systems  Constitutional: Negative for appetite change, chills, diaphoresis, fatigue, fever and unexpected weight change.  HENT:   Negative for lump/mass and nosebleeds.   Eyes: Negative for eye problems.  Respiratory: Negative for cough, hemoptysis and shortness of breath.   Cardiovascular: Negative for chest pain, leg swelling and palpitations.  Gastrointestinal: Negative for abdominal pain, blood in stool, constipation, diarrhea, nausea and vomiting.  Genitourinary:  Negative for hematuria.   Skin: Negative.   Neurological: Negative for dizziness, headaches and light-headedness.  Hematological: Does not bruise/bleed easily.      PAST MEDICAL/SURGICAL HISTORY:  Past Medical History:  Diagnosis Date  . Anemia   . Anxiety   . Baker's cyst of knee   . Chronic low back pain   . Diastolic dysfunction   . Essential hypertension   . Family history of breast cancer   . Family history of melanoma   . Family history of stomach cancer   . Family history of uterine cancer   . GERD (gastroesophageal reflux disease)   . Hyperlipidemia   . Iron deficiency anemia 05/18/2016  . LBBB (left bundle branch block)   . Melanoma ([place])   . Mitral regurgitation    Mild to moderate 2011  . Skin cancer    basal cell removed from face/melanoma removed from rt arm  . Type 2 diabetes mellitus ([place])    Past Surgical History:  Procedure Laterality Date  . ADENOIDECTOMY    . BACK SURGERY  05/2014  . BIOPSY  05/28/2016   Procedure: BIOPSY;  Surgeon: Danie Binder, MD;  [facility];  Service: Endoscopy;;  duodenal and gastric  . CHOLECYSTECTOMY  2009  . COLONOSCOPY N/A 05/28/2016   Dr. Oneida Alar: one 8 mm tubular adenomas, diverticulosis in recto-sigmoid, sigmoid, and descending. Colonoscopy in 5-10 years if benefits outweigh the risks  . ESOPHAGOGASTRODUODENOSCOPY N/A 05/28/2016   Dr. Oneida Alar: multiple benign-appearing gastric polyps, gastritis  . GIVENS CAPSULE STUDY N/A 06/23/2016   Dr. Oneida Alar: mild ileitis  . MELANOMA SURGERY Right   . POLYPECTOMY  05/28/2016  Procedure: POLYPECTOMY;  Surgeon: Danie Binder, MD;  [facility];  Service: Endoscopy;;  ascending colon   . SKIN CANCER EXCISION     Basal cell removal from left side of face  . TONSILLECTOMY       SOCIAL HISTORY:  Social History   Socioeconomic History  . Marital status: Married    Spouse name: Not on file  . Number of children: Not on file  . Years of education: Not on file  .  Highest education level: Not on file  Occupational History  . Not on file  Tobacco Use  . Smoking status: Never Smoker  . Smokeless tobacco: Never Used  Vaping Use  . Vaping Use: Never used  Substance and Sexual Activity  . Alcohol use: No    Alcohol/week: 0.0 standard drinks  . Drug use: No  . Sexual activity: Not on file    Comment: married  Other Topics Concern  . Not on file  Social History Narrative  . Not on file   Social Determinants of Health   Financial Resource Strain: Not on file  Food Insecurity: Not on file  Transportation Needs: Not on file  Physical Activity: Not on file  Stress: Not on file  Social Connections: Not on file  Intimate Partner Violence: Not on file    FAMILY HISTORY:  Family History  Problem Relation Age of Onset  . Osteoporosis Mother   . Dementia Mother   . Diabetes Mother   . Melanoma Mother        dx in her 46s  . Breast cancer Sister 4       triple negative  . Heart Problems Father   . Diabetes Father   . Breast cancer Sister 14  . Learning disabilities Sister   . COPD Sister   . Lung cancer Sister   . Breast cancer Maternal Aunt 48  . Stomach cancer Maternal Uncle   . Uterine cancer Other 35  . Colon cancer Neg Hx     CURRENT MEDICATIONS:  Outpatient Encounter Medications as of 07/01/2020  Medication Sig  . aspirin 81 MG tablet Take 1 tablet by mouth daily.  Marland Kitchen atorvastatin (LIPITOR) 20 MG tablet Take 1 tablet by mouth daily.  Renard Hamper Pepper-Turmeric (TURMERIC CURCUMIN) 08-998 MG CAPS Take 1 capsule by mouth every other day.  . Cholecalciferol (VITAMIN D3) 2000 units TABS Take 1 tablet by mouth daily.  . cyclobenzaprine (FLEXERIL) 5 MG tablet Take 5 mg by mouth at bedtime as needed.  . diltiazem (CARDIZEM CD) 240 MG 24 hr capsule Take 1 capsule by mouth once daily  . fluticasone (FLONASE) 50 MCG/ACT nasal spray Place 2 sprays into both nostrils as needed for allergies or rhinitis.  . furosemide (LASIX) 40 MG tablet Take  40 mg by mouth 2 (two) times daily.   . irbesartan (AVAPRO) 150 MG tablet Take 1 tablet by mouth daily.  Marland Kitchen ketoconazole (NIZORAL) 2 % cream as needed.   Marland Kitchen LANTUS SOLOSTAR 100 UNIT/ML Solostar Pen Inject 20 Units into the skin at bedtime.  Marland Kitchen loratadine (CLARITIN) 10 MG tablet Take 10 mg by mouth daily.  . metFORMIN (GLUCOPHAGE) 1000 MG tablet Take 1 tablet by mouth 2 (two) times daily.  Marland Kitchen omeprazole (PRILOSEC) 20 MG capsule Take 1 capsule by mouth daily.  Marland Kitchen PARoxetine (PAXIL) 10 MG tablet Take 0.5 tablets by mouth daily.  Marland Kitchen PROAIR HFA 108 (90 Base) MCG/ACT inhaler INHALE 1 TO 2 PUFFS BY MOUTH EVERY 4  TO 6 HOURS AS NEEDED FOR SHORTNESS OF BREATH OR WHEEZING  . sitaGLIPtin (JANUVIA) 100 MG tablet Take 1 tablet by mouth daily.   No facility-administered encounter medications on file as of 07/01/2020.    ALLERGIES:  Allergies  Allergen Reactions  . Cefdinir Other (See Comments)    Stomach cramps  . Nsaids Other (See Comments)    Stomach pain  . Sulfa Antibiotics Nausea Only     PHYSICAL EXAM:  ECOG PERFORMANCE STATUS: 0 - Asymptomatic  There were no vitals filed for this visit. There were no vitals filed for this visit. Physical Exam Constitutional:      Appearance: Normal appearance. She is obese.  HENT:     Head: Normocephalic and atraumatic.     Mouth/Throat:     Mouth: Mucous membranes are moist.  Eyes:     Extraocular Movements: Extraocular movements intact.     Pupils: Pupils are equal, round, and reactive to light.  Cardiovascular:     Rate and Rhythm: Normal rate and regular rhythm.     Pulses: Normal pulses.     Heart sounds: Normal heart sounds.  Pulmonary:     Effort: Pulmonary effort is normal.     Breath sounds: Normal breath sounds.  Abdominal:     General: Bowel sounds are normal.     Palpations: Abdomen is soft.     Tenderness: There is no abdominal tenderness.  Musculoskeletal:        General: No swelling.     Right lower leg: Edema (Trace ankle edema)  present.     Left lower leg: Edema (Trace ankle edema) present.  Lymphadenopathy:     Cervical: No cervical adenopathy.  Skin:    General: Skin is warm and dry.  Neurological:     General: No focal deficit present.     Mental Status: She is alert and oriented to person, place, and time.  Psychiatric:        Mood and Affect: Mood normal.        Behavior: Behavior normal.      LABORATORY DATA:  I have reviewed the labs as listed.  CBC    Component Value Date/Time   WBC 7.2 06/24/2020 1050   RBC 4.46 06/24/2020 1050   HGB 13.0 06/24/2020 1050   HCT 41.4 06/24/2020 1050   PLT 240 06/24/2020 1050   MCV 92.8 06/24/2020 1050   MCH 29.1 06/24/2020 1050   MCHC 31.4 06/24/2020 1050   RDW 12.2 06/24/2020 1050   LYMPHSABS 2.1 06/24/2020 1050   MONOABS 0.5 06/24/2020 1050   EOSABS 0.3 06/24/2020 1050   BASOSABS 0.1 06/24/2020 1050   CMP Latest Ref Rng & Units 06/24/2020 12/26/2019 10/22/2019  Glucose 70 - 99 mg/dL 180(H) 129(H) 256(H)  BUN 8 - 23 mg/dL 14 16 19   Creatinine 0.44 - 1.00 mg/dL 0.56 0.53 0.58  Sodium 135 - 145 mmol/L 139 140 137  Potassium 3.5 - 5.1 mmol/L 4.4 4.7 4.8  Chloride 98 - 111 mmol/L 99 100 100  CO2 22 - 32 mmol/L 29 31 28   Calcium 8.9 - 10.3 mg/dL 9.2 9.3 9.0  Total Protein 6.5 - 8.1 g/dL 6.7 6.7 6.3(L)  Total Bilirubin 0.3 - 1.2 mg/dL 0.7 0.7 0.6  Alkaline Phos 38 - 126 U/L 74 78 74  AST 15 - 41 U/L 17 16 15   ALT 0 - 44 U/L 14 13 14     DIAGNOSTIC IMAGING:  I have independently reviewed the relevant imaging and discussed with  the patient.  ASSESSMENT:  Iron deficiency anemia -History of iron deficiency state from GI blood loss from ileus erosions.  EGD and colonoscopy in February 2018 showed benign gastric polyps, moderate gastritis, external and internal hemorrhoids. -She also has difficulty absorbing iron.  Previously on oral therapy with no improvement. -Denies any bleeding per rectum or melena. -Last Feraheme infusion was on 12/27/2019 and  01/03/2020, reports energy improvement after infusions  2.  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  Iron deficiency anemia -Secondary to chronic slow GI blood loss -Labs reviewed from 06/24/2020 reveal hemoglobin 13.0 with normal MCV, serum iron 70, saturation 27%, ferritin 299; vitamin B-12, folate, vitamin D within normal limits -Patient does not require IV iron infusion at this time -Repeat labs and return to clinic in 6 months for follow-up  PLAN SUMMARY & DISPOSITION: -Repeat labs and return to clinic in 6 months for follow-up  All questions were answered. The patient knows to call the clinic with any problems, questions or concerns.  Medical decision making: Low (1 stable chronic illness, review of prior labs, ordering follow-up labs)  Time spent on visit: I spent 20 minutes counseling the patient face to face. The total time spent in the appointment was 25 minutes and more than 50% was on counseling.   [name], PA-C  07/01/20 8:38 AM

## 2020-07-01 NOTE — Patient Instructions (Signed)
[place] at [facility] Discharge Instructions  You were seen today by Tarri Abernethy PA-C for your iron deficiency. Your iron and blood levels are normal. There is no need for IV iron today.     LABS: Return in 6 months for iron panel.   OTHER TESTS: None  MEDICATIONS: No changes  FOLLOW-UP APPOINTMENT: 6 months   Thank you for choosing [place] at [facility] to provide your oncology and hematology care.  To afford each patient quality time with our provider, please arrive at least 15 minutes before your scheduled appointment time.   If you have a lab appointment with the [facility] please come in thru the Main Entrance and check in at the main information desk.  You need to re-schedule your appointment should you arrive 10 or more minutes late.  We strive to give you quality time with our providers, and arriving late affects you and other patients whose appointments are after yours.  Also, if you no show three or more times for appointments you may be dismissed from the clinic at the providers discretion.     Again, thank you for choosing [facility].  Our hope is that these requests will decrease the amount of time that you wait before being seen by our physicians.       _____________________________________________________________  Should you have questions after your visit to [facility], please contact our office at [phone] and follow the prompts.  Our office hours are 8:00 a.m. and 4:30 p.m. Monday - Friday.  Please note that voicemails left after 4:00 p.m. may not be returned until the following business day.  We are closed weekends and major holidays.  You do have access to a nurse 24-7, just call the main number to the clinic [phone] and do not press any options, hold on the line and a nurse will answer the phone.    For prescription refill requests, have your pharmacy contact our office  and allow 72 hours.    Due to Covid, you will need to wear a mask upon entering the hospital. If you do not have a mask, a mask will be given to you at the Main Entrance upon arrival. For doctor visits, patients may have 1 support person age 39 or older with them. For treatment visits, patients can not have anyone with them due to social distancing guidelines and our immunocompromised population.

## 2020-07-03 DIAGNOSIS — D225 Melanocytic nevi of trunk: Secondary | ICD-10-CM | POA: Diagnosis not present

## 2020-07-03 DIAGNOSIS — Z08 Encounter for follow-up examination after completed treatment for malignant neoplasm: Secondary | ICD-10-CM | POA: Diagnosis not present

## 2020-07-03 DIAGNOSIS — Z1283 Encounter for screening for malignant neoplasm of skin: Secondary | ICD-10-CM | POA: Diagnosis not present

## 2020-07-03 DIAGNOSIS — Z85820 Personal history of malignant melanoma of skin: Secondary | ICD-10-CM | POA: Diagnosis not present

## 2020-07-09 DIAGNOSIS — I110 Hypertensive heart disease with heart failure: Secondary | ICD-10-CM | POA: Diagnosis not present

## 2020-07-09 DIAGNOSIS — I5032 Chronic diastolic (congestive) heart failure: Secondary | ICD-10-CM | POA: Diagnosis not present

## 2020-07-09 DIAGNOSIS — Z794 Long term (current) use of insulin: Secondary | ICD-10-CM | POA: Diagnosis not present

## 2020-07-09 DIAGNOSIS — E1165 Type 2 diabetes mellitus with hyperglycemia: Secondary | ICD-10-CM | POA: Diagnosis not present

## 2020-07-23 DIAGNOSIS — E782 Mixed hyperlipidemia: Secondary | ICD-10-CM | POA: Diagnosis not present

## 2020-07-23 DIAGNOSIS — I340 Nonrheumatic mitral (valve) insufficiency: Secondary | ICD-10-CM | POA: Diagnosis not present

## 2020-07-23 DIAGNOSIS — K219 Gastro-esophageal reflux disease without esophagitis: Secondary | ICD-10-CM | POA: Diagnosis not present

## 2020-07-23 DIAGNOSIS — E7849 Other hyperlipidemia: Secondary | ICD-10-CM | POA: Diagnosis not present

## 2020-07-23 DIAGNOSIS — E870 Hyperosmolality and hypernatremia: Secondary | ICD-10-CM | POA: Diagnosis not present

## 2020-07-23 DIAGNOSIS — E1165 Type 2 diabetes mellitus with hyperglycemia: Secondary | ICD-10-CM | POA: Diagnosis not present

## 2020-07-23 DIAGNOSIS — I10 Essential (primary) hypertension: Secondary | ICD-10-CM | POA: Diagnosis not present

## 2020-07-29 DIAGNOSIS — E1165 Type 2 diabetes mellitus with hyperglycemia: Secondary | ICD-10-CM | POA: Diagnosis not present

## 2020-07-29 DIAGNOSIS — I10 Essential (primary) hypertension: Secondary | ICD-10-CM | POA: Diagnosis not present

## 2020-07-29 DIAGNOSIS — I447 Left bundle-branch block, unspecified: Secondary | ICD-10-CM | POA: Diagnosis not present

## 2020-07-29 DIAGNOSIS — I471 Supraventricular tachycardia: Secondary | ICD-10-CM | POA: Diagnosis not present

## 2020-07-29 DIAGNOSIS — E870 Hyperosmolality and hypernatremia: Secondary | ICD-10-CM | POA: Diagnosis not present

## 2020-07-29 DIAGNOSIS — D500 Iron deficiency anemia secondary to blood loss (chronic): Secondary | ICD-10-CM | POA: Diagnosis not present

## 2020-07-29 DIAGNOSIS — E7849 Other hyperlipidemia: Secondary | ICD-10-CM | POA: Diagnosis not present

## 2020-07-31 DIAGNOSIS — Z1231 Encounter for screening mammogram for malignant neoplasm of breast: Secondary | ICD-10-CM | POA: Diagnosis not present

## 2020-08-02 ENCOUNTER — Other Ambulatory Visit: Payer: Self-pay | Admitting: Cardiology

## 2020-08-09 DIAGNOSIS — I129 Hypertensive chronic kidney disease with stage 1 through stage 4 chronic kidney disease, or unspecified chronic kidney disease: Secondary | ICD-10-CM | POA: Diagnosis not present

## 2020-08-09 DIAGNOSIS — E7849 Other hyperlipidemia: Secondary | ICD-10-CM | POA: Diagnosis not present

## 2020-08-09 DIAGNOSIS — N184 Chronic kidney disease, stage 4 (severe): Secondary | ICD-10-CM | POA: Diagnosis not present

## 2020-08-09 DIAGNOSIS — E1122 Type 2 diabetes mellitus with diabetic chronic kidney disease: Secondary | ICD-10-CM | POA: Diagnosis not present

## 2020-08-19 DIAGNOSIS — M85832 Other specified disorders of bone density and structure, left forearm: Secondary | ICD-10-CM | POA: Diagnosis not present

## 2020-08-19 DIAGNOSIS — M810 Age-related osteoporosis without current pathological fracture: Secondary | ICD-10-CM | POA: Diagnosis not present

## 2020-09-04 DIAGNOSIS — J209 Acute bronchitis, unspecified: Secondary | ICD-10-CM | POA: Diagnosis not present

## 2020-09-08 DIAGNOSIS — I110 Hypertensive heart disease with heart failure: Secondary | ICD-10-CM | POA: Diagnosis not present

## 2020-09-08 DIAGNOSIS — Z794 Long term (current) use of insulin: Secondary | ICD-10-CM | POA: Diagnosis not present

## 2020-09-08 DIAGNOSIS — I5032 Chronic diastolic (congestive) heart failure: Secondary | ICD-10-CM | POA: Diagnosis not present

## 2020-09-08 DIAGNOSIS — E1165 Type 2 diabetes mellitus with hyperglycemia: Secondary | ICD-10-CM | POA: Diagnosis not present

## 2020-09-11 ENCOUNTER — Other Ambulatory Visit: Payer: Self-pay

## 2020-09-11 MED ORDER — DILTIAZEM HCL ER COATED BEADS 240 MG PO CP24: 240.0000 mg | ORAL_CAPSULE | Freq: Every day | ORAL | 1 refills | Status: DC

## 2020-09-11 NOTE — Telephone Encounter (Signed)
Refilled diltiazem 240 mg qd to [facility]

## 2020-10-09 DIAGNOSIS — E1165 Type 2 diabetes mellitus with hyperglycemia: Secondary | ICD-10-CM | POA: Diagnosis not present

## 2020-10-09 DIAGNOSIS — I110 Hypertensive heart disease with heart failure: Secondary | ICD-10-CM | POA: Diagnosis not present

## 2020-10-09 DIAGNOSIS — Z794 Long term (current) use of insulin: Secondary | ICD-10-CM | POA: Diagnosis not present

## 2020-10-09 DIAGNOSIS — I5032 Chronic diastolic (congestive) heart failure: Secondary | ICD-10-CM | POA: Diagnosis not present

## 2020-11-09 DIAGNOSIS — I110 Hypertensive heart disease with heart failure: Secondary | ICD-10-CM | POA: Diagnosis not present

## 2020-11-09 DIAGNOSIS — E1165 Type 2 diabetes mellitus with hyperglycemia: Secondary | ICD-10-CM | POA: Diagnosis not present

## 2020-11-09 DIAGNOSIS — I5032 Chronic diastolic (congestive) heart failure: Secondary | ICD-10-CM | POA: Diagnosis not present

## 2020-11-09 DIAGNOSIS — Z794 Long term (current) use of insulin: Secondary | ICD-10-CM | POA: Diagnosis not present

## 2020-12-04 ENCOUNTER — Other Ambulatory Visit: Payer: Self-pay

## 2020-12-04 ENCOUNTER — Encounter (INDEPENDENT_AMBULATORY_CARE_PROVIDER_SITE_OTHER): Payer: Medicare Other | Admitting: Ophthalmology

## 2020-12-04 DIAGNOSIS — H353132 Nonexudative age-related macular degeneration, bilateral, intermediate dry stage: Secondary | ICD-10-CM | POA: Diagnosis not present

## 2020-12-04 DIAGNOSIS — D3131 Benign neoplasm of right choroid: Secondary | ICD-10-CM | POA: Diagnosis not present

## 2020-12-04 DIAGNOSIS — I10 Essential (primary) hypertension: Secondary | ICD-10-CM | POA: Diagnosis not present

## 2020-12-04 DIAGNOSIS — H35033 Hypertensive retinopathy, bilateral: Secondary | ICD-10-CM | POA: Diagnosis not present

## 2020-12-04 DIAGNOSIS — H43813 Vitreous degeneration, bilateral: Secondary | ICD-10-CM | POA: Diagnosis not present

## 2020-12-10 DIAGNOSIS — E1165 Type 2 diabetes mellitus with hyperglycemia: Secondary | ICD-10-CM | POA: Diagnosis not present

## 2020-12-10 DIAGNOSIS — I110 Hypertensive heart disease with heart failure: Secondary | ICD-10-CM | POA: Diagnosis not present

## 2020-12-10 DIAGNOSIS — I5032 Chronic diastolic (congestive) heart failure: Secondary | ICD-10-CM | POA: Diagnosis not present

## 2020-12-10 DIAGNOSIS — Z794 Long term (current) use of insulin: Secondary | ICD-10-CM | POA: Diagnosis not present

## 2021-01-13 ENCOUNTER — Other Ambulatory Visit: Payer: Self-pay

## 2021-01-13 ENCOUNTER — Inpatient Hospital Stay (HOSPITAL_COMMUNITY): Payer: Medicare Other | Attending: Hematology

## 2021-01-13 DIAGNOSIS — D500 Iron deficiency anemia secondary to blood loss (chronic): Secondary | ICD-10-CM

## 2021-01-13 DIAGNOSIS — D509 Iron deficiency anemia, unspecified: Secondary | ICD-10-CM | POA: Diagnosis not present

## 2021-01-13 LAB — CBC WITH DIFFERENTIAL/PLATELET
Abs Immature Granulocytes: 0.02 10*3/uL (ref 0.00–0.07)
Basophils Absolute: 0.1 10*3/uL (ref 0.0–0.1)
Basophils Relative: 1 %
Eosinophils Absolute: 0.2 10*3/uL (ref 0.0–0.5)
Eosinophils Relative: 2 %
HCT: 36.3 % (ref 36.0–46.0)
Hemoglobin: 12 g/dL (ref 12.0–15.0)
Immature Granulocytes: 0 %
Lymphocytes Relative: 29 %
Lymphs Abs: 2.3 10*3/uL (ref 0.7–4.0)
MCH: 31.3 pg (ref 26.0–34.0)
MCHC: 33.1 g/dL (ref 30.0–36.0)
MCV: 94.8 fL (ref 80.0–100.0)
Monocytes Absolute: 0.5 10*3/uL (ref 0.1–1.0)
Monocytes Relative: 6 %
Neutro Abs: 4.9 10*3/uL (ref 1.7–7.7)
Neutrophils Relative %: 62 %
Platelets: 255 10*3/uL (ref 150–400)
RBC: 3.83 MIL/uL — ABNORMAL LOW (ref 3.87–5.11)
RDW: 12.5 % (ref 11.5–15.5)
WBC: 7.9 10*3/uL (ref 4.0–10.5)
nRBC: 0 % (ref 0.0–0.2)

## 2021-01-13 LAB — COMPREHENSIVE METABOLIC PANEL
ALT: 15 U/L (ref 0–44)
AST: 17 U/L (ref 15–41)
Albumin: 3.7 g/dL (ref 3.5–5.0)
Alkaline Phosphatase: 73 U/L (ref 38–126)
Anion gap: 8 (ref 5–15)
BUN: 17 mg/dL (ref 8–23)
CO2: 29 mmol/L (ref 22–32)
Calcium: 9.1 mg/dL (ref 8.9–10.3)
Chloride: 102 mmol/L (ref 98–111)
Creatinine, Ser: 0.62 mg/dL (ref 0.44–1.00)
GFR, Estimated: >60 mL/min (ref >60)
Glucose, Bld: 241 mg/dL — ABNORMAL HIGH (ref 70–99)
Potassium: 4.7 mmol/L (ref 3.5–5.1)
Sodium: 139 mmol/L (ref 135–145)
Total Bilirubin: 0.6 mg/dL (ref 0.3–1.2)
Total Protein: 6.3 g/dL — ABNORMAL LOW (ref 6.5–8.1)

## 2021-01-13 LAB — VITAMIN D 25 HYDROXY (VIT D DEFICIENCY, FRACTURES): Vit D, 25-Hydroxy: 66.44 ng/mL (ref 30–100)

## 2021-01-13 LAB — VITAMIN B12: Vitamin B-12: 456 pg/mL (ref 180–914)

## 2021-01-13 LAB — IRON AND TIBC
Iron: 65 ug/dL (ref 28–170)
Saturation Ratios: 27 % (ref 10.4–31.8)
TIBC: 243 ug/dL — ABNORMAL LOW (ref 250–450)
UIBC: 178 ug/dL

## 2021-01-13 LAB — FERRITIN: Ferritin: 322 ng/mL — ABNORMAL HIGH (ref 11–307)

## 2021-01-13 LAB — FOLATE: Folate: 26.7 ng/mL (ref >5.9)

## 2021-01-13 LAB — LACTATE DEHYDROGENASE: LDH: 79 U/L — ABNORMAL LOW (ref 98–192)

## 2021-01-19 NOTE — Progress Notes (Signed)
[place], [place] [address]   CLINIC:  Medical Oncology/Hematology  PCP:  Curlene Labrum, MD [place] [address] [phone]   REASON FOR VISIT:  Follow-up for iron deficiency anemia  CURRENT THERAPY: Intermittent IV iron infusions  INTERVAL HISTORY:  Kristina Berry 75 y.o. female returns for routine follow-up of her iron deficiency anemia.  She was last seen in clinic on 07/01/2020 by Tarri Abernethy PA-C.  She has a history of iron deficiency state from GI blood loss likely related to erosions in the ileum as found on capsule study in March 2018.  At today's visit, she reports feeling fairly well.  No recent hospitalizations, surgeries, or changes in baseline health status.  She denies any major bleeding events such as hematemesis, hematochezia, melena, or epistaxis.  She does report that she has occasional dark bowel movements when she is constipated, but denies any obvious melena.  She denies any unusual fatigue, pica, restless leg symptoms, chest pain, dyspnea on exertion, dizziness, or syncope.  She continues to take aspirin and does occasionally use NSAIDs such as Aleve.  She continues to take vitamin D3, magnesium, vitamin C, and zinc.  She has 80% energy and 40% appetite. She endorses that she is maintaining a stable weight.    REVIEW OF SYSTEMS:  Review of Systems  Constitutional:  Negative for appetite change, chills, diaphoresis, fatigue (energy 80%, at baseline), fever and unexpected weight change.  HENT:   Negative for lump/mass and nosebleeds.   Eyes:  Negative for eye problems.  Respiratory:  Negative for cough, hemoptysis and shortness of breath.   Cardiovascular:  Negative for chest pain, leg swelling and palpitations.  Gastrointestinal:  Positive for diarrhea and nausea. Negative for abdominal pain, blood in stool, constipation and vomiting.  Genitourinary:  Negative for hematuria.   Skin: Negative.    Neurological:  Negative for dizziness, headaches and light-headedness.  Hematological:  Does not bruise/bleed easily.  Psychiatric/Behavioral:  The patient is nervous/anxious.      PAST MEDICAL/SURGICAL HISTORY:  Past Medical History:  Diagnosis Date   Anemia    Anxiety    Baker's cyst of knee    Chronic low back pain    Diastolic dysfunction    Essential hypertension    Family history of breast cancer    Family history of melanoma    Family history of stomach cancer    Family history of uterine cancer    GERD (gastroesophageal reflux disease)    Hyperlipidemia    Iron deficiency anemia 05/18/2016   LBBB (left bundle branch block)    Melanoma (HCC)    Mitral regurgitation    Mild to moderate 2011   Skin cancer    basal cell removed from face/melanoma removed from rt arm   Type 2 diabetes mellitus [facility])    Past Surgical History:  Procedure Laterality Date   ADENOIDECTOMY     BACK SURGERY  05/2014   BIOPSY  05/28/2016   Procedure: BIOPSY;  Surgeon: Danie Binder, MD;  Location: [facility];  Service: Endoscopy;;  duodenal and gastric   CHOLECYSTECTOMY  2009   COLONOSCOPY N/A 05/28/2016   Dr. Oneida Alar: one 8 mm tubular adenomas, diverticulosis in recto-sigmoid, sigmoid, and descending. Colonoscopy in 5-10 years if benefits outweigh the risks   ESOPHAGOGASTRODUODENOSCOPY N/A 05/28/2016   Dr. Oneida Alar: multiple benign-appearing gastric polyps, gastritis   GIVENS CAPSULE STUDY N/A 06/23/2016   Dr. Oneida Alar: mild ileitis  MELANOMA SURGERY Right    POLYPECTOMY  05/28/2016   Procedure: POLYPECTOMY;  Surgeon: Danie Binder, MD;  Location: [facility];  Service: Endoscopy;;  ascending colon    SKIN CANCER EXCISION     Basal cell removal from left side of face   TONSILLECTOMY       SOCIAL HISTORY:  Social History   Socioeconomic History   Marital status: Married    Spouse name: Not on file   Number of children: Not on file   Years of education: Not on file   Highest  education level: Not on file  Occupational History   Not on file  Tobacco Use   Smoking status: Never   Smokeless tobacco: Never  Vaping Use   Vaping Use: Never used  Substance and Sexual Activity   Alcohol use: No    Alcohol/week: 0.0 standard drinks   Drug use: No   Sexual activity: Not on file    Comment: married  Other Topics Concern   Not on file  Social History Narrative   Not on file   Social Determinants of Health   Financial Resource Strain: Not on file  Food Insecurity: Not on file  Transportation Needs: Not on file  Physical Activity: Not on file  Stress: Not on file  Social Connections: Not on file  Intimate Partner Violence: Not on file    FAMILY HISTORY:  Family History  Problem Relation Age of Onset   Osteoporosis Mother    Dementia Mother    Diabetes Mother    Melanoma Mother        dx in her 53s   Breast cancer Sister 56       triple negative   Heart Problems Father    Diabetes Father    Breast cancer Sister 31   Learning disabilities Sister    COPD Sister    Lung cancer Sister    Breast cancer Maternal Aunt 48   Stomach cancer Maternal Uncle    Uterine cancer Other 35   Colon cancer Neg Hx     CURRENT MEDICATIONS:  Outpatient Encounter Medications as of 01/20/2021  Medication Sig   aspirin 81 MG tablet Take 1 tablet by mouth daily.   atorvastatin (LIPITOR) 20 MG tablet Take 1 tablet by mouth daily.   Black Pepper-Turmeric (TURMERIC CURCUMIN) 08-998 MG CAPS Take 1 capsule by mouth every other day.   Cholecalciferol (VITAMIN D3) 2000 units TABS Take 1 tablet by mouth daily.   cyclobenzaprine (FLEXERIL) 5 MG tablet Take 5 mg by mouth at bedtime as needed.   diltiazem (CARDIZEM CD) 240 MG 24 hr capsule Take 1 capsule (240 mg total) by mouth daily.   fluticasone (FLONASE) 50 MCG/ACT nasal spray Place 2 sprays into both nostrils as needed for allergies or rhinitis.   furosemide (LASIX) 40 MG tablet Take 40 mg by mouth 2 (two) times daily.     irbesartan (AVAPRO) 150 MG tablet Take 1 tablet by mouth daily.   ketoconazole (NIZORAL) 2 % cream as needed.    LANTUS SOLOSTAR 100 UNIT/ML Solostar Pen Inject 20 Units into the skin at bedtime.   loratadine (CLARITIN) 10 MG tablet Take 10 mg by mouth daily.   metFORMIN (GLUCOPHAGE) 1000 MG tablet Take 1 tablet by mouth 2 (two) times daily.   omeprazole (PRILOSEC) 20 MG capsule Take 1 capsule by mouth daily.   PARoxetine (PAXIL) 10 MG tablet Take 0.5 tablets by mouth daily.   PROAIR HFA 108 (90 Base)  MCG/ACT inhaler INHALE 1 TO 2 PUFFS BY MOUTH EVERY 4 TO 6 HOURS AS NEEDED FOR SHORTNESS OF BREATH OR WHEEZING   sitaGLIPtin (JANUVIA) 100 MG tablet Take 1 tablet by mouth daily.   No facility-administered encounter medications on file as of 01/20/2021.    ALLERGIES:  Allergies  Allergen Reactions   Cefdinir Other (See Comments)    Stomach cramps   Nsaids Other (See Comments)    Stomach pain   Sulfa Antibiotics Nausea Only     PHYSICAL EXAM:  ECOG PERFORMANCE STATUS: 1 - Symptomatic but completely ambulatory  There were no vitals filed for this visit. There were no vitals filed for this visit. Physical Exam Constitutional:      Appearance: Normal appearance. She is obese.  HENT:     Head: Normocephalic and atraumatic.     Mouth/Throat:     Mouth: Mucous membranes are moist.  Eyes:     Extraocular Movements: Extraocular movements intact.     Pupils: Pupils are equal, round, and reactive to light.  Cardiovascular:     Rate and Rhythm: Normal rate and regular rhythm.     Pulses: Normal pulses.     Heart sounds: Normal heart sounds.  Pulmonary:     Effort: Pulmonary effort is normal.     Breath sounds: Normal breath sounds.  Abdominal:     General: Bowel sounds are normal.     Palpations: Abdomen is soft.     Tenderness: There is no abdominal tenderness.  Musculoskeletal:        General: No swelling.     Right lower leg: No edema.     Left lower leg: No edema.   Lymphadenopathy:     Cervical: No cervical adenopathy.  Skin:    General: Skin is warm and dry.  Neurological:     General: No focal deficit present.     Mental Status: She is alert and oriented to person, place, and time.  Psychiatric:        Mood and Affect: Mood normal.        Behavior: Behavior normal.     LABORATORY DATA:  I have reviewed the labs as listed.  CBC    Component Value Date/Time   WBC 7.9 01/13/2021 0949   RBC 3.83 (L) 01/13/2021 0949   HGB 12.0 01/13/2021 0949   HCT 36.3 01/13/2021 0949   PLT 255 01/13/2021 0949   MCV 94.8 01/13/2021 0949   MCH 31.3 01/13/2021 0949   MCHC 33.1 01/13/2021 0949   RDW 12.5 01/13/2021 0949   LYMPHSABS 2.3 01/13/2021 0949   MONOABS 0.5 01/13/2021 0949   EOSABS 0.2 01/13/2021 0949   BASOSABS 0.1 01/13/2021 0949   CMP Latest Ref Rng & Units 01/13/2021 06/24/2020 12/26/2019  Glucose 70 - 99 mg/dL 241(H) 180(H) 129(H)  BUN 8 - 23 mg/dL 17 14 16   Creatinine 0.44 - 1.00 mg/dL 0.62 0.56 0.53  Sodium 135 - 145 mmol/L 139 139 140  Potassium 3.5 - 5.1 mmol/L 4.7 4.4 4.7  Chloride 98 - 111 mmol/L 102 99 100  CO2 22 - 32 mmol/L 29 29 31   Calcium 8.9 - 10.3 mg/dL 9.1 9.2 9.3  Total Protein 6.5 - 8.1 g/dL 6.3(L) 6.7 6.7  Total Bilirubin 0.3 - 1.2 mg/dL 0.6 0.7 0.7  Alkaline Phos 38 - 126 U/L 73 74 78  AST 15 - 41 U/L 17 17 16   ALT 0 - 44 U/L 15 14 13     DIAGNOSTIC IMAGING:  I have  independently reviewed the relevant imaging and discussed with the patient.  ASSESSMENT & PLAN: 1.  Iron deficiency anemia - History of GI blood loss secondary to erosions in the ileum - EGD/colonoscopy (05/28/2016) showed moderate gastritis, benign gastric polyps, diverticulosis, and external/internal hemorrhoids. - Capsule video endoscopy (06/23/2016): Occasional erosions in the ileum - Patient also has difficulty absorbing iron.  Previously she was on oral iron therapy with no improvement. - Her last IV Feraheme infusion was on 12/27/2019 and  01/03/2020, reported energy improvement after infusions -No bright red blood per rectum or melena - Most recent labs (01/13/2021): Hgb 12.0 with MCV 94.8; iron saturation 27% with decreased TIBC 243, elevated ferritin 322 - PLAN: No indication for IV iron at this time.  Patient counseled to avoid NSAIDs.  Repeat labs with phone visit in 6 months.  2.  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 The patient knows to call the clinic with any problems, questions or concerns.  Medical decision making: Low  Time spent on visit: I spent 20 minutes counseling the patient face to face. The total time spent in the appointment was 30 minutes and more than 50% was on counseling.   [name], PA-C  01/20/2021 12:09 PM

## 2021-01-20 ENCOUNTER — Inpatient Hospital Stay (HOSPITAL_COMMUNITY): Payer: Medicare Other | Admitting: Physician Assistant

## 2021-01-20 ENCOUNTER — Other Ambulatory Visit: Payer: Self-pay

## 2021-01-20 VITALS — BP 126/54 | HR 78 | Temp 96.7°F | Resp 20 | Wt 188.7 lb

## 2021-01-20 DIAGNOSIS — D500 Iron deficiency anemia secondary to blood loss (chronic): Secondary | ICD-10-CM

## 2021-01-20 DIAGNOSIS — D509 Iron deficiency anemia, unspecified: Secondary | ICD-10-CM | POA: Diagnosis not present

## 2021-01-20 NOTE — Patient Instructions (Signed)
[place] at [facility] Discharge Instructions  You were seen today by Tarri Abernethy PA-C for your iron deficiency anemia.  Your blood and iron levels were within normal range at this visit, so you do not need any IV iron at this time.  We will check your blood counts and iron panel again in 6 months.  We will discuss these results via a phone visit in 6 months to see if you need any additional iron at that time.     Thank you for choosing [place] at [facility] to provide your oncology and hematology care.  To afford each patient quality time with our provider, please arrive at least 15 minutes before your scheduled appointment time.   If you have a lab appointment with the [facility] please come in thru the Main Entrance and check in at the main information desk.  You need to re-schedule your appointment should you arrive 10 or more minutes late.  We strive to give you quality time with our providers, and arriving late affects you and other patients whose appointments are after yours.  Also, if you no show three or more times for appointments you may be dismissed from the clinic at the providers discretion.     Again, thank you for choosing [facility].  Our hope is that these requests will decrease the amount of time that you wait before being seen by our physicians.       _____________________________________________________________  Should you have questions after your visit to [facility], please contact our office at [phone] and follow the prompts.  Our office hours are 8:00 a.m. and 4:30 p.m. Monday - Friday.  Please note that voicemails left after 4:00 p.m. may not be returned until the following business day.  We are closed weekends and major holidays.  You do have access to a nurse 24-7, just call the main number to the clinic [phone] and do not press any options, hold on the line and a  nurse will answer the phone.    For prescription refill requests, have your pharmacy contact our office and allow 72 hours.    Due to Covid, you will need to wear a mask upon entering the hospital. If you do not have a mask, a mask will be given to you at the Main Entrance upon arrival. For doctor visits, patients may have 1 support person age 58 or older with them. For treatment visits, patients can not have anyone with them due to social distancing guidelines and our immunocompromised population.

## 2021-01-30 DIAGNOSIS — E1165 Type 2 diabetes mellitus with hyperglycemia: Secondary | ICD-10-CM | POA: Diagnosis not present

## 2021-01-30 DIAGNOSIS — E7849 Other hyperlipidemia: Secondary | ICD-10-CM | POA: Diagnosis not present

## 2021-01-30 DIAGNOSIS — I340 Nonrheumatic mitral (valve) insufficiency: Secondary | ICD-10-CM | POA: Diagnosis not present

## 2021-01-30 DIAGNOSIS — Z1329 Encounter for screening for other suspected endocrine disorder: Secondary | ICD-10-CM | POA: Diagnosis not present

## 2021-01-30 DIAGNOSIS — I10 Essential (primary) hypertension: Secondary | ICD-10-CM | POA: Diagnosis not present

## 2021-01-30 DIAGNOSIS — E782 Mixed hyperlipidemia: Secondary | ICD-10-CM | POA: Diagnosis not present

## 2021-01-30 DIAGNOSIS — E870 Hyperosmolality and hypernatremia: Secondary | ICD-10-CM | POA: Diagnosis not present

## 2021-02-02 DIAGNOSIS — I10 Essential (primary) hypertension: Secondary | ICD-10-CM | POA: Diagnosis not present

## 2021-02-02 DIAGNOSIS — E1165 Type 2 diabetes mellitus with hyperglycemia: Secondary | ICD-10-CM | POA: Diagnosis not present

## 2021-02-02 DIAGNOSIS — I471 Supraventricular tachycardia: Secondary | ICD-10-CM | POA: Diagnosis not present

## 2021-02-02 DIAGNOSIS — Z23 Encounter for immunization: Secondary | ICD-10-CM | POA: Diagnosis not present

## 2021-02-02 DIAGNOSIS — Z0001 Encounter for general adult medical examination with abnormal findings: Secondary | ICD-10-CM | POA: Diagnosis not present

## 2021-02-02 DIAGNOSIS — I447 Left bundle-branch block, unspecified: Secondary | ICD-10-CM | POA: Diagnosis not present

## 2021-02-02 DIAGNOSIS — E870 Hyperosmolality and hypernatremia: Secondary | ICD-10-CM | POA: Diagnosis not present

## 2021-02-02 DIAGNOSIS — D500 Iron deficiency anemia secondary to blood loss (chronic): Secondary | ICD-10-CM | POA: Diagnosis not present

## 2021-02-09 DIAGNOSIS — Z794 Long term (current) use of insulin: Secondary | ICD-10-CM | POA: Diagnosis not present

## 2021-02-09 DIAGNOSIS — E1165 Type 2 diabetes mellitus with hyperglycemia: Secondary | ICD-10-CM | POA: Diagnosis not present

## 2021-02-09 DIAGNOSIS — I5032 Chronic diastolic (congestive) heart failure: Secondary | ICD-10-CM | POA: Diagnosis not present

## 2021-02-09 DIAGNOSIS — I110 Hypertensive heart disease with heart failure: Secondary | ICD-10-CM | POA: Diagnosis not present

## 2021-03-11 DIAGNOSIS — E1165 Type 2 diabetes mellitus with hyperglycemia: Secondary | ICD-10-CM | POA: Diagnosis not present

## 2021-03-11 DIAGNOSIS — Z794 Long term (current) use of insulin: Secondary | ICD-10-CM | POA: Diagnosis not present

## 2021-03-11 DIAGNOSIS — I110 Hypertensive heart disease with heart failure: Secondary | ICD-10-CM | POA: Diagnosis not present

## 2021-03-11 DIAGNOSIS — I5032 Chronic diastolic (congestive) heart failure: Secondary | ICD-10-CM | POA: Diagnosis not present

## 2021-03-12 ENCOUNTER — Other Ambulatory Visit: Payer: Self-pay | Admitting: Physician Assistant

## 2021-03-12 NOTE — Telephone Encounter (Signed)
This is a [place] pt, Dr. McDowell 

## 2021-04-10 DIAGNOSIS — E1165 Type 2 diabetes mellitus with hyperglycemia: Secondary | ICD-10-CM | POA: Diagnosis not present

## 2021-04-10 DIAGNOSIS — Z794 Long term (current) use of insulin: Secondary | ICD-10-CM | POA: Diagnosis not present

## 2021-04-10 DIAGNOSIS — I110 Hypertensive heart disease with heart failure: Secondary | ICD-10-CM | POA: Diagnosis not present

## 2021-04-10 DIAGNOSIS — I5032 Chronic diastolic (congestive) heart failure: Secondary | ICD-10-CM | POA: Diagnosis not present

## 2021-04-30 DIAGNOSIS — E1165 Type 2 diabetes mellitus with hyperglycemia: Secondary | ICD-10-CM | POA: Diagnosis not present

## 2021-04-30 DIAGNOSIS — D649 Anemia, unspecified: Secondary | ICD-10-CM | POA: Diagnosis not present

## 2021-04-30 DIAGNOSIS — E875 Hyperkalemia: Secondary | ICD-10-CM | POA: Diagnosis not present

## 2021-05-04 DIAGNOSIS — I10 Essential (primary) hypertension: Secondary | ICD-10-CM | POA: Diagnosis not present

## 2021-05-04 DIAGNOSIS — I471 Supraventricular tachycardia: Secondary | ICD-10-CM | POA: Diagnosis not present

## 2021-05-04 DIAGNOSIS — M25552 Pain in left hip: Secondary | ICD-10-CM | POA: Diagnosis not present

## 2021-05-04 DIAGNOSIS — E870 Hyperosmolality and hypernatremia: Secondary | ICD-10-CM | POA: Diagnosis not present

## 2021-05-04 DIAGNOSIS — E7849 Other hyperlipidemia: Secondary | ICD-10-CM | POA: Diagnosis not present

## 2021-05-04 DIAGNOSIS — E1165 Type 2 diabetes mellitus with hyperglycemia: Secondary | ICD-10-CM | POA: Diagnosis not present

## 2021-05-25 ENCOUNTER — Encounter: Payer: Self-pay | Admitting: *Deleted

## 2021-06-11 ENCOUNTER — Encounter: Payer: Self-pay | Admitting: Gastroenterology

## 2021-07-14 ENCOUNTER — Encounter: Payer: Self-pay | Admitting: Internal Medicine

## 2021-07-21 ENCOUNTER — Inpatient Hospital Stay (HOSPITAL_COMMUNITY): Payer: Medicare Other | Attending: Hematology

## 2021-07-23 DIAGNOSIS — Z85820 Personal history of malignant melanoma of skin: Secondary | ICD-10-CM | POA: Diagnosis not present

## 2021-07-23 DIAGNOSIS — K219 Gastro-esophageal reflux disease without esophagitis: Secondary | ICD-10-CM | POA: Diagnosis not present

## 2021-07-23 DIAGNOSIS — Z08 Encounter for follow-up examination after completed treatment for malignant neoplasm: Secondary | ICD-10-CM | POA: Diagnosis not present

## 2021-07-23 DIAGNOSIS — X32XXXD Exposure to sunlight, subsequent encounter: Secondary | ICD-10-CM | POA: Diagnosis not present

## 2021-07-23 DIAGNOSIS — E559 Vitamin D deficiency, unspecified: Secondary | ICD-10-CM | POA: Diagnosis not present

## 2021-07-23 DIAGNOSIS — D500 Iron deficiency anemia secondary to blood loss (chronic): Secondary | ICD-10-CM | POA: Diagnosis not present

## 2021-07-23 DIAGNOSIS — L570 Actinic keratosis: Secondary | ICD-10-CM | POA: Diagnosis not present

## 2021-07-23 DIAGNOSIS — E1165 Type 2 diabetes mellitus with hyperglycemia: Secondary | ICD-10-CM | POA: Diagnosis not present

## 2021-07-23 DIAGNOSIS — Z1283 Encounter for screening for malignant neoplasm of skin: Secondary | ICD-10-CM | POA: Diagnosis not present

## 2021-07-23 DIAGNOSIS — E7849 Other hyperlipidemia: Secondary | ICD-10-CM | POA: Diagnosis not present

## 2021-07-23 DIAGNOSIS — E782 Mixed hyperlipidemia: Secondary | ICD-10-CM | POA: Diagnosis not present

## 2021-07-23 DIAGNOSIS — I10 Essential (primary) hypertension: Secondary | ICD-10-CM | POA: Diagnosis not present

## 2021-07-23 DIAGNOSIS — D225 Melanocytic nevi of trunk: Secondary | ICD-10-CM | POA: Diagnosis not present

## 2021-07-23 DIAGNOSIS — D509 Iron deficiency anemia, unspecified: Secondary | ICD-10-CM | POA: Diagnosis not present

## 2021-07-28 ENCOUNTER — Telehealth (HOSPITAL_COMMUNITY): Payer: Medicare Other | Admitting: Physician Assistant

## 2021-07-28 DIAGNOSIS — E7849 Other hyperlipidemia: Secondary | ICD-10-CM | POA: Diagnosis not present

## 2021-07-28 DIAGNOSIS — I471 Supraventricular tachycardia: Secondary | ICD-10-CM | POA: Diagnosis not present

## 2021-07-28 DIAGNOSIS — R011 Cardiac murmur, unspecified: Secondary | ICD-10-CM | POA: Diagnosis not present

## 2021-07-28 DIAGNOSIS — D500 Iron deficiency anemia secondary to blood loss (chronic): Secondary | ICD-10-CM | POA: Diagnosis not present

## 2021-07-28 DIAGNOSIS — I10 Essential (primary) hypertension: Secondary | ICD-10-CM | POA: Diagnosis not present

## 2021-07-28 DIAGNOSIS — E1165 Type 2 diabetes mellitus with hyperglycemia: Secondary | ICD-10-CM | POA: Diagnosis not present

## 2021-07-28 DIAGNOSIS — E870 Hyperosmolality and hypernatremia: Secondary | ICD-10-CM | POA: Diagnosis not present

## 2021-08-09 DIAGNOSIS — Z794 Long term (current) use of insulin: Secondary | ICD-10-CM | POA: Diagnosis not present

## 2021-08-09 DIAGNOSIS — I5032 Chronic diastolic (congestive) heart failure: Secondary | ICD-10-CM | POA: Diagnosis not present

## 2021-08-09 DIAGNOSIS — I110 Hypertensive heart disease with heart failure: Secondary | ICD-10-CM | POA: Diagnosis not present

## 2021-08-09 DIAGNOSIS — E1165 Type 2 diabetes mellitus with hyperglycemia: Secondary | ICD-10-CM | POA: Diagnosis not present

## 2021-08-25 ENCOUNTER — Inpatient Hospital Stay (HOSPITAL_COMMUNITY): Payer: Medicare Other | Attending: Hematology

## 2021-08-25 DIAGNOSIS — K289 Gastrojejunal ulcer, unspecified as acute or chronic, without hemorrhage or perforation: Secondary | ICD-10-CM | POA: Diagnosis not present

## 2021-08-25 DIAGNOSIS — D500 Iron deficiency anemia secondary to blood loss (chronic): Secondary | ICD-10-CM | POA: Insufficient documentation

## 2021-08-25 LAB — CBC WITH DIFFERENTIAL/PLATELET
Abs Immature Granulocytes: 0.02 10*3/uL (ref 0.00–0.07)
Basophils Absolute: 0.1 10*3/uL (ref 0.0–0.1)
Basophils Relative: 1 %
Eosinophils Absolute: 0.2 10*3/uL (ref 0.0–0.5)
Eosinophils Relative: 3 %
HCT: 38.2 % (ref 36.0–46.0)
Hemoglobin: 12.1 g/dL (ref 12.0–15.0)
Immature Granulocytes: 0 %
Lymphocytes Relative: 27 %
Lymphs Abs: 2.1 10*3/uL (ref 0.7–4.0)
MCH: 28.9 pg (ref 26.0–34.0)
MCHC: 31.7 g/dL (ref 30.0–36.0)
MCV: 91.2 fL (ref 80.0–100.0)
Monocytes Absolute: 0.5 10*3/uL (ref 0.1–1.0)
Monocytes Relative: 6 %
Neutro Abs: 4.8 10*3/uL (ref 1.7–7.7)
Neutrophils Relative %: 63 %
Platelets: 252 10*3/uL (ref 150–400)
RBC: 4.19 MIL/uL (ref 3.87–5.11)
RDW: 12.8 % (ref 11.5–15.5)
WBC: 7.6 10*3/uL (ref 4.0–10.5)
nRBC: 0 % (ref 0.0–0.2)

## 2021-08-25 LAB — FERRITIN: Ferritin: 239 ng/mL (ref 11–307)

## 2021-08-25 LAB — IRON AND TIBC
Iron: 58 ug/dL (ref 28–170)
Saturation Ratios: 21 % (ref 10.4–31.8)
TIBC: 277 ug/dL (ref 250–450)
UIBC: 219 ug/dL

## 2021-08-31 NOTE — Progress Notes (Incomplete)
Virtual Visit via Telephone Note [facility]  I connected with Kristina Berry  on *** at  *** by telephone and verified that I am speaking with the correct person using two identifiers.  Location: Patient: Home Provider: [facility]   I discussed the limitations, risks, security and privacy concerns of performing an evaluation and management service by telephone and the availability of in person appointments. I also discussed with the patient that there may be a patient responsible charge related to this service. The patient expressed understanding and agreed to proceed.  REASON FOR VISIT:  Follow-up for iron deficiency anemia   CURRENT THERAPY: Intermittent IV iron infusions  INTERVAL HISTORY: Kristina Berry is contacted today for follow-up of her iron deficiency anemia.  She was last evaluated via telemedicine visit with Tarri Abernethy PA-C on 01/20/2021.  She has a history of iron deficiency state from GI blood loss likely related to erosions in the ileum as found on capsule study in March 2018.  At today's visit, she reports feeling ***.  No recent hospitalizations, surgeries, or changes in baseline health status.   *** She denies any major bleeding events such as hematemesis, hematochezia, melena, or epistaxis.  She does report that she has occasional dark bowel movements when she is constipated, but denies any obvious melena.   *** She denies any unusual fatigue, pica, restless leg symptoms, chest pain, dyspnea on exertion, dizziness, or syncope.  *** She continues to take aspirin and does occasionally use NSAIDs such as Aleve.  She continues to take vitamin D3, magnesium, vitamin C, and zinc.  *** She has ***% energy and ***% appetite. She endorses that she is maintaining a stable weight.    OBSERVATIONS/OBJECTIVE: ROS ***  PHYSICAL EXAM (per limitations of virtual telephone visit): The patient is alert and oriented x 3, exhibiting  adequate mentation, good mood, and ability to speak in full sentences and execute sound judgement.   ASSESSMENT & PLAN: 1.  Iron deficiency anemia - History of GI blood loss secondary to erosions in the ileum - EGD/colonoscopy (05/28/2016) showed moderate gastritis, benign gastric polyps, diverticulosis, and external/internal hemorrhoids. - Capsule video endoscopy (06/23/2016): Occasional erosions in the ileum - Patient also has difficulty absorbing iron.  Previously she was on oral iron therapy with no improvement. - Her last IV Feraheme infusion was on 12/27/2019 and 01/03/2020, reported energy improvement after infusions - No bright red blood per rectum or melena *** - Symptoms *** - Most recent labs (08/25/2021): Hgb 12.1/MCV 91.2, ferritin 239, iron saturation 21% - PLAN: No indication for IV iron at this time.  Patient counseled to avoid NSAIDs. - Patient's labs have been stable for 2+ years, therefore we will switch to annual visits at this time.  She is aware of alarm symptoms that would prompt more immediate follow-up. - Repeat labs and RTC in 1 year.   2.  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 The patient was provided an opportunity to  ask questions and all were answered. The patient agreed with the plan and demonstrated an understanding of the instructions.   The patient was advised to call back or seek an in-person evaluation if the symptoms worsen or if the condition fails to improve as anticipated.  I provided *** minutes of non-face-to-face time during this encounter.   [name], PA-C ***

## 2021-09-01 ENCOUNTER — Inpatient Hospital Stay (HOSPITAL_COMMUNITY): Payer: Medicare Other | Admitting: Physician Assistant

## 2021-09-04 DIAGNOSIS — Z1231 Encounter for screening mammogram for malignant neoplasm of breast: Secondary | ICD-10-CM | POA: Diagnosis not present

## 2021-09-04 DIAGNOSIS — D126 Benign neoplasm of colon, unspecified: Secondary | ICD-10-CM | POA: Diagnosis not present

## 2021-09-04 DIAGNOSIS — R300 Dysuria: Secondary | ICD-10-CM | POA: Diagnosis not present

## 2021-09-10 NOTE — Progress Notes (Unsigned)
Virtual Visit via Telephone Note [facility]  I connected with Kristina Berry  on 09/11/21 at  3:19 PM by telephone and verified that I am speaking with the correct person using two identifiers.  Location: Patient: Home Provider: [facility]   I discussed the limitations, risks, security and privacy concerns of performing an evaluation and management service by telephone and the availability of in person appointments. I also discussed with the patient that there may be a patient responsible charge related to this service. The patient expressed understanding and agreed to proceed.  REASON FOR VISIT: Follow-up for iron deficiency anemia   CURRENT THERAPY: Intermittent IV iron infusions   INTERVAL HISTORY: Ms. Kristina Berry is contacted today for follow-up of her iron deficiency anemia.  She was last evaluated via telemedicine visit with Tarri Abernethy PA-C on 01/20/2021.  She has a history of iron deficiency state from GI blood loss likely related to erosions in the ileum as found on capsule study in March 2018.   At today's visit, she reports feeling well.  No recent hospitalizations, surgeries, or changes in baseline health status.   She denies any major bleeding events such as hematemesis, hematochezia, melena, or epistaxis.   She denies any unusual fatigue, pica, restless leg symptoms, chest pain, dyspnea on exertion, dizziness, or syncope.  She continues to take vitamin D3, magnesium, vitamin C, and zinc.  She has 75% energy and 100% appetite. She endorses that she is maintaining a stable weight.    OBSERVATIONS/OBJECTIVE: Review of Systems  Constitutional:  Positive for malaise/fatigue (intermittent fatigue). Negative for chills, diaphoresis, fever and weight loss.  Respiratory:  Negative for cough and shortness of breath.   Cardiovascular:  Negative for chest pain and palpitations.  Gastrointestinal:  Negative for abdominal pain, blood in  stool, melena, nausea and vomiting.  Neurological:  Negative for dizziness and headaches.    PHYSICAL EXAM (per limitations of virtual telephone visit): The patient is alert and oriented x 3, exhibiting adequate mentation, good mood, and ability to speak in full sentences and execute sound judgement.   ASSESSMENT & PLAN: 1.  Iron deficiency anemia - History of GI blood loss secondary to erosions in the ileum - EGD/colonoscopy (05/28/2016) showed moderate gastritis, benign gastric polyps, diverticulosis, and external/internal hemorrhoids. - Capsule video endoscopy (06/23/2016): Occasional erosions in the ileum - Patient also has difficulty absorbing iron.  Previously she was on oral iron therapy with no improvement. - Her last IV Feraheme infusion was on 12/27/2019 and 01/03/2020, reported energy improvement after infusions - No bright red blood per rectum or melena  - Most recent labs (08/25/2021): Hgb 12.1/MCV 91.2, ferritin 239, iron saturation 21% - PLAN: No indication for IV iron at this time.  Patient counseled to avoid NSAIDs. - Patient's labs have been stable for 2+ years, therefore we will switch to annual visits at this time.  She is aware of alarm symptoms that would prompt more immediate follow-up. - Repeat labs and RTC in 1 year.   2.  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 The patient was provided an opportunity to ask questions and all were answered. The patient agreed with the plan and demonstrated an understanding of the instructions.   The patient was advised to call back or seek an in-person evaluation if the symptoms worsen or if the condition fails to improve as anticipated.  I provided 12 minutes of non-face-to-face time during this encounter.   [name], PA-C 09/11/2021 4:40 PM

## 2021-09-11 ENCOUNTER — Inpatient Hospital Stay (HOSPITAL_COMMUNITY): Payer: Medicare Other | Attending: Hematology | Admitting: Physician Assistant

## 2021-09-11 DIAGNOSIS — D500 Iron deficiency anemia secondary to blood loss (chronic): Secondary | ICD-10-CM | POA: Diagnosis not present

## 2021-09-21 ENCOUNTER — Ambulatory Visit: Payer: Medicare Other | Admitting: Gastroenterology

## 2021-10-06 NOTE — Progress Notes (Signed)
Referring Provider: Curlene Labrum, MD Primary Care Physician:  Curlene Labrum, MD Primary GI Physician: Dr. Abbey Chatters  Chief Complaint  Patient presents with   Colonoscopy    Last one was in 2018.    HPI:   Kristina Berry is a 76 y.o. female with history of IDA s/p EGD, colonoscopy, capsule study completed in 2018, found to have 8 mm tubular adenoma in the colon with recommendations to repeat exam in 5-10 years, multiple benign-appearing gastric polyps, gastritis, and mild ileitis on capsule study.  Last seen in our office in 2019 and was doing well at that time with no significant GI symptoms.  Recommended following up as needed.  She has been with hematology for IDA who has noted some trouble with iron absorption in the past requiring IV iron in 2021. Her hemoglobin has been stable for the last 2+ years in the 12 range. She is presenting today to discuss scheduling colonoscopy.    Today:  Having 4 BMs daily. Can be mushy or watery. No bleeding or black stools. No abdominal pain. No nocturnal stools. Started when she started [place] 1 month ago. Also had to take antibiotics for a UTI about 3 weeks ago. Diarrhea started after antibiotics. Previously had soft, formed, normal stools with 1-2 Bms daily. No well water, sick contacts, or recent travel.   No fever, nausea, vomiting. Had some weight loss when she was on ozempic but this caused nausea, vomiting, and she ended up having weight loss. Stayed on it for several months. Then tried trulicity x 1 month which also caused nausea and vomiting. This is why they switched to [place].   No heartburn on omeprazole 20 mg daily.   Patient reports she was found to be positive for a gene that increases her risk for colon cancer.  Genetic testing completed in 2019.   Past Medical History:  Diagnosis Date   Anemia    Anxiety    Baker's cyst of knee    Chronic low back pain    Diastolic dysfunction    Essential hypertension    Family  history of breast cancer    Family history of melanoma    Family history of stomach cancer    Family history of uterine cancer    GERD (gastroesophageal reflux disease)    Hyperlipidemia    Iron deficiency anemia 05/18/2016   LBBB (left bundle branch block)    Melanoma (HCC)    Mitral regurgitation    Mild to moderate 2011   Skin cancer    basal cell removed from face/melanoma removed from rt arm   Type 2 diabetes mellitus [facility])     Past Surgical History:  Procedure Laterality Date   ADENOIDECTOMY     BACK SURGERY  05/2014   BIOPSY  05/28/2016   Procedure: BIOPSY;  Surgeon: Danie Binder, MD;  [facility]: [facility];  Service: Endoscopy;;  duodenal and gastric   CHOLECYSTECTOMY  2009   COLONOSCOPY N/A 05/28/2016   Dr. Oneida Alar: one 8 mm tubular adenomas, diverticulosis in recto-sigmoid, sigmoid, and descending. Colonoscopy in 5-10 years if benefits outweigh the risks   ESOPHAGOGASTRODUODENOSCOPY N/A 05/28/2016   Dr. Oneida Alar: multiple benign-appearing gastric polyps, gastritis   GIVENS CAPSULE STUDY N/A 06/23/2016   Dr. Oneida Alar: mild ileitis   MELANOMA SURGERY Right    POLYPECTOMY  05/28/2016   Procedure: POLYPECTOMY;  Surgeon: Danie Binder, MD;  Location: [facility];  Service: Endoscopy;;  ascending colon  SKIN CANCER EXCISION     Basal cell removal from left side of face   TONSILLECTOMY      Current Outpatient Medications  Medication Sig Dispense Refill   aspirin 81 MG tablet Take 1 tablet by mouth daily.     atorvastatin (LIPITOR) 40 MG tablet Take 1 tablet by mouth daily.     Black Pepper-Turmeric (TURMERIC CURCUMIN) 08-998 MG CAPS Take 1 capsule by mouth every other day.     Cholecalciferol (VITAMIN D3) 2000 units TABS Take 1 tablet by mouth daily.     dapagliflozin propanediol (FARXIGA) 10 MG TABS tablet Take by mouth daily.     diltiazem (CARDIZEM CD) 240 MG 24 hr capsule TAKE 1 CAPSULE BY MOUTH  DAILY 90 capsule 3   fluticasone (FLONASE) 50 MCG/ACT nasal spray Place 2  sprays into both nostrils as needed for allergies or rhinitis.     furosemide (LASIX) 40 MG tablet Take 40 mg by mouth 2 (two) times daily.      irbesartan (AVAPRO) 150 MG tablet Take 1 tablet by mouth daily.     LANTUS SOLOSTAR 100 UNIT/ML Solostar Pen Inject 20 Units into the skin at bedtime.     loratadine (CLARITIN) 10 MG tablet Take 10 mg by mouth daily.     metFORMIN (GLUCOPHAGE) 1000 MG tablet Take 1 tablet by mouth 2 (two) times daily.     omeprazole (PRILOSEC) 20 MG capsule Take 1 capsule by mouth daily.     PARoxetine (PAXIL) 10 MG tablet Take 0.5 tablets by mouth daily.     No current facility-administered medications for this visit.    Allergies as of 10/08/2021 - Review Complete 10/08/2021  Allergen Reaction Noted   Cefdinir Other (See Comments) 10/24/2015   Nsaids Other (See Comments) 10/24/2015   Sulfa antibiotics Nausea Only 10/24/2015    Family History  Problem Relation Age of Onset   Osteoporosis Mother    Dementia Mother    Diabetes Mother    Melanoma Mother        dx in her 28s   Breast cancer Sister 57       triple negative   Heart Problems Father    Diabetes Father    Breast cancer Sister 68   Learning disabilities Sister    COPD Sister    Lung cancer Sister    Breast cancer Maternal Aunt 48   Stomach cancer Maternal Uncle    Uterine cancer Other 57   Colon cancer Neg Hx     Social History   Socioeconomic History   Marital status: Married    Spouse name: Not on file   Number of children: Not on file   Years of education: Not on file   Highest education level: Not on file  Occupational History   Not on file  Tobacco Use   Smoking status: Never   Smokeless tobacco: Never  Vaping Use   Vaping Use: Never used  Substance and Sexual Activity   Alcohol use: No    Alcohol/week: 0.0 standard drinks of alcohol   Drug use: No   Sexual activity: Not Currently    Comment: married  Other Topics Concern   Not on file  Social History Narrative    Not on file   Social Determinants of Health   Financial Resource Strain: Not on file  Food Insecurity: Not on file  Transportation Needs: Not on file  Physical Activity: Not on file  Stress: Not on file  Social Connections: Not  on file    Review of Systems: Gen: Denies fever, chills, cold or flulike symptoms, presyncope, syncope. CV: Denies chest pain, palpitations. Resp: Denies dyspnea, cough.  GI: See HPI Derm: Denies rash. Psych: Denies depression, anxiety. Heme: See HPI  Physical Exam: BP (!) 146/84 (BP Location: Left Arm, Patient Position: Sitting, Cuff Size: Large)   Pulse 76   Temp 97.7 F (36.5 C) (Oral)   Ht '5\' 1"'$  (1.549 m)   Wt 182 lb 12.8 oz (82.9 kg)   SpO2 93%   BMI 34.54 kg/m  General:   Alert and oriented. No distress noted. Pleasant and cooperative.  Head:  Normocephalic and atraumatic. Eyes:  Conjuctiva clear without scleral icterus. Heart:  S1, S2 present without murmurs appreciated. Lungs:  Clear to auscultation bilaterally. No wheezes, rales, or rhonchi. No distress.  Abdomen:  +BS, soft, non-tender and non-distended. No rebound or guarding. No HSM or masses noted. Msk:  Symmetrical without gross deformities. Normal posture. Extremities:  Without edema. Neurologic:  Alert and  oriented x4 Psych:  Normal mood and affect.    Assessment:  76 year old female with history of IDA s/p complete GI evaluation in 2018 and found to have 8 mm tubular adenoma in the colon with recommendations to repeat exam in 5 to 10 years, multiple benign-appearing gastric polyps, gastritis, mild ileitis on capsule study, following with hematology with hemoglobin stable for the last 2+ years, history of diastolic heart failure, LBBB, HTN, HLD, type 2 diabetes, presenting today to discuss scheduling surveillance colonoscopy and also reporting new onset diarrhea.  Diarrhea: New onset diarrhea x3 weeks with 4 loose to watery BMs daily, previously with 1-2 soft, formed bowel  movements daily.  She reports symptoms started after taking an antibiotic for UTI about 3 weeks ago and have not improved.  Denies abdominal pain, nausea, vomiting, fever, BRBPR, melena, sick contacts, well water, recent travel.  We will check stool studies to rule out infection.  Otherwise, we will proceed with a colonoscopy in the near future for surveillance purposes which will also help rule out other etiologies such as microscopic colitis.  History of adenomatous colon polyps: Last colonoscopy 2018 with 8 mm tubular adenoma.  Recommended 5 to 10-year surveillance.  Currently with new onset diarrhea discussed above.  Otherwise, no alarm symptoms.  Did have some weight loss, but states this occurred when she was on Ozempic for several months.  She also reported being positive for a gene the increased risk for colon cancer. Reviewed genetic testing completed in 2019.  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 MUTYH gene is associated with autosomal recessive MUTYH-associated polyposis; however, she is a carrier and this result is insufficient to cause autosomal recessive MAP.  Plan:  C. difficile by PCR, GI profile panel, and BMP at [facility]. If stool studies are negative, will arrange for surveillance colonoscopy with propofol with Dr. Abbey Chatters.  If still having diarrhea at the time of colonoscopy, recommend random colon biopsies to rule out microscopic colitis.  The risks, benefits, and alternatives have been discussed with the patient in detail. The patient states understanding and desires to proceed. ASA 3 Upon scheduling, I will provide separate instructions for diabetes medication adjustments. Follow-up date TBD.    Aliene Altes, PA-C [facility] 10/08/2021

## 2021-10-08 ENCOUNTER — Encounter: Payer: Self-pay | Admitting: Gastroenterology

## 2021-10-08 ENCOUNTER — Ambulatory Visit: Payer: Medicare Other | Admitting: Gastroenterology

## 2021-10-08 VITALS — BP 146/84 | HR 76 | Temp 97.7°F | Ht 61.0 in | Wt 182.8 lb

## 2021-10-08 DIAGNOSIS — R197 Diarrhea, unspecified: Secondary | ICD-10-CM

## 2021-10-08 DIAGNOSIS — Z86010 Personal history of colonic polyps: Secondary | ICD-10-CM | POA: Diagnosis not present

## 2021-10-08 NOTE — Patient Instructions (Signed)
Please have stool studies completed at [place] will also have blood work completed at [facility] to check your electrolytes due to diarrhea.  Once we rule out infectious causes of diarrhea, we will schedule your colonoscopy with Dr. Abbey Chatters.  Aliene Altes, PA-C [facility]

## 2021-10-09 DIAGNOSIS — I5032 Chronic diastolic (congestive) heart failure: Secondary | ICD-10-CM | POA: Diagnosis not present

## 2021-10-09 DIAGNOSIS — Z794 Long term (current) use of insulin: Secondary | ICD-10-CM | POA: Diagnosis not present

## 2021-10-09 DIAGNOSIS — E1165 Type 2 diabetes mellitus with hyperglycemia: Secondary | ICD-10-CM | POA: Diagnosis not present

## 2021-10-12 ENCOUNTER — Other Ambulatory Visit: Payer: Self-pay | Admitting: Gastroenterology

## 2021-10-12 DIAGNOSIS — R197 Diarrhea, unspecified: Secondary | ICD-10-CM | POA: Diagnosis not present

## 2021-10-14 LAB — GI PROFILE, STOOL, PCR

## 2021-10-14 LAB — BASIC METABOLIC PANEL
BUN/Creatinine Ratio: 23 (ref 12–28)
BUN: 16 mg/dL (ref 8–27)
CO2: 27 mmol/L (ref 20–29)
Calcium: 9.9 mg/dL (ref 8.7–10.3)
Chloride: 101 mmol/L (ref 96–106)
Creatinine, Ser: 0.7 mg/dL (ref 0.57–1.00)
Glucose: 196 mg/dL — ABNORMAL HIGH (ref 70–99)
Potassium: 4.9 mmol/L (ref 3.5–5.2)
Sodium: 143 mmol/L (ref 134–144)
eGFR: 90 mL/min/{1.73_m2} (ref >59)

## 2021-10-14 LAB — CLOSTRIDIUM DIFFICILE BY PCR: Toxigenic C. Difficile by PCR: NEGATIVE

## 2021-10-14 LAB — SPECIMEN STATUS REPORT

## 2021-10-15 ENCOUNTER — Telehealth: Payer: Self-pay

## 2021-10-15 NOTE — Telephone Encounter (Signed)
Pt LMOVM for someone to return her call regarding her test results

## 2021-10-15 NOTE — Telephone Encounter (Signed)
Spoke with patient. See stool study result note for further recommendations.

## 2021-10-30 DIAGNOSIS — I10 Essential (primary) hypertension: Secondary | ICD-10-CM | POA: Diagnosis not present

## 2021-10-30 DIAGNOSIS — K219 Gastro-esophageal reflux disease without esophagitis: Secondary | ICD-10-CM | POA: Diagnosis not present

## 2021-10-30 DIAGNOSIS — E7849 Other hyperlipidemia: Secondary | ICD-10-CM | POA: Diagnosis not present

## 2021-10-30 DIAGNOSIS — E870 Hyperosmolality and hypernatremia: Secondary | ICD-10-CM | POA: Diagnosis not present

## 2021-10-30 DIAGNOSIS — E1165 Type 2 diabetes mellitus with hyperglycemia: Secondary | ICD-10-CM | POA: Diagnosis not present

## 2021-10-30 DIAGNOSIS — I5032 Chronic diastolic (congestive) heart failure: Secondary | ICD-10-CM | POA: Diagnosis not present

## 2021-11-03 DIAGNOSIS — E1165 Type 2 diabetes mellitus with hyperglycemia: Secondary | ICD-10-CM | POA: Diagnosis not present

## 2021-11-03 DIAGNOSIS — R011 Cardiac murmur, unspecified: Secondary | ICD-10-CM | POA: Diagnosis not present

## 2021-11-03 DIAGNOSIS — I10 Essential (primary) hypertension: Secondary | ICD-10-CM | POA: Diagnosis not present

## 2021-11-03 DIAGNOSIS — I471 Supraventricular tachycardia: Secondary | ICD-10-CM | POA: Diagnosis not present

## 2021-11-03 DIAGNOSIS — E870 Hyperosmolality and hypernatremia: Secondary | ICD-10-CM | POA: Diagnosis not present

## 2021-11-03 DIAGNOSIS — D500 Iron deficiency anemia secondary to blood loss (chronic): Secondary | ICD-10-CM | POA: Diagnosis not present

## 2021-11-03 DIAGNOSIS — E7849 Other hyperlipidemia: Secondary | ICD-10-CM | POA: Diagnosis not present

## 2021-11-03 DIAGNOSIS — D126 Benign neoplasm of colon, unspecified: Secondary | ICD-10-CM | POA: Diagnosis not present

## 2021-11-10 ENCOUNTER — Encounter: Payer: Self-pay | Admitting: *Deleted

## 2021-11-10 ENCOUNTER — Telehealth: Payer: Self-pay | Admitting: *Deleted

## 2021-11-10 MED ORDER — NA SULFATE-K SULFATE-MG SULF 17.5-3.13-1.6 GM/177ML PO SOLN: ORAL | 0 refills | Status: DC

## 2021-11-10 NOTE — Telephone Encounter (Signed)
Spoke to pt, she informed me that her diarrhea has stopped and she is feeling better. Would like to schedule the colonoscopy?

## 2021-11-10 NOTE — Telephone Encounter (Signed)
Glad she is feeling better!  Yes, lets go ahead and proceed with scheduling a colonoscopy.  Mindy: Please proceed with scheduling colonoscopy with propofol with Dr. Abbey Chatters. Dx: History of colon polyps. ASA 3  1 day prior to procedure: Take one half dose of metformin (500 mg in the morning and evening), one half dose of Lantus (10 units at bedtime), take [place] as prescribed.  Day of procedure: No morning diabetes medications.

## 2021-11-10 NOTE — Telephone Encounter (Signed)
Spoke with pt. Scheduled for 8/21 at 9am. She did not want gallon prep. Instructions/pre-op mailed. Rx sent to pharmacy   PA approved via [facility]. Auth# [id number], DOS: Nov 30, 2021 - Feb 28, 2022

## 2021-11-10 NOTE — Telephone Encounter (Signed)
Noted  

## 2021-11-10 NOTE — Addendum Note (Signed)
Addended by: Cheron Every on: 11/10/2021 03:33 PM   Modules accepted: Orders

## 2021-11-23 NOTE — Patient Instructions (Signed)
Kristina Berry  11/23/2021     '@PREFPERIOPPHARMACY'$ @   Your procedure is scheduled on  11/30/2021.   Report to [facility] at  0730 A.M.   Call this number if you have problems the morning of surgery:  [phone]   Remember:  Follow the diet and prep instructions given to you by the office.    Take 10 units of your night time insulin the night before your procedure.      DO NOT take any medications for diabetes the morning of your procedure.     Take these medicines the morning of surgery with A SIP OF WATER                                  Diltiazem, prilosec, paxil.     Do not wear jewelry, make-up or nail polish.  Do not wear lotions, powders, or perfumes, or deodorant.  Do not shave 48 hours prior to surgery.  Men may shave face and neck.  Do not bring valuables to the hospital.  [facility] is not responsible for any belongings or valuables.  Contacts, dentures or bridgework may not be worn into surgery.  Leave your suitcase in the car.  After surgery it may be brought to your room.  For patients admitted to the hospital, discharge time will be determined by your treatment team.  Patients discharged the day of surgery will not be allowed to drive home and must have someone with them for 24 hours.    Special instructions:   DO NOT smoke tobacco or vape for 24 hours before your procedure.  Please read over the following fact sheets that you were given. Anesthesia Post-op Instructions and Care and Recovery After Surgery      Colonoscopy, Adult, Care After The following information offers guidance on how to care for yourself after your procedure. Your health care provider may also give you more specific instructions. If you have problems or questions, contact your health care provider. What can I expect after the procedure? After the procedure, it is common to have: A small amount of blood in your stool for 24 hours after the procedure. Some  gas. Mild cramping or bloating of your abdomen. Follow these instructions at home: Eating and drinking  Drink enough fluid to keep your urine pale yellow. Follow instructions from your health care provider about eating or drinking restrictions. Resume your normal diet as told by your health care provider. Avoid heavy or fried foods that are hard to digest. Activity Rest as told by your health care provider. Avoid sitting for a long time without moving. Get up to take short walks every 1-2 hours. This is important to improve blood flow and breathing. Ask for help if you feel weak or unsteady. Return to your normal activities as told by your health care provider. Ask your health care provider what activities are safe for you. Managing cramping and bloating  Try walking around when you have cramps or feel bloated. If directed, apply heat to your abdomen as told by your health care provider. Use the heat source that your health care provider recommends, such as a moist heat pack or a heating pad. Place a towel between your skin and the heat source. Leave the heat on for 20-30 minutes. Remove the heat if your skin turns bright red. This is especially important if you are unable  to feel pain, heat, or cold. You have a greater risk of getting burned. General instructions If you were given a sedative during the procedure, it can affect you for several hours. Do not drive or operate machinery until your health care provider says that it is safe. For the first 24 hours after the procedure: Do not sign important documents. Do not drink alcohol. Do your regular daily activities at a slower pace than normal. Eat soft foods that are easy to digest. Take over-the-counter and prescription medicines only as told by your health care provider. Keep all follow-up visits. This is important. Contact a health care provider if: You have blood in your stool 2-3 days after the procedure. Get help right away  if: You have more than a small spotting of blood in your stool. You have large blood clots in your stool. You have swelling of your abdomen. You have nausea or vomiting. You have a fever. You have increasing pain in your abdomen that is not relieved with medicine. These symptoms may be an emergency. Get help right away. Call 911. Do not wait to see if the symptoms will go away. Do not drive yourself to the hospital. Summary After the procedure, it is common to have a small amount of blood in your stool. You may also have mild cramping and bloating of your abdomen. If you were given a sedative during the procedure, it can affect you for several hours. Do not drive or operate machinery until your health care provider says that it is safe. Get help right away if you have a lot of blood in your stool, nausea or vomiting, a fever, or increased pain in your abdomen. This information is not intended to replace advice given to you by your health care provider. Make sure you discuss any questions you have with your health care provider. Document Revised: 11/19/2020 Document Reviewed: 11/19/2020 Elsevier Patient Education  [place] After This sheet gives you information about how to care for yourself after your procedure. Your health care provider may also give you more specific instructions. If you have problems or questions, contact your health care provider. What can I expect after the procedure? After the procedure, it is common to have: Tiredness. Forgetfulness about what happened after the procedure. Impaired judgment for important decisions. Nausea or vomiting. Some difficulty with balance. Follow these instructions at home: For the time period you were told by your health care provider:     Rest as needed. Do not participate in activities where you could fall or become injured. Do not drive or use machinery. Do not drink alcohol. Do not take  sleeping pills or medicines that cause drowsiness. Do not make important decisions or sign legal documents. Do not take care of children on your own. Eating and drinking Follow the diet that is recommended by your health care provider. Drink enough fluid to keep your urine pale yellow. If you vomit: Drink water, juice, or soup when you can drink without vomiting. Make sure you have little or no nausea before eating solid foods. General instructions Have a responsible adult stay with you for the time you are told. It is important to have someone help care for you until you are awake and alert. Take over-the-counter and prescription medicines only as told by your health care provider. If you have sleep apnea, surgery and certain medicines can increase your risk for breathing problems. Follow instructions from your health care provider about  wearing your sleep device: Anytime you are sleeping, including during daytime naps. While taking prescription pain medicines, sleeping medicines, or medicines that make you drowsy. Avoid smoking. Keep all follow-up visits as told by your health care provider. This is important. Contact a health care provider if: You keep feeling nauseous or you keep vomiting. You feel light-headed. You are still sleepy or having trouble with balance after 24 hours. You develop a rash. You have a fever. You have redness or swelling around the IV site. Get help right away if: You have trouble breathing. You have new-onset confusion at home. Summary For several hours after your procedure, you may feel tired. You may also be forgetful and have poor judgment. Have a responsible adult stay with you for the time you are told. It is important to have someone help care for you until you are awake and alert. Rest as told. Do not drive or operate machinery. Do not drink alcohol or take sleeping pills. Get help right away if you have trouble breathing, or if you suddenly become  confused. This information is not intended to replace advice given to you by your health care provider. Make sure you discuss any questions you have with your health care provider. Document Revised: 03/03/2021 Document Reviewed: 03/01/2019 Elsevier Patient Education  [place].

## 2021-11-25 ENCOUNTER — Encounter (HOSPITAL_COMMUNITY)
Admission: RE | Admit: 2021-11-25 | Discharge: 2021-11-25 | Disposition: A | Discharge status: Home / Self Care | Payer: Medicare Other | Source: Ambulatory Visit | Attending: Internal Medicine | Admitting: Internal Medicine

## 2021-11-25 VITALS — BP 187/61 | HR 72 | Temp 97.7°F | Resp 18 | Ht 61.0 in | Wt 182.8 lb

## 2021-11-25 DIAGNOSIS — I10 Essential (primary) hypertension: Secondary | ICD-10-CM | POA: Diagnosis not present

## 2021-11-25 DIAGNOSIS — Z01818 Encounter for other preprocedural examination: Secondary | ICD-10-CM | POA: Diagnosis not present

## 2021-11-26 ENCOUNTER — Telehealth: Payer: Self-pay | Admitting: *Deleted

## 2021-11-26 NOTE — Telephone Encounter (Signed)
Patient left vm wanting to know where to pick up the prep for the colonoscopy.  Kristina Berry

## 2021-11-30 ENCOUNTER — Ambulatory Visit (HOSPITAL_BASED_OUTPATIENT_CLINIC_OR_DEPARTMENT_OTHER): Payer: Medicare Other | Admitting: Anesthesiology

## 2021-11-30 ENCOUNTER — Ambulatory Visit (HOSPITAL_COMMUNITY): Payer: Medicare Other | Admitting: Anesthesiology

## 2021-11-30 ENCOUNTER — Other Ambulatory Visit: Payer: Self-pay

## 2021-11-30 ENCOUNTER — Encounter (HOSPITAL_COMMUNITY)
Admission: RE | Disposition: A | Discharge status: Home / Self Care | Payer: Self-pay | Source: Ambulatory Visit | Attending: Internal Medicine

## 2021-11-30 ENCOUNTER — Encounter (HOSPITAL_COMMUNITY): Payer: Self-pay

## 2021-11-30 ENCOUNTER — Ambulatory Visit (HOSPITAL_COMMUNITY)
Admission: RE | Admit: 2021-11-30 | Discharge: 2021-11-30 | Disposition: A | Discharge status: Home / Self Care | Payer: Medicare Other | Source: Ambulatory Visit | Attending: Internal Medicine | Admitting: Internal Medicine

## 2021-11-30 DIAGNOSIS — Z09 Encounter for follow-up examination after completed treatment for conditions other than malignant neoplasm: Secondary | ICD-10-CM

## 2021-11-30 DIAGNOSIS — E119 Type 2 diabetes mellitus without complications: Secondary | ICD-10-CM | POA: Insufficient documentation

## 2021-11-30 DIAGNOSIS — I10 Essential (primary) hypertension: Secondary | ICD-10-CM | POA: Diagnosis not present

## 2021-11-30 DIAGNOSIS — Z1211 Encounter for screening for malignant neoplasm of colon: Secondary | ICD-10-CM | POA: Insufficient documentation

## 2021-11-30 DIAGNOSIS — D124 Benign neoplasm of descending colon: Secondary | ICD-10-CM | POA: Diagnosis not present

## 2021-11-30 DIAGNOSIS — K5730 Diverticulosis of large intestine without perforation or abscess without bleeding: Secondary | ICD-10-CM | POA: Diagnosis not present

## 2021-11-30 DIAGNOSIS — K648 Other hemorrhoids: Secondary | ICD-10-CM | POA: Diagnosis not present

## 2021-11-30 DIAGNOSIS — F419 Anxiety disorder, unspecified: Secondary | ICD-10-CM | POA: Diagnosis not present

## 2021-11-30 DIAGNOSIS — K219 Gastro-esophageal reflux disease without esophagitis: Secondary | ICD-10-CM | POA: Diagnosis not present

## 2021-11-30 DIAGNOSIS — Z86010 Personal history of colonic polyps: Secondary | ICD-10-CM | POA: Diagnosis not present

## 2021-11-30 DIAGNOSIS — K635 Polyp of colon: Secondary | ICD-10-CM

## 2021-11-30 HISTORY — PX: POLYPECTOMY: SHX149

## 2021-11-30 HISTORY — PX: COLONOSCOPY WITH PROPOFOL: SHX5780

## 2021-11-30 LAB — GLUCOSE, CAPILLARY: Glucose-Capillary: 197 mg/dL — ABNORMAL HIGH (ref 70–99)

## 2021-11-30 SURGERY — COLONOSCOPY WITH PROPOFOL
Anesthesia: General

## 2021-11-30 MED ORDER — LACTATED RINGERS IV SOLN: INTRAVENOUS | Status: DC

## 2021-11-30 MED ORDER — PROPOFOL 10 MG/ML IV BOLUS
INTRAVENOUS | Status: DC | PRN
  Administered 2021-11-30: 80 mg via INTRAVENOUS

## 2021-11-30 MED ORDER — PROPOFOL 500 MG/50ML IV EMUL
INTRAVENOUS | Status: DC | PRN
  Administered 2021-11-30: 150 ug/kg/min via INTRAVENOUS

## 2021-11-30 MED ORDER — LIDOCAINE 2% (20 MG/ML) 5 ML SYRINGE
INTRAMUSCULAR | Status: DC | PRN
  Administered 2021-11-30: 50 mg via INTRAVENOUS

## 2021-11-30 NOTE — Op Note (Signed)
[facility] Patient Name: Kristina Berry Procedure Date: 11/30/2021 8:32 AM MRN: [id number] Date of Birth: 1945/06/10 Attending MD: Elon Alas. Abbey Chatters DO CSN: [id number] Age: 76 Admit Type: Outpatient Procedure:                Colonoscopy Indications:              Surveillance: Personal history of adenomatous                            polyps on last colonoscopy 5 years ago Providers:                Elon Alas. Abbey Chatters, DO, Crystal Page, Caprice Kluver,                            Everardo Pacific, Thomas Hoff., Technician,                            Ladoris Gene Technician, Technician Referring MD:              Medicines:                See the Anesthesia note for documentation of the                            administered medications Complications:            No immediate complications. Estimated Blood Loss:     Estimated blood loss was minimal. Procedure:                Pre-Anesthesia Assessment:                           - The anesthesia plan was to use monitored                            anesthesia care (MAC).                           After obtaining informed consent, the colonoscope                            was passed under direct vision. Throughout the                            procedure, the patient's blood pressure, pulse, and                            oxygen saturations were monitored continuously. The                            PCF-HQ190L ([id number]) scope was introduced through                            the anus and advanced to the the cecum, identified                            by appendiceal orifice and  ileocecal valve. The                            colonoscopy was performed without difficulty. The                            patient tolerated the procedure well. The quality                            of the bowel preparation was evaluated using the                            BBPS [facility] Bowel Preparation Scale) with scores                            of:  Right Colon = 3, Transverse Colon = 3 and Left                            Colon = 3 (entire mucosa seen well with no residual                            staining, small fragments of stool or opaque                            liquid). The total BBPS score equals 9. Scope In: 8:44:47 AM Scope Out: 8:54:36 AM Scope Withdrawal Time: 0 hours 6 minutes 10 seconds  Total Procedure Duration: 0 hours 9 minutes 49 seconds  Findings:      Non-bleeding internal hemorrhoids were found during endoscopy.      Multiple small and large-mouthed diverticula were found in the sigmoid       colon.      A 5 mm polyp was found in the descending colon. The polyp was sessile.       The polyp was removed with a cold snare. Resection and retrieval were       complete.      The exam was otherwise without abnormality. Impression:               - Non-bleeding internal hemorrhoids.                           - Diverticulosis in the sigmoid colon.                           - One 5 mm polyp in the descending colon, removed                            with a cold snare. Resected and retrieved.                           - The examination was otherwise normal. Moderate Sedation:      Per Anesthesia Care Recommendation:           - Patient has a contact number available for  emergencies. The signs and symptoms of potential                            delayed complications were discussed with the                            patient. Return to normal activities tomorrow.                            Written discharge instructions were provided to the                            patient.                           - Resume previous diet.                           - Continue present medications.                           - Await pathology results.                           - No repeat colonoscopy due to age.                           - Return to GI clinic PRN. Procedure Code(s):        --- Professional  ---                           [phone], Colonoscopy, flexible; with removal of                            tumor(s), polyp(s), or other lesion(s) by snare                            technique Diagnosis Code(s):        --- Professional ---                           Z86.010, Personal history of colonic polyps                           K64.8, Other hemorrhoids                           K63.5, Polyp of colon                           K57.30, Diverticulosis of large intestine without                            perforation or abscess without bleeding CPT copyright 2019 American Medical Association. All rights reserved. The codes documented in this report are preliminary and upon coder review may  be revised to meet current compliance requirements. Elon Alas. Abbey Chatters, DO [place]  Abbey Chatters, DO 11/30/2021 8:56:50 AM This report has been signed electronically. Number of Addenda: 0

## 2021-11-30 NOTE — Transfer of Care (Signed)
Immediate Anesthesia Transfer of Care Note  Patient: Kristina Berry  Procedure(s) Performed: COLONOSCOPY WITH PROPOFOL POLYPECTOMY INTESTINAL  Patient Location: Short Stay  Anesthesia Type:MAC  Level of Consciousness: awake, alert , oriented and responds to stimulation  Airway & Oxygen Therapy: Patient Spontanous Breathing and Patient connected to nasal cannula oxygen  Post-op Assessment: Report given to RN, Post -op Vital signs reviewed and stable and Patient moving all extremities  Post vital signs: Reviewed and stable  Last Vitals:  Vitals Value Taken Time  BP 109/53 11/30/21 0859  Temp 36.7 C 11/30/21 0859  Pulse 69 11/30/21 0859  Resp 15 11/30/21 0859  SpO2 98 % 11/30/21 0859    Last Pain:  Vitals:   11/30/21 0859  TempSrc: Oral  PainSc: 0-No pain      Patients Stated Pain Goal: 8 (76/39/43 2003)  Complications: No notable events documented.

## 2021-11-30 NOTE — H&P (Signed)
Primary Care Physician:  Curlene Labrum, MD Primary Gastroenterologist:  Dr. Abbey Chatters  Pre-Procedure History & Physical: HPI:  Kristina Berry is a 76 y.o. female is here  for a colonoscopy to be performed for surveillance purposes/personal history of adenomatous colon polyp 5 years ago.   Past Medical History:  Diagnosis Date   Anemia    Anxiety    Baker's cyst of knee    Chronic low back pain    Diastolic dysfunction    Essential hypertension    Family history of breast cancer    Family history of melanoma    Family history of stomach cancer    Family history of uterine cancer    GERD (gastroesophageal reflux disease)    Hyperlipidemia    Iron deficiency anemia 05/18/2016   LBBB (left bundle branch block)    Melanoma (HCC)    Mitral regurgitation    Mild to moderate 2011   Skin cancer    basal cell removed from face/melanoma removed from rt arm   Type 2 diabetes mellitus [facility])     Past Surgical History:  Procedure Laterality Date   ADENOIDECTOMY     BACK SURGERY  05/2014   BIOPSY  05/28/2016   Procedure: BIOPSY;  Surgeon: Danie Binder, MD;  Location: [facility];  Service: Endoscopy;;  duodenal and gastric   CHOLECYSTECTOMY  2009   COLONOSCOPY N/A 05/28/2016   Dr. Oneida Alar: one 8 mm tubular adenomas, diverticulosis in recto-sigmoid, sigmoid, and descending. Colonoscopy in 5-10 years if benefits outweigh the risks   ESOPHAGOGASTRODUODENOSCOPY N/A 05/28/2016   Dr. Oneida Alar: multiple benign-appearing gastric polyps, gastritis   GIVENS CAPSULE STUDY N/A 06/23/2016   Dr. Oneida Alar: mild ileitis   MELANOMA SURGERY Right    POLYPECTOMY  05/28/2016   Procedure: POLYPECTOMY;  Surgeon: Danie Binder, MD;  Location: [facility];  Service: Endoscopy;;  ascending colon    SKIN CANCER EXCISION     Basal cell removal from left side of face   TONSILLECTOMY      Prior to Admission medications   Medication Sig Start Date End Date Taking? Authorizing Provider  aspirin 81 MG tablet  Take 81 mg by mouth at bedtime.   Yes [provider]  Cholecalciferol (VITAMIN D3) 2000 units TABS Take 2,000 Units by mouth daily.   Yes [provider]  dapagliflozin propanediol (FARXIGA) 10 MG TABS tablet Take 10 mg by mouth daily.   Yes [provider]  diltiazem (CARDIZEM CD) 240 MG 24 hr capsule TAKE 1 CAPSULE BY MOUTH  DAILY 03/12/21  Yes Satira Sark, MD  fluticasone [facility]) 50 MCG/ACT nasal spray Place 2 sprays into both nostrils as needed for allergies or rhinitis.   Yes [provider]  furosemide (LASIX) 40 MG tablet Take 40 mg by mouth daily. Additional 40 me if needed for fluid 08/28/15 11/19/21 Yes [provider]  irbesartan (AVAPRO) 150 MG tablet Take 150 mg by mouth daily.   Yes [provider]  LANTUS SOLOSTAR 100 UNIT/ML Solostar Pen Inject 20 Units into the skin at bedtime. 11/15/19  Yes [provider]  loratadine (CLARITIN) 10 MG tablet Take 10 mg by mouth daily.   Yes [provider]  magnesium oxide (MAG-OX) 400 (240 Mg) MG tablet Take 400 mg by mouth daily.   Yes [provider]  metFORMIN (GLUCOPHAGE) 1000 MG tablet Take 1,000 mg by mouth 2 (two) times daily.   Yes [provider]  Multiple Vitamins-Minerals (PRESERVISION  AREDS 2) CAPS Take 1 capsule by mouth daily.   Yes [provider]  Na Sulfate-K Sulfate-Mg Sulf 17.5-3.13-1.6 GM/177ML SOLN As directed 11/10/21  Yes Annalysia Willenbring, Elon Alas, DO  omeprazole (PRILOSEC) 20 MG capsule Take 20-40 capsules by mouth daily.   Yes [provider]  PARoxetine (PAXIL) 10 MG tablet Take 0.5 tablets by mouth daily. 02/25/15  Yes [provider]  zinc gluconate 50 MG tablet Take 50 mg by mouth daily.   Yes [provider]  Ascorbic Acid (VITAMIN C) 1000 MG tablet Take 1,000 mg by mouth daily.    [provider]  atorvastatin (LIPITOR) 40 MG tablet Take 40 mg by mouth daily.    [provider]   Black Pepper-Turmeric (TURMERIC CURCUMIN) 08-998 MG CAPS Take 1 capsule by mouth daily.    [provider]    Allergies as of 11/10/2021 - Review Complete 10/08/2021  Allergen Reaction Noted   Cefdinir Other (See Comments) 10/24/2015   Nsaids Other (See Comments) 10/24/2015   Sulfa antibiotics Nausea Only 10/24/2015    Family History  Problem Relation Age of Onset   Osteoporosis Mother    Dementia Mother    Diabetes Mother    Melanoma Mother        dx in her 67s   Breast cancer Sister 64       triple negative   Heart Problems Father    Diabetes Father    Breast cancer Sister 38   Learning disabilities Sister    COPD Sister    Lung cancer Sister    Breast cancer Maternal Aunt 48   Stomach cancer Maternal Uncle    Uterine cancer Other 21   Colon cancer Neg Hx     Social History   Socioeconomic History   Marital status: Married    Spouse name: Not on file   Number of children: Not on file   Years of education: Not on file   Highest education level: Not on file  Occupational History   Not on file  Tobacco Use   Smoking status: Never   Smokeless tobacco: Never  Vaping Use   Vaping Use: Never used  Substance and Sexual Activity   Alcohol use: No    Alcohol/week: 0.0 standard drinks of alcohol   Drug use: No   Sexual activity: Not Currently    Comment: married  Other Topics Concern   Not on file  Social History Narrative   Not on file   Social Determinants of Health   Financial Resource Strain: Not on file  Food Insecurity: Not on file  Transportation Needs: Not on file  Physical Activity: Not on file  Stress: Not on file  Social Connections: Not on file  Intimate Partner Violence: Not on file    Review of Systems: See HPI, otherwise negative ROS  Physical Exam: Vital signs in last 24 hours: Temp:  [97.6 F (36.4 C)] 97.6 F (36.4 C) (08/21 0759) Resp:  [16] 16 (08/21 0759) BP: (160)/(73) 160/73 (08/21 0759) SpO2:  [97 %] 97 % (08/21  0759)   General:   Alert,  Well-developed, well-nourished, pleasant and cooperative in NAD Head:  Normocephalic and atraumatic. Eyes:  Sclera clear, no icterus.   Conjunctiva pink. Ears:  Normal auditory acuity. Nose:  No deformity, discharge,  or lesions. Mouth:  No deformity or lesions, dentition normal. Neck:  Supple; no masses or thyromegaly. Lungs:  Clear throughout to auscultation.   No wheezes, crackles, or rhonchi. No acute  distress. Heart:  Regular rate and rhythm; no murmurs, clicks, rubs,  or gallops. Abdomen:  Soft, nontender and nondistended. No masses, hepatosplenomegaly or hernias noted. Normal bowel sounds, without guarding, and without rebound.   Msk:  Symmetrical without gross deformities. Normal posture. Extremities:  Without clubbing or edema. Neurologic:  Alert and  oriented x4;  grossly normal neurologically. Skin:  Intact without significant lesions or rashes. Cervical Nodes:  No significant cervical adenopathy. Psych:  Alert and cooperative. Normal mood and affect.  Impression/Plan: IDANIA DESOUZA is here for a colonoscopy to be performed for surveillance purposes/personal history of adenomatous colon polyp 5 years ago.   The risks of the procedure including infection, bleed, or perforation as well as benefits, limitations, alternatives and imponderables have been reviewed with the patient. Questions have been answered. All parties agreeable.

## 2021-11-30 NOTE — Anesthesia Postprocedure Evaluation (Signed)
Anesthesia Post Note  Patient: Kristina Berry  Procedure(s) Performed: COLONOSCOPY WITH PROPOFOL POLYPECTOMY INTESTINAL  Patient location during evaluation: Phase II Anesthesia Type: General Level of consciousness: awake Pain management: pain level controlled Vital Signs Assessment: post-procedure vital signs reviewed and stable Respiratory status: spontaneous breathing and respiratory function stable Cardiovascular status: blood pressure returned to baseline and stable Postop Assessment: no headache and no apparent nausea or vomiting Anesthetic complications: no Comments: Late entry   No notable events documented.   Last Vitals:  Vitals:   11/30/21 0759 11/30/21 0859  BP: (!) 160/73 (!) 109/53  Pulse:  69  Resp: 16 15  Temp: 36.4 C 36.7 C  SpO2: 97% 98%    Last Pain:  Vitals:   11/30/21 0859  TempSrc: Oral  PainSc: 0-No pain                 Louann Sjogren

## 2021-11-30 NOTE — Discharge Instructions (Addendum)
  Colonoscopy Discharge Instructions  Read the instructions outlined below and refer to this sheet in the next few weeks. These discharge instructions provide you with general information on caring for yourself after you leave the hospital. Your doctor may also give you specific instructions. While your treatment has been planned according to the most current medical practices available, unavoidable complications occasionally occur.   ACTIVITY You may resume your regular activity, but move at a slower pace for the next 24 hours.  Take frequent rest periods for the next 24 hours.  Walking will help get rid of the air and reduce the bloated feeling in your belly (abdomen).  No driving for 24 hours (because of the medicine (anesthesia) used during the test).   Do not sign any important legal documents or operate any machinery for 24 hours (because of the anesthesia used during the test).  NUTRITION Drink plenty of fluids.  You may resume your normal diet as instructed by your doctor.  Begin with a light meal and progress to your normal diet. Heavy or fried foods are harder to digest and may make you feel sick to your stomach (nauseated).  Avoid alcoholic beverages for 24 hours or as instructed.  MEDICATIONS You may resume your normal medications unless your doctor tells you otherwise.  WHAT YOU CAN EXPECT TODAY Some feelings of bloating in the abdomen.  Passage of more gas than usual.  Spotting of blood in your stool or on the toilet paper.  IF YOU HAD POLYPS REMOVED DURING THE COLONOSCOPY: No aspirin products for 7 days or as instructed.  No alcohol for 7 days or as instructed.  Eat a soft diet for the next 24 hours.  FINDING OUT THE RESULTS OF YOUR TEST Not all test results are available during your visit. If your test results are not back during the visit, make an appointment with your caregiver to find out the results. Do not assume everything is normal if you have not heard from your  caregiver or the medical facility. It is important for you to follow up on all of your test results.  SEEK IMMEDIATE MEDICAL ATTENTION IF: You have more than a spotting of blood in your stool.  Your belly is swollen (abdominal distention).  You are nauseated or vomiting.  You have a temperature over 101.  You have abdominal pain or discomfort that is severe or gets worse throughout the day.   Your colonoscopy revealed 1 polyp(s) which I removed successfully. Await pathology results, my office will contact you. Given your age I think it is reasonable to not pursue further colonoscopies in the future for colon cancer screening.   You also have diverticulosis and internal hemorrhoids. I would recommend increasing fiber in your diet or adding OTC Benefiber/Metamucil. Be sure to drink at least 4 to 6 glasses of water daily. Follow-up with GI as needed.   I hope you have a great rest of your week!  Elon Alas. Abbey Chatters, D.O. Gastroenterology and Hepatology [facility]

## 2021-11-30 NOTE — Anesthesia Preprocedure Evaluation (Signed)
Anesthesia Evaluation  Patient identified by MRN, date of birth, ID band Patient awake    Reviewed: Allergy & Precautions, H&P , NPO status , Patient's Chart, lab work & pertinent test results, reviewed documented beta blocker date and time   Airway Mallampati: II  TM Distance: >3 FB Neck ROM: full    Dental no notable dental hx.    Pulmonary neg pulmonary ROS,    Pulmonary exam normal breath sounds clear to auscultation       Cardiovascular Exercise Tolerance: Good hypertension, negative cardio ROS   Rhythm:regular Rate:Normal     Neuro/Psych PSYCHIATRIC DISORDERS Anxiety negative neurological ROS     GI/Hepatic Neg liver ROS, GERD  Medicated,  Endo/Other  negative endocrine ROSdiabetes, Type 2  Renal/GU negative Renal ROS  negative genitourinary   Musculoskeletal   Abdominal   Peds  Hematology  (+) Blood dyscrasia, anemia ,   Anesthesia Other Findings   Reproductive/Obstetrics negative OB ROS                             Anesthesia Physical Anesthesia Plan  ASA: 2  Anesthesia Plan: General   Post-op Pain Management:    Induction:   PONV Risk Score and Plan: Propofol infusion  Airway Management Planned:   Additional Equipment:   Intra-op Plan:   Post-operative Plan:   Informed Consent: I have reviewed the patients History and Physical, chart, labs and discussed the procedure including the risks, benefits and alternatives for the proposed anesthesia with the patient or authorized representative who has indicated his/her understanding and acceptance.     Dental Advisory Given  Plan Discussed with: CRNA  Anesthesia Plan Comments:         Anesthesia Quick Evaluation

## 2021-12-01 LAB — SURGICAL PATHOLOGY

## 2021-12-01 NOTE — Telephone Encounter (Signed)
Patient had colonoscopy done on 11/30/21

## 2021-12-04 ENCOUNTER — Encounter (INDEPENDENT_AMBULATORY_CARE_PROVIDER_SITE_OTHER): Payer: Medicare Other | Admitting: Ophthalmology

## 2021-12-04 DIAGNOSIS — H35033 Hypertensive retinopathy, bilateral: Secondary | ICD-10-CM

## 2021-12-04 DIAGNOSIS — H353132 Nonexudative age-related macular degeneration, bilateral, intermediate dry stage: Secondary | ICD-10-CM | POA: Diagnosis not present

## 2021-12-04 DIAGNOSIS — H43813 Vitreous degeneration, bilateral: Secondary | ICD-10-CM

## 2021-12-04 DIAGNOSIS — D3132 Benign neoplasm of left choroid: Secondary | ICD-10-CM | POA: Diagnosis not present

## 2021-12-04 DIAGNOSIS — H35372 Puckering of macula, left eye: Secondary | ICD-10-CM | POA: Diagnosis not present

## 2021-12-04 DIAGNOSIS — I10 Essential (primary) hypertension: Secondary | ICD-10-CM | POA: Diagnosis not present

## 2021-12-04 DIAGNOSIS — D3131 Benign neoplasm of right choroid: Secondary | ICD-10-CM | POA: Diagnosis not present

## 2021-12-07 ENCOUNTER — Encounter (HOSPITAL_COMMUNITY): Payer: Self-pay | Admitting: Internal Medicine

## 2021-12-11 DIAGNOSIS — R110 Nausea: Secondary | ICD-10-CM | POA: Diagnosis not present

## 2021-12-11 DIAGNOSIS — R109 Unspecified abdominal pain: Secondary | ICD-10-CM | POA: Diagnosis not present

## 2021-12-16 ENCOUNTER — Inpatient Hospital Stay: Payer: Medicare Other | Attending: Hematology | Admitting: Cardiology

## 2021-12-16 ENCOUNTER — Encounter: Payer: Self-pay | Admitting: Cardiology

## 2021-12-16 VITALS — BP 122/50 | HR 67 | Ht 61.0 in | Wt 176.0 lb

## 2021-12-16 DIAGNOSIS — R011 Cardiac murmur, unspecified: Secondary | ICD-10-CM

## 2021-12-16 DIAGNOSIS — I471 Supraventricular tachycardia, unspecified: Secondary | ICD-10-CM

## 2021-12-16 NOTE — Patient Instructions (Addendum)
Medication Instructions:  Your physician recommends that you continue on your current medications as directed. Please refer to the Current Medication list given to you today.  Labwork: none  Testing/Procedures: Your physician has requested that you have an echocardiogram. Echocardiography is a painless test that uses sound waves to create images of your heart. It provides your doctor with information about the size and shape of your heart and how well your heart's chambers and valves are working. This procedure takes approximately one hour. There are no restrictions for this procedure.  Follow-Up: Your physician recommends that you schedule a follow-up appointment in: 1 year. You will receive a reminder call in the mail in about 10 months reminding you to call and schedule your appointment. If you don't receive this call, please contact our office.  Any Other Special Instructions Will Be Listed Below (If Applicable).  If you need a refill on your cardiac medications before your next appointment, please call your pharmacy.

## 2021-12-16 NOTE — Progress Notes (Signed)
Cardiology Office Note  Date: 12/16/2021   ID: Kristina Berry, DOB December 02, 1945, MRN [id number]  PCP:  Curlene Labrum, MD  Cardiologist:  Rozann Lesches, MD Electrophysiologist:  None   Chief Complaint  Patient presents with   Cardiac follow-up    History of Present Illness: Kristina Berry is a 76 y.o. female last seen in February 2022.  She is here for a follow-up visit.  Reports no interval palpitations since last encounter.  I went over her medications, she has done well on Cardizem CD for suppression of PSVT.  Her last echocardiogram was in 2019 as noted below.  She does not report any dizziness or syncope.  Functional with ADLs.  Past Medical History:  Diagnosis Date   Anemia    Anxiety    Baker's cyst of knee    Chronic low back pain    Diastolic dysfunction    Essential hypertension    Family history of breast cancer    Family history of melanoma    Family history of stomach cancer    Family history of uterine cancer    GERD (gastroesophageal reflux disease)    Hyperlipidemia    Iron deficiency anemia 05/18/2016   LBBB (left bundle branch block)    Melanoma (HCC)    Mitral regurgitation    Mild to moderate 2011   Skin cancer    basal cell removed from face/melanoma removed from rt arm   Type 2 diabetes mellitus [facility])     Past Surgical History:  Procedure Laterality Date   ADENOIDECTOMY     BACK SURGERY  05/2014   BIOPSY  05/28/2016   Procedure: BIOPSY;  Surgeon: Danie Binder, MD;  [facility];  Service: Endoscopy;;  duodenal and gastric   CHOLECYSTECTOMY  2009   COLONOSCOPY N/A 05/28/2016   Dr. Oneida Alar: one 8 mm tubular adenomas, diverticulosis in recto-sigmoid, sigmoid, and descending. Colonoscopy in 5-10 years if benefits outweigh the risks   COLONOSCOPY WITH PROPOFOL N/A 11/30/2021   Procedure: COLONOSCOPY WITH PROPOFOL;  Surgeon: Eloise Harman, DO;  [facility];  Service: Endoscopy;  Laterality: N/A;  9:00am    ESOPHAGOGASTRODUODENOSCOPY N/A 05/28/2016   Dr. Oneida Alar: multiple benign-appearing gastric polyps, gastritis   GIVENS CAPSULE STUDY N/A 06/23/2016   Dr. Oneida Alar: mild ileitis   MELANOMA SURGERY Right    POLYPECTOMY  05/28/2016   Procedure: POLYPECTOMY;  Surgeon: Danie Binder, MD;  [facility];  Service: Endoscopy;;  ascending colon    POLYPECTOMY  11/30/2021   Procedure: POLYPECTOMY INTESTINAL;  Surgeon: Eloise Harman, DO;  [facility];  Service: Endoscopy;;   SKIN CANCER EXCISION     Basal cell removal from left side of face   TONSILLECTOMY      Current Outpatient Medications  Medication Sig Dispense Refill   Ascorbic Acid (VITAMIN C) 1000 MG tablet Take 1,000 mg by mouth daily.     aspirin 81 MG tablet Take 81 mg by mouth at bedtime.     atorvastatin (LIPITOR) 40 MG tablet Take 40 mg by mouth daily.     Cholecalciferol (VITAMIN D3) 2000 units TABS Take 2,000 Units by mouth daily.     dapagliflozin propanediol (FARXIGA) 10 MG TABS tablet Take 10 mg by mouth daily.     diltiazem (CARDIZEM CD) 240 MG 24 hr capsule TAKE 1 CAPSULE BY MOUTH  DAILY 90 capsule 3   fluticasone (FLONASE) 50 MCG/ACT nasal spray Place 2 sprays  into both nostrils as needed for allergies or rhinitis.     furosemide (LASIX) 40 MG tablet Take 40 mg by mouth daily. Additional 40 me if needed for fluid     irbesartan (AVAPRO) 150 MG tablet Take 150 mg by mouth daily.     LANTUS SOLOSTAR 100 UNIT/ML Solostar Pen Inject 20 Units into the skin at bedtime.     loratadine (CLARITIN) 10 MG tablet Take 10 mg by mouth daily.     magnesium oxide (MAG-OX) 400 (240 Mg) MG tablet Take 400 mg by mouth daily.     metFORMIN (GLUCOPHAGE) 1000 MG tablet Take 1,000 mg by mouth 2 (two) times daily.     Multiple Vitamins-Minerals (PRESERVISION AREDS 2) CAPS Take 1 capsule by mouth daily.     omeprazole (PRILOSEC) 20 MG capsule Take 20-40 mg by mouth daily.     PARoxetine (PAXIL) 10 MG tablet Take 0.5 tablets by  mouth daily.     zinc gluconate 50 MG tablet Take 50 mg by mouth daily.     Black Pepper-Turmeric (TURMERIC CURCUMIN) 08-998 MG CAPS Take 1 capsule by mouth daily. (Patient not taking: Reported on 12/16/2021)     No current facility-administered medications for this visit.   Allergies:  Cefdinir, Nsaids, and Sulfa antibiotics   ROS: No orthopnea or PND.  Physical Exam: VS:  BP (!) 122/50 (BP Location: Left Arm, Patient Position: Sitting, Cuff Size: Large)   Pulse 67   Ht '5\' 1"'$  (1.549 m)   Wt 176 lb (79.8 kg)   SpO2 94%   BMI 33.25 kg/m , BMI Body mass index is 33.25 kg/m.  Wt Readings from Last 3 Encounters:  12/16/21 176 lb (79.8 kg)  11/25/21 182 lb 12.2 oz (82.9 kg)  10/08/21 182 lb 12.8 oz (82.9 kg)    General: Patient appears comfortable at rest. HEENT: Conjunctiva and lids normal. Neck: Supple, no elevated JVP or carotid bruits, no thyromegaly. Lungs: Clear to auscultation, nonlabored breathing at rest. Cardiac: Regular rate and rhythm, no S3, 3/6 systolic murmur. Extremities: No pitting edema.  ECG:  An ECG dated 11/25/2021 was personally reviewed today and demonstrated:  Sinus rhythm with left bundle branch block.  Recent Labwork: 01/13/2021: ALT 15; AST 17 08/25/2021: Hemoglobin 12.1; Platelets 252 10/12/2021: BUN 16; Creatinine, Ser 0.70; Potassium 4.9; Sodium 143   Other Studies Reviewed Today:  Echocardiogram 02/01/2018: Study Conclusions   - Left ventricle: The cavity size was normal. Wall thickness was   normal. Systolic function was normal. The estimated ejection   fraction was in the range of 55% to 60%. Wall motion was normal;   there were no regional wall motion abnormalities. Left   ventricular diastolic function parameters were normal for the   patient&'s age. - Ventricular septum: Septal motion showed dyssynergy consistent   with left bundle branch block. - Aortic valve: Mildly calcified annulus. Trileaflet. - Mitral valve: Mildly calcified annulus.  There was mild   regurgitation. - Right atrium: Central venous pressure (est): 3 mm Hg. - Atrial septum: No defect or patent foramen ovale was identified. - Tricuspid valve: There was trivial regurgitation. - Pulmonary arteries: PA peak pressure: 22 mm Hg (S). - Pericardium, extracardiac: A small pericardial effusion was   identified posterior to the heart.  Assessment and Plan:  1.  History of PSVT, doing well without obvious recurrence on Cardizem CD.  I reviewed her recent ECG.  Continue observation.  2.  Cardiac murmur with previously documented valvular calcification but only mild  mitral vegetation.  Murmur is more prominent and we will obtain a follow-up echocardiogram for surveillance.  Medication Adjustments/Labs and Tests Ordered: Current medicines are reviewed at length with the patient today.  Concerns regarding medicines are outlined above.   Tests Ordered: Orders Placed This Encounter  Procedures   ECHOCARDIOGRAM COMPLETE    Medication Changes: No orders of the defined types were placed in this encounter.   Disposition:  Follow up  1 year.  Signed, Satira Sark, MD, [facility] 12/16/2021 4:07 PM    [facility] [place], [place], [place] [address] Phone: [phone]; Fax: [phone]

## 2021-12-17 ENCOUNTER — Ambulatory Visit: Payer: Medicare Other | Attending: Cardiology

## 2021-12-17 DIAGNOSIS — R197 Diarrhea, unspecified: Secondary | ICD-10-CM | POA: Diagnosis not present

## 2021-12-17 DIAGNOSIS — R011 Cardiac murmur, unspecified: Secondary | ICD-10-CM | POA: Diagnosis not present

## 2021-12-17 LAB — ECHOCARDIOGRAM COMPLETE
AR max vel: 1.57 cm2
AV Peak grad: 14.9 mmHg
Ao pk vel: 1.93 m/s
Area-P 1/2: 2.87 cm2
Calc EF: 69.9 %
MV M vel: 4.28 m/s
MV Peak grad: 73.3 mmHg
S' Lateral: 3.25 cm
Single Plane A2C EF: 79.4 %
Single Plane A4C EF: 60.9 %

## 2022-01-09 DIAGNOSIS — I10 Essential (primary) hypertension: Secondary | ICD-10-CM | POA: Diagnosis not present

## 2022-01-09 DIAGNOSIS — E782 Mixed hyperlipidemia: Secondary | ICD-10-CM | POA: Diagnosis not present

## 2022-01-11 ENCOUNTER — Ambulatory Visit: Payer: Medicare Other | Admitting: Cardiology

## 2022-01-18 DIAGNOSIS — E875 Hyperkalemia: Secondary | ICD-10-CM | POA: Diagnosis not present

## 2022-01-18 DIAGNOSIS — E782 Mixed hyperlipidemia: Secondary | ICD-10-CM | POA: Diagnosis not present

## 2022-01-18 DIAGNOSIS — E559 Vitamin D deficiency, unspecified: Secondary | ICD-10-CM | POA: Diagnosis not present

## 2022-01-18 DIAGNOSIS — I10 Essential (primary) hypertension: Secondary | ICD-10-CM | POA: Diagnosis not present

## 2022-01-18 DIAGNOSIS — R5383 Other fatigue: Secondary | ICD-10-CM | POA: Diagnosis not present

## 2022-01-18 DIAGNOSIS — E1165 Type 2 diabetes mellitus with hyperglycemia: Secondary | ICD-10-CM | POA: Diagnosis not present

## 2022-01-18 DIAGNOSIS — D649 Anemia, unspecified: Secondary | ICD-10-CM | POA: Diagnosis not present

## 2022-01-18 DIAGNOSIS — E7849 Other hyperlipidemia: Secondary | ICD-10-CM | POA: Diagnosis not present

## 2022-01-18 DIAGNOSIS — D509 Iron deficiency anemia, unspecified: Secondary | ICD-10-CM | POA: Diagnosis not present

## 2022-01-21 DIAGNOSIS — I471 Supraventricular tachycardia, unspecified: Secondary | ICD-10-CM | POA: Diagnosis not present

## 2022-01-21 DIAGNOSIS — E1165 Type 2 diabetes mellitus with hyperglycemia: Secondary | ICD-10-CM | POA: Diagnosis not present

## 2022-01-21 DIAGNOSIS — I10 Essential (primary) hypertension: Secondary | ICD-10-CM | POA: Diagnosis not present

## 2022-01-21 DIAGNOSIS — E870 Hyperosmolality and hypernatremia: Secondary | ICD-10-CM | POA: Diagnosis not present

## 2022-01-21 DIAGNOSIS — Z23 Encounter for immunization: Secondary | ICD-10-CM | POA: Diagnosis not present

## 2022-01-21 DIAGNOSIS — B3731 Acute candidiasis of vulva and vagina: Secondary | ICD-10-CM | POA: Diagnosis not present

## 2022-01-21 DIAGNOSIS — D500 Iron deficiency anemia secondary to blood loss (chronic): Secondary | ICD-10-CM | POA: Diagnosis not present

## 2022-01-21 DIAGNOSIS — D126 Benign neoplasm of colon, unspecified: Secondary | ICD-10-CM | POA: Diagnosis not present

## 2022-01-21 DIAGNOSIS — R030 Elevated blood-pressure reading, without diagnosis of hypertension: Secondary | ICD-10-CM | POA: Diagnosis not present

## 2022-01-21 DIAGNOSIS — E7849 Other hyperlipidemia: Secondary | ICD-10-CM | POA: Diagnosis not present

## 2022-02-04 ENCOUNTER — Other Ambulatory Visit: Payer: Self-pay | Admitting: Cardiology

## 2022-02-08 NOTE — H&P (View-Only) (Signed)
Referring Provider: Curlene Labrum, MD Primary Care Physician:  Curlene Labrum, MD Primary GI Physician: Dr. Abbey Chatters  Chief Complaint  Patient presents with   Gastroesophageal Reflux    Bad acid reflux, worse at night.     HPI:   Kristina Berry is a 76 y.o. female history of GERD, adenomatous colon polyps, IDA s/p EGD, colonoscopy, capsule study completed in 2018, found to have 8 mm tubular adenoma in the colon, multiple benign-appearing gastric polyps, gastritis, and mild ileitis on capsule study. Most recent colonoscopy 11/30/2021 with nonbleeding internal hemorrhoids, diverticulosis in sigmoid colon, 5 mm tubular adenoma.  No recommendations to repeat colonoscopy due to age. She has been following with hematology for IDA who noted trouble with iron absorption in the past requiring IV iron in 2021. Her hemoglobin has been stable for the last 2+ years in the 12 range. She is presenting today with chief complaint of acid reflux.   Today:  Having trouble with indigestion, increased belching, some throat burning. Some burning radiating to her back at times. Worse at night. Nausea in the evenings. Unable to eat dinner. Seems to do ok with breakfast and lunch most of the time, but gets a couple bites into dinner and feels she is going to vomit though she doesn't. Also with early satiety and weight loss.  Down 15 lbs since June 2023.   No abdominal pain. Some bloating. Had been taking omeprazole 20 mg in the morning. PCP increased this to twice daily, takes her second dose around 2-3 pm. No significant improvement in symptoms.   Spicy foods cause worse symptoms.  Pepto bismol seems to help some.   No dysphagia.  No brbpr or melena.  Takes tylenol. Rare use of Aleve.   Symptoms started when she started ozempic about 9 months ago. Was on it for 3 months, but has been off for about 6 months, but symptoms have persisted.   H pylori stool antigen negative in September.   Past Medical  History:  Diagnosis Date   Anemia    Anxiety    Baker's cyst of knee    Chronic low back pain    Diastolic dysfunction    Essential hypertension    Family history of breast cancer    Family history of melanoma    Family history of stomach cancer    Family history of uterine cancer    GERD (gastroesophageal reflux disease)    Hyperlipidemia    Iron deficiency anemia 05/18/2016   LBBB (left bundle branch block)    Melanoma (HCC)    Mitral regurgitation    Mild to moderate 2011   Skin cancer    basal cell removed from face/melanoma removed from rt arm   Type 2 diabetes mellitus [facility])     Past Surgical History:  Procedure Laterality Date   ADENOIDECTOMY     BACK SURGERY  05/2014   BIOPSY  05/28/2016   Procedure: BIOPSY;  Surgeon: Danie Binder, MD;  [facility];  Service: Endoscopy;;  duodenal and gastric   CHOLECYSTECTOMY  2009   COLONOSCOPY N/A 05/28/2016   Dr. Oneida Alar: one 8 mm tubular adenomas, diverticulosis in recto-sigmoid, sigmoid, and descending. Colonoscopy in 5-10 years if benefits outweigh the risks   COLONOSCOPY WITH PROPOFOL N/A 11/30/2021   Surgeon: Hurshel Keys K, DO; nonbleeding internal hemorrhoids, diverticulosis in sigmoid colon, 5 mm tubular adenoma.  No recommendations to repeat colonoscopy due to age.   ESOPHAGOGASTRODUODENOSCOPY N/A 05/28/2016  Dr. Oneida Alar: multiple benign-appearing gastric polyps, gastritis   GIVENS CAPSULE STUDY N/A 06/23/2016   Dr. Oneida Alar: mild ileitis   MELANOMA SURGERY Right    POLYPECTOMY  05/28/2016   Procedure: POLYPECTOMY;  Surgeon: Danie Binder, MD;  [facility];  Service: Endoscopy;;  ascending colon    POLYPECTOMY  11/30/2021   Procedure: POLYPECTOMY INTESTINAL;  Surgeon: Eloise Harman, DO;  [facility];  Service: Endoscopy;;   SKIN CANCER EXCISION     Basal cell removal from left side of face   TONSILLECTOMY      Current Outpatient Medications  Medication Sig Dispense Refill    Ascorbic Acid (VITAMIN C) 1000 MG tablet Take 1,000 mg by mouth daily.     aspirin 81 MG tablet Take 81 mg by mouth at bedtime.     atorvastatin (LIPITOR) 40 MG tablet Take 40 mg by mouth daily.     Cholecalciferol (VITAMIN D3) 2000 units TABS Take 2,000 Units by mouth daily.     dapagliflozin propanediol (FARXIGA) 10 MG TABS tablet Take 10 mg by mouth daily.     diltiazem (CARDIZEM CD) 240 MG 24 hr capsule TAKE 1 CAPSULE BY MOUTH DAILY 90 capsule 3   fluticasone (FLONASE) 50 MCG/ACT nasal spray Place 2 sprays into both nostrils as needed for allergies or rhinitis.     furosemide (LASIX) 40 MG tablet Take 40 mg by mouth daily. Additional 40 me if needed for fluid     irbesartan (AVAPRO) 150 MG tablet Take 150 mg by mouth daily.     LANTUS SOLOSTAR 100 UNIT/ML Solostar Pen Inject 20 Units into the skin at bedtime.     loratadine (CLARITIN) 10 MG tablet Take 10 mg by mouth daily.     magnesium oxide (MAG-OX) 400 (240 Mg) MG tablet Take 400 mg by mouth daily.     metFORMIN (GLUCOPHAGE) 1000 MG tablet Take 1,000 mg by mouth 2 (two) times daily.     Multiple Vitamins-Minerals (PRESERVISION AREDS 2) CAPS Take 1 capsule by mouth daily.     pantoprazole (PROTONIX) 40 MG tablet Take 1 tablet (40 mg total) by mouth 2 (two) times daily before a meal. 60 tablet 3   PARoxetine (PAXIL) 10 MG tablet Take 0.5 tablets by mouth daily.     zinc gluconate 50 MG tablet Take 50 mg by mouth daily.     Black Pepper-Turmeric (TURMERIC CURCUMIN) 08-998 MG CAPS Take 1 capsule by mouth daily. (Patient not taking: Reported on 12/16/2021)     No current facility-administered medications for this visit.    Allergies as of 02/09/2022 - Review Complete 12/16/2021  Allergen Reaction Noted   Cefdinir Other (See Comments) 10/24/2015   Nsaids Other (See Comments) 10/24/2015   Sulfa antibiotics Nausea Only 10/24/2015    Family History  Problem Relation Age of Onset   Osteoporosis Mother    Dementia Mother    Diabetes  Mother    Melanoma Mother        dx in her 30s   Breast cancer Sister 77       triple negative   Heart Problems Father    Diabetes Father    Breast cancer Sister 72   Learning disabilities Sister    COPD Sister    Lung cancer Sister    Breast cancer Maternal Aunt 48   Stomach cancer Maternal Uncle    Uterine cancer Other 61   Colon cancer Neg Hx     Social History  Socioeconomic History   Marital status: Married    Spouse name: Not on file   Number of children: Not on file   Years of education: Not on file   Highest education level: Not on file  Occupational History   Not on file  Tobacco Use   Smoking status: Never    Passive exposure: Never   Smokeless tobacco: Never  Vaping Use   Vaping Use: Never used  Substance and Sexual Activity   Alcohol use: No    Alcohol/week: 0.0 standard drinks of alcohol   Drug use: No   Sexual activity: Not Currently    Comment: married  Other Topics Concern   Not on file  Social History Narrative   Not on file   Social Determinants of Health   Financial Resource Strain: Not on file  Food Insecurity: Not on file  Transportation Needs: Not on file  Physical Activity: Not on file  Stress: Not on file  Social Connections: Not on file    Review of Systems: Gen: Denies fever, chills, cold or flu like symptoms, pre-syncope, or syncope.   CV: Denies chest pain, palpitations. Resp: Denies dyspnea, cough.  GI: See HPI Heme: See HPI  Physical Exam: BP 136/62 (BP Location: Right Arm, Patient Position: Sitting, Cuff Size: Large)   Pulse 68   Temp 97.6 F (36.4 C) (Temporal)   Ht '5\' 1"'$  (1.549 m)   Wt 167 lb 6.4 oz (75.9 kg)   SpO2 96%   BMI 31.63 kg/m  General:   Alert and oriented. No distress noted. Pleasant and cooperative.  Head:  Normocephalic and atraumatic. Eyes:  Conjuctiva clear without scleral icterus. Heart:  S1, S2 present without murmurs appreciated. Lungs:  Clear to auscultation bilaterally. No wheezes, rales,  or rhonchi. No distress.  Abdomen:  +BS, soft, non-tender and non-distended. No rebound or guarding. No HSM or masses noted. Msk:  Symmetrical without gross deformities. Normal posture. Extremities:  Without edema. Neurologic:  Alert and  oriented x4 Psych:  Normal mood and affect.    Assessment:  76 y.o. female history of HTN, HLD, T2DM, LBBB, mitral regurgitation, GERD, adenomatous colon polyps, IDA previously, presenting today with chief complaint of uncontrolled GERD, early satiety, nausea, and weight loss. She reports symptoms started about 9 months ago after starting Ozempic, but have continued despite discontinuing Ozempic about 6 months ago. Omeprazole 20 mg was increased to twice daily without any real improvement in her symptoms,  though she is taking the second dose around 2-3 pm. Continues with frequent belching, burning in her throat intermittently and in her back, nausea in the evening preventing her from eating dinner. She has documented 15 lb unintentional weight loss in the last 4 months. No brbpr, melena, abdominal pain, dysphagia, or routine NSAID use. H pylori stool antigen negative in September with PCP.   While her symptoms may be secondary to uncontrolled GERD, I have recommended EGD in light of early satiety and weight loss to evaluate for gastritis, duodenitis, PUD, malignancy. She could also have gastroparesis in the setting of diabetes. We will also change her PPI to see if this will help with symptoms while waiting on EGD.    Plan:  Proceed with upper endoscopy with propofol by Dr. Abbey Chatters in near future. The risks, benefits, and alternatives have been discussed with the patient in detail. The patient states understanding and desires to proceed.  ASA 2 1/2 dose insulin evening prior. Hold Farxiga x 72 hours prior Stop omeprazole Start Pantoprazole  40 mg BID. Counseled on GERD diet/lifestyle. Written instructions provided on AVS.  Offered Zofran, but patient declined.   Follow-up after EGD.    Aliene Altes, PA-C [facility] 02/09/2022

## 2022-02-08 NOTE — Progress Notes (Unsigned)
Referring Provider: Curlene Labrum, MD Primary Care Physician:  Curlene Labrum, MD Primary GI Physician: Dr. Abbey Chatters  No chief complaint on file.   HPI:   Kristina Berry is a 76 y.o. female history of IDA s/p EGD, colonoscopy, capsule study completed in 2018, found to have 8 mm tubular adenoma in the colon, multiple benign-appearing gastric polyps, gastritis, and mild ileitis on capsule study. She has been following with hematology for IDA who noted trouble with iron absorption in the past requiring IV iron in 2021. Her hemoglobin has been stable for the last 2+ years in the 12 range. She is presenting today for ***   Last seen in our office 10/08/2021 reporting new onset diarrhea after starting Wilder Glade and being on a course of antibiotics for UTI about 1 month prior.  She was having 4 bowel movements daily that could be mushy to watery.  No BRBPR or melena.  Previously with 1-2 bowel movements daily.  No other significant GI symptoms.  Chronic reflux well controlled on omeprazole 20 mg daily.  Patient reported she tested positive for a gene that increase her risk for colon cancer.  Reviewed genetic testing completed in 2019.  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 MUTYH gene is associated with autosomal recessive MUTYH-associated polyposis; however, she is a carrier and this result is insufficient to cause autosomal recessive MAP. Recommended checking stool studies and proceeding with a colonoscopy.  Stool studies completed 10/12/2021. GI profile panel detected Yersinia enterocolitica. C diff is negative.  Patient reported her diarrhea was starting to slack off and as Yersinia enterocolitica was typically a self-limiting illness, recommended continuing to monitor for few more weeks.  Patient called 8/1 stating her diarrhea had resolved.  Recommended proceeding with colonoscopy.  Colonoscopy 11/30/2021 with nonbleeding internal hemorrhoids, diverticulosis  in sigmoid colon, 5 mm tubular adenoma.  No recommendations to repeat colonoscopy due to age.  Today:    Past Medical History:  Diagnosis Date   Anemia    Anxiety    Baker's cyst of knee    Chronic low back pain    Diastolic dysfunction    Essential hypertension    Family history of breast cancer    Family history of melanoma    Family history of stomach cancer    Family history of uterine cancer    GERD (gastroesophageal reflux disease)    Hyperlipidemia    Iron deficiency anemia 05/18/2016   LBBB (left bundle branch block)    Melanoma (HCC)    Mitral regurgitation    Mild to moderate 2011   Skin cancer    basal cell removed from face/melanoma removed from rt arm   Type 2 diabetes mellitus [facility])     Past Surgical History:  Procedure Laterality Date   ADENOIDECTOMY     BACK SURGERY  05/2014   BIOPSY  05/28/2016   Procedure: BIOPSY;  Surgeon: Danie Binder, MD;  [facility];  Service: Endoscopy;;  duodenal and gastric   CHOLECYSTECTOMY  2009   COLONOSCOPY N/A 05/28/2016   Dr. Oneida Alar: one 8 mm tubular adenomas, diverticulosis in recto-sigmoid, sigmoid, and descending. Colonoscopy in 5-10 years if benefits outweigh the risks   COLONOSCOPY WITH PROPOFOL N/A 11/30/2021   Procedure: COLONOSCOPY WITH PROPOFOL;  Surgeon: Eloise Harman, DO;  [facility]: [facility];  Service: Endoscopy;  Laterality: N/A;  9:00am   ESOPHAGOGASTRODUODENOSCOPY N/A 05/28/2016   Dr. Oneida Alar: multiple benign-appearing gastric polyps,  gastritis   GIVENS CAPSULE STUDY N/A 06/23/2016   Dr. Oneida Alar: mild ileitis   MELANOMA SURGERY Right    POLYPECTOMY  05/28/2016   Procedure: POLYPECTOMY;  Surgeon: Danie Binder, MD;  [facility];  Service: Endoscopy;;  ascending colon    POLYPECTOMY  11/30/2021   Procedure: POLYPECTOMY INTESTINAL;  Surgeon: Eloise Harman, DO;  [facility];  Service: Endoscopy;;   SKIN CANCER EXCISION     Basal cell removal from left side of face    TONSILLECTOMY      Current Outpatient Medications  Medication Sig Dispense Refill   Ascorbic Acid (VITAMIN C) 1000 MG tablet Take 1,000 mg by mouth daily.     aspirin 81 MG tablet Take 81 mg by mouth at bedtime.     atorvastatin (LIPITOR) 40 MG tablet Take 40 mg by mouth daily.     Black Pepper-Turmeric (TURMERIC CURCUMIN) 08-998 MG CAPS Take 1 capsule by mouth daily. (Patient not taking: Reported on 12/16/2021)     Cholecalciferol (VITAMIN D3) 2000 units TABS Take 2,000 Units by mouth daily.     dapagliflozin propanediol (FARXIGA) 10 MG TABS tablet Take 10 mg by mouth daily.     diltiazem (CARDIZEM CD) 240 MG 24 hr capsule TAKE 1 CAPSULE BY MOUTH DAILY 90 capsule 3   fluticasone (FLONASE) 50 MCG/ACT nasal spray Place 2 sprays into both nostrils as needed for allergies or rhinitis.     furosemide (LASIX) 40 MG tablet Take 40 mg by mouth daily. Additional 40 me if needed for fluid     irbesartan (AVAPRO) 150 MG tablet Take 150 mg by mouth daily.     LANTUS SOLOSTAR 100 UNIT/ML Solostar Pen Inject 20 Units into the skin at bedtime.     loratadine (CLARITIN) 10 MG tablet Take 10 mg by mouth daily.     magnesium oxide (MAG-OX) 400 (240 Mg) MG tablet Take 400 mg by mouth daily.     metFORMIN (GLUCOPHAGE) 1000 MG tablet Take 1,000 mg by mouth 2 (two) times daily.     Multiple Vitamins-Minerals (PRESERVISION AREDS 2) CAPS Take 1 capsule by mouth daily.     omeprazole (PRILOSEC) 20 MG capsule Take 20-40 mg by mouth daily.     PARoxetine (PAXIL) 10 MG tablet Take 0.5 tablets by mouth daily.     zinc gluconate 50 MG tablet Take 50 mg by mouth daily.     No current facility-administered medications for this visit.    Allergies as of 02/09/2022 - Review Complete 12/16/2021  Allergen Reaction Noted   Cefdinir Other (See Comments) 10/24/2015   Nsaids Other (See Comments) 10/24/2015   Sulfa antibiotics Nausea Only 10/24/2015    Family History  Problem Relation Age of Onset   Osteoporosis Mother     Dementia Mother    Diabetes Mother    Melanoma Mother        dx in her 39s   Breast cancer Sister 87       triple negative   Heart Problems Father    Diabetes Father    Breast cancer Sister 53   Learning disabilities Sister    COPD Sister    Lung cancer Sister    Breast cancer Maternal Aunt 48   Stomach cancer Maternal Uncle    Uterine cancer Other 33   Colon cancer Neg Hx     Social History   Socioeconomic History   Marital status: Married    Spouse name: Not on file  Number of children: Not on file   Years of education: Not on file   Highest education level: Not on file  Occupational History   Not on file  Tobacco Use   Smoking status: Never    Passive exposure: Never   Smokeless tobacco: Never  Vaping Use   Vaping Use: Never used  Substance and Sexual Activity   Alcohol use: No    Alcohol/week: 0.0 standard drinks of alcohol   Drug use: No   Sexual activity: Not Currently    Comment: married  Other Topics Concern   Not on file  Social History Narrative   Not on file   Social Determinants of Health   Financial Resource Strain: Not on file  Food Insecurity: Not on file  Transportation Needs: Not on file  Physical Activity: Not on file  Stress: Not on file  Social Connections: Not on file    Review of Systems: Gen: Denies fever, chills, cold or flu like symptoms, pre-syncope, or syncope.   CV: Denies chest pain, palpitations. Resp: Denies dyspnea, cough.  GI: See HPI Heme: See HPI  Physical Exam: There were no vitals taken for this visit. General:   Alert and oriented. No distress noted. Pleasant and cooperative.  Head:  Normocephalic and atraumatic. Eyes:  Conjuctiva clear without scleral icterus. Heart:  S1, S2 present without murmurs appreciated. Lungs:  Clear to auscultation bilaterally. No wheezes, rales, or rhonchi. No distress.  Abdomen:  +BS, soft, non-tender and non-distended. No rebound or guarding. No HSM or masses noted. Msk:   Symmetrical without gross deformities. Normal posture. Extremities:  Without edema. Neurologic:  Alert and  oriented x4 Psych:  Normal mood and affect.    Assessment:     Plan:  ***   Aliene Altes, PA-C [facility]enter[facility] 02/09/2022

## 2022-02-09 ENCOUNTER — Ambulatory Visit: Payer: Medicare Other | Admitting: Gastroenterology

## 2022-02-09 ENCOUNTER — Telehealth: Payer: Self-pay | Admitting: *Deleted

## 2022-02-09 ENCOUNTER — Encounter: Payer: Self-pay | Admitting: Gastroenterology

## 2022-02-09 ENCOUNTER — Encounter: Payer: Self-pay | Admitting: *Deleted

## 2022-02-09 VITALS — BP 136/62 | HR 68 | Temp 97.6°F | Ht 61.0 in | Wt 167.4 lb

## 2022-02-09 DIAGNOSIS — K219 Gastro-esophageal reflux disease without esophagitis: Secondary | ICD-10-CM | POA: Diagnosis not present

## 2022-02-09 DIAGNOSIS — R6881 Early satiety: Secondary | ICD-10-CM | POA: Insufficient documentation

## 2022-02-09 DIAGNOSIS — R634 Abnormal weight loss: Secondary | ICD-10-CM

## 2022-02-09 DIAGNOSIS — R110 Nausea: Secondary | ICD-10-CM | POA: Diagnosis not present

## 2022-02-09 MED ORDER — PANTOPRAZOLE SODIUM 40 MG PO TBEC: 40.0000 mg | DELAYED_RELEASE_TABLET | Freq: Two times a day (BID) | ORAL | 3 refills | Status: DC

## 2022-02-09 NOTE — Telephone Encounter (Signed)
PA approved via [facility]. Auth# [id number], DOS: Feb 18, 2022 - Apr 11, 2022

## 2022-02-09 NOTE — Telephone Encounter (Signed)
Called pt. Made aware she needs to hold farxiga 3 days before procedure and she will now need to be on a clear liquid diet the entire day prior to procedure. She voiced understanding.

## 2022-02-09 NOTE — Patient Instructions (Signed)
Stop omeprazole and start pantoprazole 40 mg twice daily 30 minutes before breakfast and dinner.  Follow a GERD diet:  Avoid fried, fatty, greasy, spicy, citrus foods. Avoid caffeine and carbonated beverages. Avoid chocolate. Try eating 4-6 small meals a day rather than 3 large meals. Do not eat within 3 hours of laying down. Prop head of bed up on wood or bricks to create a 6 inch incline.  We will arrange to have an upper endoscopy in the near future with Dr. Abbey Chatters. The evening before your procedure, take one half dose of Lantus.  You may take your other diabetes medications as prescribed. Day of your procedure, do not take any morning diabetes medications.  We will follow-up with you in the office after your upper endoscopy.  Do not hesitate to call sooner if you have questions or concerns.  It was good to see you again today!  I am sorry you are not feeling well!  Aliene Altes, PA-C [facility]

## 2022-02-10 DIAGNOSIS — H353132 Nonexudative age-related macular degeneration, bilateral, intermediate dry stage: Secondary | ICD-10-CM | POA: Diagnosis not present

## 2022-02-10 DIAGNOSIS — H524 Presbyopia: Secondary | ICD-10-CM | POA: Diagnosis not present

## 2022-02-10 LAB — BASIC METABOLIC PANEL
BUN/Creatinine Ratio: 16 (ref 12–28)
BUN: 10 mg/dL (ref 8–27)
CO2: 27 mmol/L (ref 20–29)
Calcium: 10.2 mg/dL (ref 8.7–10.3)
Chloride: 101 mmol/L (ref 96–106)
Creatinine, Ser: 0.62 mg/dL (ref 0.57–1.00)
Glucose: 186 mg/dL — ABNORMAL HIGH (ref 70–99)
Potassium: 5.3 mmol/L — ABNORMAL HIGH (ref 3.5–5.2)
Sodium: 142 mmol/L (ref 134–144)
eGFR: 92 mL/min/{1.73_m2} (ref >59)

## 2022-02-18 ENCOUNTER — Other Ambulatory Visit: Payer: Self-pay

## 2022-02-18 ENCOUNTER — Encounter (HOSPITAL_COMMUNITY)
Admission: RE | Disposition: A | Discharge status: Home / Self Care | Payer: Self-pay | Source: Home / Self Care | Attending: Internal Medicine

## 2022-02-18 ENCOUNTER — Ambulatory Visit (HOSPITAL_COMMUNITY): Payer: Medicare Other | Admitting: Anesthesiology

## 2022-02-18 ENCOUNTER — Ambulatory Visit (HOSPITAL_BASED_OUTPATIENT_CLINIC_OR_DEPARTMENT_OTHER): Payer: Medicare Other | Admitting: Anesthesiology

## 2022-02-18 ENCOUNTER — Ambulatory Visit (HOSPITAL_COMMUNITY)
Admission: RE | Admit: 2022-02-18 | Discharge: 2022-02-18 | Disposition: A | Discharge status: Home / Self Care | Payer: Medicare Other | Attending: Internal Medicine | Admitting: Internal Medicine

## 2022-02-18 DIAGNOSIS — K2970 Gastritis, unspecified, without bleeding: Secondary | ICD-10-CM

## 2022-02-18 DIAGNOSIS — D649 Anemia, unspecified: Secondary | ICD-10-CM | POA: Insufficient documentation

## 2022-02-18 DIAGNOSIS — I10 Essential (primary) hypertension: Secondary | ICD-10-CM

## 2022-02-18 DIAGNOSIS — R110 Nausea: Secondary | ICD-10-CM | POA: Diagnosis not present

## 2022-02-18 DIAGNOSIS — I517 Cardiomegaly: Secondary | ICD-10-CM | POA: Diagnosis not present

## 2022-02-18 DIAGNOSIS — F419 Anxiety disorder, unspecified: Secondary | ICD-10-CM

## 2022-02-18 DIAGNOSIS — R634 Abnormal weight loss: Secondary | ICD-10-CM | POA: Diagnosis not present

## 2022-02-18 DIAGNOSIS — R12 Heartburn: Secondary | ICD-10-CM | POA: Diagnosis not present

## 2022-02-18 DIAGNOSIS — R6881 Early satiety: Secondary | ICD-10-CM

## 2022-02-18 DIAGNOSIS — I3139 Other pericardial effusion (noninflammatory): Secondary | ICD-10-CM | POA: Diagnosis not present

## 2022-02-18 DIAGNOSIS — K219 Gastro-esophageal reflux disease without esophagitis: Secondary | ICD-10-CM | POA: Insufficient documentation

## 2022-02-18 DIAGNOSIS — I080 Rheumatic disorders of both mitral and aortic valves: Secondary | ICD-10-CM | POA: Insufficient documentation

## 2022-02-18 DIAGNOSIS — Z794 Long term (current) use of insulin: Secondary | ICD-10-CM | POA: Insufficient documentation

## 2022-02-18 DIAGNOSIS — Z7984 Long term (current) use of oral hypoglycemic drugs: Secondary | ICD-10-CM | POA: Insufficient documentation

## 2022-02-18 DIAGNOSIS — E119 Type 2 diabetes mellitus without complications: Secondary | ICD-10-CM | POA: Diagnosis not present

## 2022-02-18 DIAGNOSIS — I340 Nonrheumatic mitral (valve) insufficiency: Secondary | ICD-10-CM | POA: Diagnosis not present

## 2022-02-18 HISTORY — PX: BIOPSY: SHX5522

## 2022-02-18 HISTORY — PX: ESOPHAGOGASTRODUODENOSCOPY (EGD) WITH PROPOFOL: SHX5813

## 2022-02-18 LAB — GLUCOSE, CAPILLARY: Glucose-Capillary: 133 mg/dL — ABNORMAL HIGH (ref 70–99)

## 2022-02-18 SURGERY — ESOPHAGOGASTRODUODENOSCOPY (EGD) WITH PROPOFOL
Anesthesia: General

## 2022-02-18 MED ORDER — LACTATED RINGERS IV SOLN: INTRAVENOUS | Status: DC

## 2022-02-18 MED ORDER — PROPOFOL 10 MG/ML IV BOLUS
INTRAVENOUS | Status: DC | PRN
  Administered 2022-02-18 (×2): 40 mg via INTRAVENOUS

## 2022-02-18 NOTE — Interval H&P Note (Signed)
History and Physical Interval Note:  02/18/2022 10:10 AM  Kristina Berry  has presented today for surgery, with the diagnosis of early satiety, gerd. weight loss.  The various methods of treatment have been discussed with the patient and family. After consideration of risks, benefits and other options for treatment, the patient has consented to  Procedure(s) with comments: ESOPHAGOGASTRODUODENOSCOPY (EGD) WITH PROPOFOL (N/A) - 12:15pm, asa 2, pt knows to arrive at 8:45 as a surgical intervention.  The patient's history has been reviewed, patient examined, no change in status, stable for surgery.  I have reviewed the patient's chart and labs.  Questions were answered to the patient's satisfaction.     Eloise Harman

## 2022-02-18 NOTE — Anesthesia Postprocedure Evaluation (Signed)
Anesthesia Post Note  Patient: Kristina Berry  Procedure(s) Performed: ESOPHAGOGASTRODUODENOSCOPY (EGD) WITH PROPOFOL BIOPSY  Patient location during evaluation: Phase II Anesthesia Type: General Level of consciousness: awake and alert and oriented Pain management: pain level controlled Vital Signs Assessment: post-procedure vital signs reviewed and stable Respiratory status: spontaneous breathing, nonlabored ventilation and respiratory function stable Cardiovascular status: blood pressure returned to baseline and stable Postop Assessment: no apparent nausea or vomiting Anesthetic complications: no  No notable events documented.   Last Vitals:  Vitals:   02/18/22 0909 02/18/22 1032  BP: (!) 165/69 (!) 139/54  Pulse: 88 82  Resp: 20 (!) 22  Temp: 36.7 C 36.7 C  SpO2: 95% 97%    Last Pain:  Vitals:   02/18/22 1032  TempSrc: Oral  PainSc: 0-No pain                 Dayle Mcnerney C Dareon Nunziato

## 2022-02-18 NOTE — Op Note (Signed)
[facility] Patient Name: Kristina Berry Procedure Date: 02/18/2022 10:12 AM MRN: [id number] Date of Birth: 26-May-1945 Attending MD: Elon Alas. Abbey Chatters , [place], [id number] CSN: [id number] Age: 76 Admit Type: Outpatient Procedure:                Upper GI endoscopy Indications:              Heartburn, Early satiety, Nausea Providers:                Elon Alas. Abbey Chatters, DO, Janeece Riggers, RN, Everardo Pacific Referring MD:              Medicines:                See the Anesthesia note for documentation of the                            administered medications Complications:            No immediate complications. Estimated Blood Loss:     Estimated blood loss was minimal. Procedure:                Pre-Anesthesia Assessment:                           - The anesthesia plan was to use monitored                            anesthesia care (MAC).                           After obtaining informed consent, the endoscope was                            passed under direct vision. Throughout the                            procedure, the patient's blood pressure, pulse, and                            oxygen saturations were monitored continuously. The                            GIF-H190 ([id number]) scope was introduced through the                            mouth, and advanced to the second part of duodenum.                            The upper GI endoscopy was accomplished without                            difficulty. The patient tolerated the procedure                            well. Scope In: 10:25:55  AM Scope Out: 10:29:13 AM Total Procedure Duration: 0 hours 3 minutes 18 seconds  Findings:      The Z-line was regular and was found 38 cm from the incisors.      Patchy mild inflammation characterized by erythema was found in the       gastric body. Biopsies were taken with a cold forceps for Helicobacter       pylori testing.      The duodenal bulb, first portion  of the duodenum and second portion of       the duodenum were normal. Impression:               - Z-line regular, 38 cm from the incisors.                           - Gastritis. Biopsied.                           - Normal duodenal bulb, first portion of the                            duodenum and second portion of the duodenum. Moderate Sedation:      Per Anesthesia Care Recommendation:           - Patient has a contact number available for                            emergencies. The signs and symptoms of potential                            delayed complications were discussed with the                            patient. Return to normal activities tomorrow.                            Written discharge instructions were provided to the                            patient.                           - Resume previous diet.                           - Continue present medications.                           - Await pathology results.                           - Use Protonix (pantoprazole) 40 mg PO BID.                           - Return to GI clinic in 3 months. Procedure Code(s):        --- Professional ---  40459, Esophagogastroduodenoscopy, flexible,                            transoral; with biopsy, single or multiple Diagnosis Code(s):        --- Professional ---                           K29.70, Gastritis, unspecified, without bleeding                           R12, Heartburn                           R68.81, Early satiety                           R11.0, Nausea CPT copyright 2022 American Medical Association. All rights reserved. The codes documented in this report are preliminary and upon coder review may  be revised to meet current compliance requirements. Elon Alas. Abbey Chatters, DO [place] Abbey Chatters, DO 02/18/2022 10:33:22 AM This report has been signed electronically. Number of Addenda: 0

## 2022-02-18 NOTE — Transfer of Care (Signed)
Immediate Anesthesia Transfer of Care Note  Patient: Kristina Berry  Procedure(s) Performed: ESOPHAGOGASTRODUODENOSCOPY (EGD) WITH PROPOFOL BIOPSY  Patient Location: Short Stay  Anesthesia Type:General  Level of Consciousness: awake, alert , oriented, and patient cooperative  Airway & Oxygen Therapy: Patient Spontanous Breathing  Post-op Assessment: Report given to RN, Post -op Vital signs reviewed and stable, and Patient moving all extremities X 4  Post vital signs: Reviewed and stable  Last Vitals:  Vitals Value Taken Time  BP 139/54 02/18/22 1032  Temp 36.7 C 02/18/22 1032  Pulse 82 02/18/22 1032  Resp 22 02/18/22 1032  SpO2 97 % 02/18/22 1032    Last Pain:  Vitals:   02/18/22 1032  TempSrc: Oral  PainSc: 0-No pain      Patients Stated Pain Goal: 8 (26/83/41 9622)  Complications: No notable events documented.

## 2022-02-18 NOTE — Discharge Instructions (Addendum)
EGD Discharge instructions Please read the instructions outlined below and refer to this sheet in the next few weeks. These discharge instructions provide you with general information on caring for yourself after you leave the hospital. Your doctor may also give you specific instructions. While your treatment has been planned according to the most current medical practices available, unavoidable complications occasionally occur. If you have any problems or questions after discharge, please call your doctor. ACTIVITY You may resume your regular activity but move at a slower pace for the next 24 hours.  Take frequent rest periods for the next 24 hours.  Walking will help expel (get rid of) the air and reduce the bloated feeling in your abdomen.  No driving for 24 hours (because of the anesthesia (medicine) used during the test).  You may shower.  Do not sign any important legal documents or operate any machinery for 24 hours (because of the anesthesia used during the test).  NUTRITION Drink plenty of fluids.  You may resume your normal diet.  Begin with a light meal and progress to your normal diet.  Avoid alcoholic beverages for 24 hours or as instructed by your caregiver.  MEDICATIONS You may resume your normal medications unless your caregiver tells you otherwise.  WHAT YOU CAN EXPECT TODAY You may experience abdominal discomfort such as a feeling of fullness or "gas" pains.  FOLLOW-UP Your doctor will discuss the results of your test with you.  SEEK IMMEDIATE MEDICAL ATTENTION IF ANY OF THE FOLLOWING OCCUR: Excessive nausea (feeling sick to your stomach) and/or vomiting.  Severe abdominal pain and distention (swelling).  Trouble swallowing.  Temperature over 101 F (37.8 C).  Rectal bleeding or vomiting of blood.    Your EGD revealed mild amount inflammation in your stomach.  I took biopsies of this to rule out infection with a bacteria called H. pylori.  Await pathology results, my  office will contact you.  Your esophagus and small bowel appeared normal.  Continue on pantoprazole 40 mg twice daily.    Follow-up with GI in 3 months.   OFFICE WILL CONTACT YOU   I hope you have a great rest of your week!  Elon Alas. Abbey Chatters, D.O. Gastroenterology and Hepatology [facility]

## 2022-02-18 NOTE — Anesthesia Preprocedure Evaluation (Signed)
Anesthesia Evaluation  Patient identified by MRN, date of birth, ID band Patient awake    Reviewed: Allergy & Precautions, H&P , NPO status , Patient's Chart, lab work & pertinent test results  Airway Mallampati: II  TM Distance: >3 FB Neck ROM: Full    Dental  (+) Dental Advisory Given, Missing   Pulmonary neg pulmonary ROS   Pulmonary exam normal breath sounds clear to auscultation       Cardiovascular Exercise Tolerance: Good hypertension, Pt. on medications Normal cardiovascular exam+ dysrhythmias (LBBB) + Valvular Problems/Murmurs MR  Rhythm:Regular Rate:Normal   1. Left ventricular ejection fraction, by estimation, is 60 to 65%. The  left ventricle has normal function. The left ventricle has no regional  wall motion abnormalities. There is mild left ventricular hypertrophy.  Left ventricular diastolic parameters  are consistent with Grade I diastolic dysfunction (impaired relaxation).  The average left ventricular global longitudinal strain is -22.1 %. The  global longitudinal strain is normal.   2. Right ventricular systolic function is normal. The right ventricular  size is normal. There is normal pulmonary artery systolic pressure. The  estimated right ventricular systolic pressure is 03.4 mmHg.   3. Left atrial size was mildly dilated.   4. A small pericardial effusion is present. The pericardial effusion is  posterior to the left ventricle.   5. The mitral valve is grossly normal. Mild to moderate mitral valve  regurgitation.   6. The aortic valve is tricuspid. Aortic valve regurgitation is not  visualized. Aortic valve sclerosis/calcification is present, without any  evidence of aortic stenosis.   7. The inferior vena cava is normal in size with greater than 50%  respiratory variability, suggesting right atrial pressure of 3 mmHg.     Neuro/Psych  PSYCHIATRIC DISORDERS Anxiety     negative neurological ROS      GI/Hepatic Neg liver ROS,GERD  Medicated and Poorly Controlled,,  Endo/Other  negative endocrine ROSdiabetes, Well Controlled, Type 2, Oral Hypoglycemic Agents, Insulin Dependent    Renal/GU negative Renal ROS  negative genitourinary   Musculoskeletal negative musculoskeletal ROS (+)    Abdominal   Peds negative pediatric ROS (+)  Hematology  (+) Blood dyscrasia, anemia   Anesthesia Other Findings   Reproductive/Obstetrics negative OB ROS                             Anesthesia Physical Anesthesia Plan  ASA: 2  Anesthesia Plan: General   Post-op Pain Management: Minimal or no pain anticipated   Induction: Intravenous  PONV Risk Score and Plan: Propofol infusion  Airway Management Planned: Nasal Cannula and Natural Airway  Additional Equipment:   Intra-op Plan:   Post-operative Plan:   Informed Consent: I have reviewed the patients History and Physical, chart, labs and discussed the procedure including the risks, benefits and alternatives for the proposed anesthesia with the patient or authorized representative who has indicated his/her understanding and acceptance.     Dental advisory given  Plan Discussed with: CRNA and Surgeon  Anesthesia Plan Comments:        Anesthesia Quick Evaluation

## 2022-02-19 LAB — SURGICAL PATHOLOGY

## 2022-02-24 ENCOUNTER — Encounter (HOSPITAL_COMMUNITY): Payer: Self-pay | Admitting: Internal Medicine

## 2022-03-30 DIAGNOSIS — R030 Elevated blood-pressure reading, without diagnosis of hypertension: Secondary | ICD-10-CM | POA: Diagnosis not present

## 2022-03-30 DIAGNOSIS — I471 Supraventricular tachycardia, unspecified: Secondary | ICD-10-CM | POA: Diagnosis not present

## 2022-03-30 DIAGNOSIS — E1165 Type 2 diabetes mellitus with hyperglycemia: Secondary | ICD-10-CM | POA: Diagnosis not present

## 2022-04-19 DIAGNOSIS — K219 Gastro-esophageal reflux disease without esophagitis: Secondary | ICD-10-CM | POA: Diagnosis not present

## 2022-04-19 DIAGNOSIS — D649 Anemia, unspecified: Secondary | ICD-10-CM | POA: Diagnosis not present

## 2022-04-19 DIAGNOSIS — D509 Iron deficiency anemia, unspecified: Secondary | ICD-10-CM | POA: Diagnosis not present

## 2022-04-19 DIAGNOSIS — E1165 Type 2 diabetes mellitus with hyperglycemia: Secondary | ICD-10-CM | POA: Diagnosis not present

## 2022-04-19 DIAGNOSIS — E7849 Other hyperlipidemia: Secondary | ICD-10-CM | POA: Diagnosis not present

## 2022-04-19 DIAGNOSIS — E875 Hyperkalemia: Secondary | ICD-10-CM | POA: Diagnosis not present

## 2022-04-19 DIAGNOSIS — R5383 Other fatigue: Secondary | ICD-10-CM | POA: Diagnosis not present

## 2022-04-26 DIAGNOSIS — I471 Supraventricular tachycardia, unspecified: Secondary | ICD-10-CM | POA: Diagnosis not present

## 2022-04-26 DIAGNOSIS — R030 Elevated blood-pressure reading, without diagnosis of hypertension: Secondary | ICD-10-CM | POA: Diagnosis not present

## 2022-04-26 DIAGNOSIS — E870 Hyperosmolality and hypernatremia: Secondary | ICD-10-CM | POA: Diagnosis not present

## 2022-04-26 DIAGNOSIS — E875 Hyperkalemia: Secondary | ICD-10-CM | POA: Diagnosis not present

## 2022-04-26 DIAGNOSIS — E7849 Other hyperlipidemia: Secondary | ICD-10-CM | POA: Diagnosis not present

## 2022-04-26 DIAGNOSIS — D500 Iron deficiency anemia secondary to blood loss (chronic): Secondary | ICD-10-CM | POA: Diagnosis not present

## 2022-04-26 DIAGNOSIS — E1165 Type 2 diabetes mellitus with hyperglycemia: Secondary | ICD-10-CM | POA: Diagnosis not present

## 2022-05-27 ENCOUNTER — Encounter: Payer: Self-pay | Admitting: Gastroenterology

## 2022-05-27 ENCOUNTER — Ambulatory Visit (INDEPENDENT_AMBULATORY_CARE_PROVIDER_SITE_OTHER): Payer: Medicare Other | Admitting: Gastroenterology

## 2022-05-27 VITALS — BP 149/77 | HR 66 | Temp 97.9°F | Ht 61.0 in | Wt 158.6 lb

## 2022-05-27 DIAGNOSIS — K219 Gastro-esophageal reflux disease without esophagitis: Secondary | ICD-10-CM | POA: Diagnosis not present

## 2022-05-27 NOTE — Patient Instructions (Signed)
Continue omeprazole 20 milligrams once a day. Make sure to take 30 minutes before breakfast daily.   If you feel you need a bit better control, you can take the pantoprazole 40 milligrams once a day. Please call us if you change this, so we can make sure to document in the chart.  We will see you in 3-6 months!  I enjoyed seeing you again today! At our first visit, I mentioned how I value our relationship and want to provide genuine, compassionate, and quality care. You may receive a survey regarding your visit with me, and I welcome your feedback! Thanks so much for taking the time to complete this. I look forward to seeing you again.   Annitta Needs, PhD, ANP-BC [facility] Gastroenterology

## 2022-05-27 NOTE — Progress Notes (Signed)
Gastroenterology Office Note     Primary Care Physician:  Curlene Labrum, MD  Primary Gastroenterologist: Dr. Abbey Chatters    Chief Complaint   Chief Complaint  Patient presents with   Follow-up    Was having issues with abdominal pain, lowered her relux meds and is feeling better.     History of Present Illness   Kristina Berry is a 77 y.o. female presenting today in follow-up with a history of GERD, adenomas, IDA in 2018 and followed by Hematology, returning in follow-up after EGD.   EGD with gastritis, negative H.pylori. Cut back on PPI as was having abdominal pain. Had been taking 40 mg BID pantoprazole and then changed to omeprazole 20 mg daily, which she had taken in the past. BM daily, soft. No further burping. No further abdominal pain. GERD feels controlled. Feels a little bit "off" today.      EGD Nov 2023 - Z-line regular, 38 cm from the incisors.                           - Gastritis. Biopsied.                           - Normal duodenal bulb, first portion of the                            duodenum and second portion of the duodenum. Negative H.pylori   Colonoscopy Aug 2023 Non-bleeding internal hemorrhoids.                           - Diverticulosis in the sigmoid colon.                           - One 5 mm polyp in the descending colon, removed                            with a cold snare. Resected and retrieved.                           - The examination was otherwise normal. Tubular adenoma.     Past Medical History:  Diagnosis Date   Anemia    Anxiety    Baker's cyst of knee    Chronic low back pain    Diastolic dysfunction    Essential hypertension    Family history of breast cancer    Family history of melanoma    Family history of stomach cancer    Family history of uterine cancer    GERD (gastroesophageal reflux disease)    Hyperlipidemia    Iron deficiency anemia 05/18/2016   LBBB (left bundle branch block)     Melanoma (HCC)    Mitral regurgitation    Mild to moderate 2011   Skin cancer    basal cell removed from face/melanoma removed from rt arm   Type 2 diabetes mellitus [facility])     Past Surgical History:  Procedure Laterality Date   ADENOIDECTOMY     BACK SURGERY  05/2014   BIOPSY  05/28/2016   Procedure: BIOPSY;  Surgeon: Danie Binder, MD;  [facility];  Service: Endoscopy;;  duodenal and gastric   BIOPSY  02/18/2022   Procedure: BIOPSY;  Surgeon: Eloise Harman, DO;  [facility];  Service: Endoscopy;;   CHOLECYSTECTOMY  2009   COLONOSCOPY N/A 05/28/2016   Dr. Oneida Alar: one 8 mm tubular adenomas, diverticulosis in recto-sigmoid, sigmoid, and descending. Colonoscopy in 5-10 years if benefits outweigh the risks   COLONOSCOPY WITH PROPOFOL N/A 11/30/2021   Surgeon: Hurshel Keys K, DO; nonbleeding internal hemorrhoids, diverticulosis in sigmoid colon, 5 mm tubular adenoma.  No recommendations to repeat colonoscopy due to age.   ESOPHAGOGASTRODUODENOSCOPY N/A 05/28/2016   Dr. Oneida Alar: multiple benign-appearing gastric polyps, gastritis   ESOPHAGOGASTRODUODENOSCOPY (EGD) WITH PROPOFOL N/A 02/18/2022   Procedure: ESOPHAGOGASTRODUODENOSCOPY (EGD) WITH PROPOFOL;  Surgeon: Eloise Harman, DO;  [facility];  Service: Endoscopy;  Laterality: N/A;  12:15pm, asa 2, pt knows to arrive at 8:45   [place] 06/23/2016   Dr. Oneida Alar: mild ileitis   MELANOMA SURGERY Right    POLYPECTOMY  05/28/2016   Procedure: POLYPECTOMY;  Surgeon: Danie Binder, MD;  [facility];  Service: Endoscopy;;  ascending colon    POLYPECTOMY  11/30/2021   Procedure: POLYPECTOMY INTESTINAL;  Surgeon: Eloise Harman, DO;  [facility];  Service: Endoscopy;;   SKIN CANCER EXCISION     Basal cell removal from left side of face   TONSILLECTOMY      Current Outpatient Medications  Medication Sig Dispense Refill   Ascorbic Acid (VITAMIN C PO) Take  2,000 mg by mouth daily.     aspirin 81 MG tablet Take 81 mg by mouth at bedtime.     atorvastatin (LIPITOR) 40 MG tablet Take 40 mg by mouth daily.     Cholecalciferol (VITAMIN D3) 2000 units TABS Take 2,000 Units by mouth daily.     dapagliflozin propanediol (FARXIGA) 10 MG TABS tablet Take 10 mg by mouth daily.     diltiazem (CARDIZEM CD) 240 MG 24 hr capsule TAKE 1 CAPSULE BY MOUTH DAILY 90 capsule 3   fluticasone (FLONASE) 50 MCG/ACT nasal spray Place 2 sprays into both nostrils as needed for allergies or rhinitis.     irbesartan (AVAPRO) 150 MG tablet Take 150 mg by mouth daily.     LANTUS SOLOSTAR 100 UNIT/ML Solostar Pen Inject 15-20 Units into the skin daily as needed (if above 150). Sliding scale     loratadine (CLARITIN) 10 MG tablet Take 10 mg by mouth at bedtime.     magnesium oxide (MAG-OX) 400 (240 Mg) MG tablet Take 400 mg by mouth daily.     metFORMIN (GLUCOPHAGE) 1000 MG tablet Take 1,000 mg by mouth 2 (two) times daily.     Multiple Vitamins-Minerals (PRESERVISION AREDS 2) CAPS Take 1 capsule by mouth 2 (two) times daily.     PARoxetine (PAXIL) 10 MG tablet Take 0.5 tablets by mouth daily.     RABEprazole (ACIPHEX) 20 MG tablet Take 20 mg by mouth daily.     zinc gluconate 50 MG tablet Take 50 mg by mouth daily.     No current facility-administered medications for this visit.    Allergies as of 05/27/2022 - Review Complete 05/27/2022  Allergen Reaction Noted   Cefdinir Other (See Comments) 10/24/2015   Nsaids Other (See Comments) 10/24/2015   Sulfa antibiotics Nausea Only 10/24/2015    Family History  Problem Relation Age of Onset   Osteoporosis Mother    Dementia Mother    Diabetes  Mother    Melanoma Mother        dx in her 12s   Breast cancer Sister 19       triple negative   Heart Problems Father    Diabetes Father    Breast cancer Sister 63   Learning disabilities Sister    COPD Sister    Lung cancer Sister    Breast cancer Maternal Aunt 48   Stomach  cancer Maternal Uncle    Uterine cancer Other 67   Colon cancer Neg Hx     Social History   Socioeconomic History   Marital status: Married    Spouse name: Not on file   Number of children: Not on file   Years of education: Not on file   Highest education level: Not on file  Occupational History   Not on file  Tobacco Use   Smoking status: Never    Passive exposure: Never   Smokeless tobacco: Never  Vaping Use   Vaping Use: Never used  Substance and Sexual Activity   Alcohol use: No    Alcohol/week: 0.0 standard drinks of alcohol   Drug use: No   Sexual activity: Not Currently    Comment: married  Other Topics Concern   Not on file  Social History Narrative   Not on file   Social Determinants of Health   Financial Resource Strain: Not on file  Food Insecurity: Not on file  Transportation Berry: Not on file  Physical Activity: Not on file  Stress: Not on file  Social Connections: Not on file  Intimate Partner Violence: Not on file     Review of Systems   Gen: Denies any fever, chills, fatigue, weight loss, lack of appetite.  CV: Denies chest pain, heart palpitations, peripheral edema, syncope.  Resp: Denies shortness of breath at rest or with exertion. Denies wheezing or cough.  GI: Denies dysphagia or odynophagia. Denies jaundice, hematemesis, fecal incontinence. GU : Denies urinary burning, urinary frequency, urinary hesitancy MS: Denies joint pain, muscle weakness, cramps, or limitation of movement.  Derm: Denies rash, itching, dry skin Psych: Denies depression, anxiety, memory loss, and confusion Heme: Denies bruising, bleeding, and enlarged lymph nodes.   Physical Exam   BP (!) 158/76 (BP Location: Right Arm, Patient Position: Sitting, Cuff Size: Normal)   Pulse 62   Temp 97.9 F (36.6 C) (Oral)   Ht 5' 1"$  (1.549 m)   Wt 158 lb 9.6 oz (71.9 kg)   SpO2 96%   BMI 29.97 kg/m  General:   Alert and oriented. Pleasant and cooperative. Well-nourished  and well-developed.  Head:  Normocephalic and atraumatic. Eyes:  Without icterus Abdomen:  +BS, soft, non-tender and non-distended. No HSM noted. No guarding or rebound. No masses appreciated.  Rectal:  Deferred  Msk:  Symmetrical without gross deformities. Normal posture. Extremities:  Without edema. Neurologic:  Alert and  oriented x4;  grossly normal neurologically. Skin:  Intact without significant lesions or rashes. Psych:  Alert and cooperative. Normal mood and affect.   Assessment   DENSIE ZINGSHEIM is a 77 y.o. female presenting today in follow-up with a history of GERD, adenomas, IDA in 2018 and followed by Hematology, returning in follow-up after EGD.    GERD: had been taking pantoprazole 40 mg BID but noted abdominal pain with this. After cutting back to omeprazole 20 mg daily, abdominal pain resolved. GERD overall well-controlled. Will keep at the lowest effective dosing. Doing well otherwise.    The patient  was found to have elevated blood pressure when vital signs were checked in the office. The blood pressure was rechecked by the nursing staff, and it was found to be persistently elevated >140/90 mmHg. I personally advised the patient to follow up closely with the PCP for hypertension control.   PLAN    Continue omeprazole 20 milligrams once a day, may switch to pantoprazole 40 mg daily if Berry to and will call to let us know so we may update chart Return in 3-6 months     Kristina Needs, PhD, [facility] Gastroenterology

## 2022-07-12 DIAGNOSIS — E1165 Type 2 diabetes mellitus with hyperglycemia: Secondary | ICD-10-CM | POA: Diagnosis not present

## 2022-07-12 DIAGNOSIS — K219 Gastro-esophageal reflux disease without esophagitis: Secondary | ICD-10-CM | POA: Diagnosis not present

## 2022-07-12 DIAGNOSIS — R5383 Other fatigue: Secondary | ICD-10-CM | POA: Diagnosis not present

## 2022-07-12 DIAGNOSIS — E875 Hyperkalemia: Secondary | ICD-10-CM | POA: Diagnosis not present

## 2022-07-12 DIAGNOSIS — E559 Vitamin D deficiency, unspecified: Secondary | ICD-10-CM | POA: Diagnosis not present

## 2022-07-12 DIAGNOSIS — E7849 Other hyperlipidemia: Secondary | ICD-10-CM | POA: Diagnosis not present

## 2022-07-19 DIAGNOSIS — R5383 Other fatigue: Secondary | ICD-10-CM | POA: Diagnosis not present

## 2022-07-19 DIAGNOSIS — Z1389 Encounter for screening for other disorder: Secondary | ICD-10-CM | POA: Diagnosis not present

## 2022-07-19 DIAGNOSIS — R002 Palpitations: Secondary | ICD-10-CM | POA: Diagnosis not present

## 2022-07-19 DIAGNOSIS — E7849 Other hyperlipidemia: Secondary | ICD-10-CM | POA: Diagnosis not present

## 2022-07-19 DIAGNOSIS — I471 Supraventricular tachycardia, unspecified: Secondary | ICD-10-CM | POA: Diagnosis not present

## 2022-07-19 DIAGNOSIS — E1165 Type 2 diabetes mellitus with hyperglycemia: Secondary | ICD-10-CM | POA: Diagnosis not present

## 2022-07-19 DIAGNOSIS — D500 Iron deficiency anemia secondary to blood loss (chronic): Secondary | ICD-10-CM | POA: Diagnosis not present

## 2022-07-19 DIAGNOSIS — E870 Hyperosmolality and hypernatremia: Secondary | ICD-10-CM | POA: Diagnosis not present

## 2022-07-19 DIAGNOSIS — E875 Hyperkalemia: Secondary | ICD-10-CM | POA: Diagnosis not present

## 2022-07-19 DIAGNOSIS — R030 Elevated blood-pressure reading, without diagnosis of hypertension: Secondary | ICD-10-CM | POA: Diagnosis not present

## 2022-07-24 DIAGNOSIS — G4733 Obstructive sleep apnea (adult) (pediatric): Secondary | ICD-10-CM | POA: Diagnosis not present

## 2022-08-26 ENCOUNTER — Ambulatory Visit (INDEPENDENT_AMBULATORY_CARE_PROVIDER_SITE_OTHER): Payer: Medicare Other | Admitting: Gastroenterology

## 2022-08-26 ENCOUNTER — Encounter: Payer: Self-pay | Admitting: Gastroenterology

## 2022-08-26 VITALS — BP 133/80 | HR 71 | Temp 97.5°F | Ht 61.0 in | Wt 159.1 lb

## 2022-08-26 DIAGNOSIS — K219 Gastro-esophageal reflux disease without esophagitis: Secondary | ICD-10-CM

## 2022-08-26 NOTE — Patient Instructions (Signed)
Continue taking omeprazole (Prilosec) daily, 30 minutes before breakfast daily as it's best absorbed this way.    We will see you back in 1 year!!  I enjoyed seeing you again today! I value our relationship and want to provide genuine, compassionate, and quality care. You may receive a survey regarding your visit with me, and I welcome your feedback! Thanks so much for taking the time to complete this. I look forward to seeing you again.      Gelene Mink, PhD, ANP-BC [facility] Gastroenterology

## 2022-08-26 NOTE — Progress Notes (Signed)
Gastroenterology Office Note     Primary Care Physician:  Juliette Alcide, MD  Primary Gastroenterologist: Dr. Marletta Lor    Chief Complaint   Chief Complaint  Patient presents with   Follow-up    Patient here today for a follow up. Patient says her Genella Rife is much better. Patient is taking omeprazole 20 mg once per day, which does control her symptoms along with a pre and pro biotic. Patient also here today for a follow up on her IDA. She says she is not seeing any dark or bloody stools she follows hematologist and receives regular iron infusions.      History of Present Illness   Kristina Berry is a 77 y.o. female presenting today in follow-up with a history of GERD, adenomas, IDA in 2018 and followed by Hematology. Presents today for routine follow-up.  Taking Align daily. BM daily. GERD controlled on omeprazole 20 mg daily. Early satiety, N/V resolved. This had been attributed likely to Ozempic. She feels much improved now. Avoids eating late. No dysphagia. No abdominal pain, N/V, changes in bowel habits, constipation, diarrhea, overt GI bleeding, unexplained weight loss, lack of appetite, unexplained weight gain.     EGD Nov 2023 - Z-line regular, 38 cm from the incisors.                           - Gastritis. Biopsied.                           - Normal duodenal bulb, first portion of the                            duodenum and second portion of the duodenum. Negative H.pylori     Colonoscopy Aug 2023 Non-bleeding internal hemorrhoids.                           - Diverticulosis in the sigmoid colon.                           - One 5 mm polyp in the descending colon, removed                            with a cold snare. Resected and retrieved.                           - The examination was otherwise normal. Tubular adenoma.    Past Medical History:  Diagnosis Date   Anemia    Anxiety    Baker's cyst of knee    Chronic low back pain    Diastolic dysfunction     Essential hypertension    Family history of breast cancer    Family history of melanoma    Family history of stomach cancer    Family history of uterine cancer    GERD (gastroesophageal reflux disease)    Hyperlipidemia    Iron deficiency anemia 05/18/2016   LBBB (left bundle branch block)    Melanoma (HCC)    Mitral regurgitation    Mild to moderate 2011   Skin cancer    basal cell removed from face/melanoma removed from rt arm   Type 2 diabetes mellitus (HCC)  Past Surgical History:  Procedure Laterality Date   ADENOIDECTOMY     BACK SURGERY  05/2014   BIOPSY  05/28/2016   Procedure: BIOPSY;  Surgeon: West Bali, MD;  [facility];  Service: Endoscopy;;  duodenal and gastric   BIOPSY  02/18/2022   Procedure: BIOPSY;  Surgeon: Lanelle Bal, DO;  [facility];  Service: Endoscopy;;   CHOLECYSTECTOMY  2009   COLONOSCOPY N/A 05/28/2016   Dr. Darrick Penna: one 8 mm tubular adenomas, diverticulosis in recto-sigmoid, sigmoid, and descending. Colonoscopy in 5-10 years if benefits outweigh the risks   COLONOSCOPY WITH PROPOFOL N/A 11/30/2021   Surgeon: Earnest Bailey K, DO; nonbleeding internal hemorrhoids, diverticulosis in sigmoid colon, 5 mm tubular adenoma.  No recommendations to repeat colonoscopy due to age.   ESOPHAGOGASTRODUODENOSCOPY N/A 05/28/2016   Dr. Darrick Penna: multiple benign-appearing gastric polyps, gastritis   ESOPHAGOGASTRODUODENOSCOPY (EGD) WITH PROPOFOL N/A 02/18/2022   gastritis s/p biopsy. Negative H.pylori. normal duodenum   GIVENS CAPSULE STUDY N/A 06/23/2016   Dr. Darrick Penna: mild ileitis   MELANOMA SURGERY Right    POLYPECTOMY  05/28/2016   Procedure: POLYPECTOMY;  Surgeon: West Bali, MD;  [facility];  Service: Endoscopy;;  ascending colon    POLYPECTOMY  11/30/2021   Procedure: POLYPECTOMY INTESTINAL;  Surgeon: Lanelle Bal, DO;  [facility];  Service: Endoscopy;;   SKIN CANCER EXCISION     Basal cell  removal from left side of face   TONSILLECTOMY      Current Outpatient Medications  Medication Sig Dispense Refill   Ascorbic Acid (VITAMIN C PO) Take 2,000 mg by mouth daily.     aspirin 81 MG tablet Take 81 mg by mouth at bedtime.     atorvastatin (LIPITOR) 40 MG tablet Take 40 mg by mouth daily.     Cholecalciferol (VITAMIN D3) 2000 units TABS Take 2,000 Units by mouth daily.     dapagliflozin propanediol (FARXIGA) 10 MG TABS tablet Take 10 mg by mouth daily.     diltiazem (CARDIZEM CD) 240 MG 24 hr capsule TAKE 1 CAPSULE BY MOUTH DAILY 90 capsule 3   fluticasone (FLONASE) 50 MCG/ACT nasal spray Place 2 sprays into both nostrils as needed for allergies or rhinitis.     irbesartan (AVAPRO) 150 MG tablet Take 150 mg by mouth daily.     LANTUS SOLOSTAR 100 UNIT/ML Solostar Pen Inject 15-20 Units into the skin daily as needed (if above 150). Sliding scale     loratadine (CLARITIN) 10 MG tablet Take 10 mg by mouth at bedtime.     magnesium oxide (MAG-OX) 400 (240 Mg) MG tablet Take 400 mg by mouth daily.     metFORMIN (GLUCOPHAGE) 1000 MG tablet Take 1,000 mg by mouth 2 (two) times daily.     Multiple Vitamins-Minerals (PRESERVISION AREDS 2) CAPS Take 1 capsule by mouth 2 (two) times daily.     omeprazole (PRILOSEC OTC) 20 MG tablet Take 20 mg by mouth daily.     PARoxetine (PAXIL) 10 MG tablet Take 0.5 tablets by mouth daily.     zinc gluconate 50 MG tablet Take 50 mg by mouth daily.     No current facility-administered medications for this visit.    Allergies as of 08/26/2022 - Review Complete 08/26/2022  Allergen Reaction Noted   Cefdinir Other (See Comments) 10/24/2015   Nsaids Other (See Comments) 10/24/2015   Sulfa antibiotics Nausea Only 10/24/2015    Family History  Problem Relation Age  of Onset   Osteoporosis Mother    Dementia Mother    Diabetes Mother    Melanoma Mother        dx in her 23s   Breast cancer Sister 31       triple negative   Heart Problems Father     Diabetes Father    Breast cancer Sister 66   Learning disabilities Sister    COPD Sister    Lung cancer Sister    Breast cancer Maternal Aunt 72   Stomach cancer Maternal Uncle    Uterine cancer Other 35   Colon cancer Neg Hx     Social History   Socioeconomic History   Marital status: Married    Spouse name: Not on file   Number of children: Not on file   Years of education: Not on file   Highest education level: Not on file  Occupational History   Not on file  Tobacco Use   Smoking status: Never    Passive exposure: Never   Smokeless tobacco: Never  Vaping Use   Vaping Use: Never used  Substance and Sexual Activity   Alcohol use: No    Alcohol/week: 0.0 standard drinks of alcohol   Drug use: No   Sexual activity: Not Currently    Comment: married  Other Topics Concern   Not on file  Social History Narrative   Not on file   Social Determinants of Health   Financial Resource Strain: Not on file  Food Insecurity: Not on file  Transportation Needs: Not on file  Physical Activity: Not on file  Stress: Not on file  Social Connections: Not on file  Intimate Partner Violence: Not on file     Review of Systems   Gen: Denies any fever, chills, fatigue, weight loss, lack of appetite.  CV: Denies chest pain, heart palpitations, peripheral edema, syncope.  Resp: Denies shortness of breath at rest or with exertion. Denies wheezing or cough.  GI: Denies dysphagia or odynophagia. Denies jaundice, hematemesis, fecal incontinence. GU : Denies urinary burning, urinary frequency, urinary hesitancy MS: Denies joint pain, muscle weakness, cramps, or limitation of movement.  Derm: Denies rash, itching, dry skin Psych: Denies depression, anxiety, memory loss, and confusion Heme: Denies bruising, bleeding, and enlarged lymph nodes.   Physical Exam   BP 133/80 (BP Location: Left Arm, Patient Position: Sitting, Cuff Size: Large)   Pulse 71   Temp (!) 97.5 F (36.4 C)  (Temporal)   Ht 5\' 1"  (1.549 m)   Wt 159 lb 1.6 oz (72.2 kg)   BMI 30.06 kg/m  General:   Alert and oriented. Pleasant and cooperative. Well-nourished and well-developed.  Head:  Normocephalic and atraumatic. Eyes:  Without icterus Abdomen:  +BS, soft, non-tender and non-distended. No HSM noted. No guarding or rebound. No masses appreciated.  Rectal:  Deferred  Msk:  Symmetrical without gross deformities. Normal posture. Extremities:  Without edema. Neurologic:  Alert and  oriented x4;  grossly normal neurologically. Skin:  Intact without significant lesions or rashes. Psych:  Alert and cooperative. Normal mood and affect.   Assessment   Kristina Berry is a 77 y.o. female presenting today in follow-up with a history of GERD, adenomas, IDA followed by Hematology.  Doing quite well now with omeprazole 20 mg daily. No alarm signs/symptoms. EGD on file from Nov 2023 with gastritis. She has attempted to come off PPI but had recurrence of symptomatic GERD. Will keep on lowest dose that treats symptoms.  Continues yearly visit with Hematology. Labs upcoming. No overt GI bleeding.   PLAN    Continue omeprazole 20 mg daily Keep follow-up with Hematology As doing well, return in 1 year   Gelene Mink, PhD, [facility] Gastroenterology

## 2022-09-10 ENCOUNTER — Inpatient Hospital Stay: Payer: Medicare Other | Attending: Physician Assistant

## 2022-09-10 ENCOUNTER — Inpatient Hospital Stay: Payer: Medicare Other | Admitting: Physician Assistant

## 2022-09-10 VITALS — BP 150/59 | HR 65 | Temp 98.5°F | Resp 16 | Wt 160.9 lb

## 2022-09-10 DIAGNOSIS — Z800 Family history of malignant neoplasm of digestive organs: Secondary | ICD-10-CM | POA: Insufficient documentation

## 2022-09-10 DIAGNOSIS — Z85820 Personal history of malignant melanoma of skin: Secondary | ICD-10-CM | POA: Insufficient documentation

## 2022-09-10 DIAGNOSIS — Z79899 Other long term (current) drug therapy: Secondary | ICD-10-CM | POA: Diagnosis not present

## 2022-09-10 DIAGNOSIS — Z7984 Long term (current) use of oral hypoglycemic drugs: Secondary | ICD-10-CM | POA: Insufficient documentation

## 2022-09-10 DIAGNOSIS — D509 Iron deficiency anemia, unspecified: Secondary | ICD-10-CM | POA: Diagnosis not present

## 2022-09-10 DIAGNOSIS — D500 Iron deficiency anemia secondary to blood loss (chronic): Secondary | ICD-10-CM

## 2022-09-10 DIAGNOSIS — Z8049 Family history of malignant neoplasm of other genital organs: Secondary | ICD-10-CM | POA: Diagnosis not present

## 2022-09-10 DIAGNOSIS — Z7982 Long term (current) use of aspirin: Secondary | ICD-10-CM | POA: Insufficient documentation

## 2022-09-10 DIAGNOSIS — Z801 Family history of malignant neoplasm of trachea, bronchus and lung: Secondary | ICD-10-CM | POA: Diagnosis not present

## 2022-09-10 DIAGNOSIS — Z794 Long term (current) use of insulin: Secondary | ICD-10-CM | POA: Insufficient documentation

## 2022-09-10 DIAGNOSIS — Z803 Family history of malignant neoplasm of breast: Secondary | ICD-10-CM | POA: Insufficient documentation

## 2022-09-10 LAB — CBC WITH DIFFERENTIAL/PLATELET
Abs Immature Granulocytes: 0.01 10*3/uL (ref 0.00–0.07)
Basophils Absolute: 0.1 10*3/uL (ref 0.0–0.1)
Basophils Relative: 1 %
Eosinophils Absolute: 0.3 10*3/uL (ref 0.0–0.5)
Eosinophils Relative: 4 %
HCT: 38.8 % (ref 36.0–46.0)
Hemoglobin: 12.5 g/dL (ref 12.0–15.0)
Immature Granulocytes: 0 %
Lymphocytes Relative: 38 %
Lymphs Abs: 2.4 10*3/uL (ref 0.7–4.0)
MCH: 29.6 pg (ref 26.0–34.0)
MCHC: 32.2 g/dL (ref 30.0–36.0)
MCV: 91.9 fL (ref 80.0–100.0)
Monocytes Absolute: 0.5 10*3/uL (ref 0.1–1.0)
Monocytes Relative: 7 %
Neutro Abs: 3.1 10*3/uL (ref 1.7–7.7)
Neutrophils Relative %: 50 %
Platelets: 244 10*3/uL (ref 150–400)
RBC: 4.22 MIL/uL (ref 3.87–5.11)
RDW: 12.6 % (ref 11.5–15.5)
WBC: 6.3 10*3/uL (ref 4.0–10.5)
nRBC: 0 % (ref 0.0–0.2)

## 2022-09-10 LAB — IRON AND TIBC
Iron: 58 ug/dL (ref 28–170)
Saturation Ratios: 22 % (ref 10.4–31.8)
TIBC: 263 ug/dL (ref 250–450)
UIBC: 205 ug/dL

## 2022-09-10 LAB — FERRITIN: Ferritin: 130 ng/mL (ref 11–307)

## 2022-09-10 NOTE — Progress Notes (Signed)
[facility] [address], [place] [address]   CLINIC:  Medical Oncology/Hematology  PCP:  Juliette Alcide, MD [address] [place] [address] [phone]   REASON FOR VISIT: Follow-up for iron deficiency anemia   CURRENT THERAPY: Intermittent IV iron infusions  INTERVAL HISTORY:   Kristina Berry 77 y.o. female returns for routine follow-up of her iron deficiency anemia (history of GI blood loss).  She was last seen by Rojelio Brenner PA-C on 09/11/2021.   At today's visit, she reports feeling well.  No recent hospitalizations, surgeries, or changes in baseline health status.   She denies any major bleeding events such as hematemesis, hematochezia, melena, or epistaxis.  She has some occasional dark bowel movements twice this past year.  She has some mild fatigue, which she thinks may be related to her sleep apnea.  She denies any pica, restless leg symptoms, chest pain, dyspnea on exertion, dizziness, or syncope.  She has 75% energy and 100% appetite. She endorses that she is maintaining a stable weight.   ASSESSMENT & PLAN:  1.  Iron deficiency anemia - History of GI blood loss secondary to erosions in the ileum - Capsule video endoscopy (06/23/2016): Occasional erosions in the ileum - EGD (02/18/2022): Z-line irregular, gastritis (negative for H. pylori), normal duodenum - Colonoscopy (11/30/2021): Nonbleeding internal hemorrhoids, diverticulosis, polyp x 1 (tubular adenoma) - Patient also has difficulty absorbing iron.  Previously she was on oral iron therapy with no improvement. - Her last IV Feraheme infusion was on 12/27/2019 and 01/03/2020, reported energy improvement after infusions - No bright red blood per rectum or melena - Most recent labs (09/10/22): Hgb 12.5/MCV 91.9.  Ferritin 130, iron saturation 22%. - PLAN: No indication for IV iron at this time. - Her labs have been stable without need for IV iron supplementation for the past 3+ years.   Therefore, we will tentatively discharge patient to PCP for ongoing monitoring.  Recommend that she have her CBC and iron panel checked at least once a year, or sooner if needed based on symptoms.  If she is found to have any recurrent iron deficiency in the future, we would be happy to see her back in the clinic on an as-needed basis.   2.  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  Can follow-up as needed in the future at discretion of PCP.     REVIEW OF SYSTEMS:   Review of Systems  Constitutional:  Positive for fatigue (Mild, chronic, at baseline). Negative for appetite change, chills, diaphoresis, fever and unexpected weight change.  HENT:   Negative for lump/mass and nosebleeds.   Eyes:  Negative for eye problems.  Respiratory:  Negative for cough, hemoptysis and shortness of breath.   Cardiovascular:  Negative for chest pain, leg swelling and palpitations.  Gastrointestinal:  Negative for  abdominal pain, blood in stool, constipation, diarrhea, nausea and vomiting.  Genitourinary:  Negative for hematuria.   Skin: Negative.   Neurological:  Negative for dizziness, headaches and light-headedness.  Hematological:  Does not bruise/bleed easily.     PHYSICAL EXAM:   ECOG PERFORMANCE STATUS: 1 - Symptomatic but completely ambulatory  There were no vitals filed for this visit. There were no vitals filed for this visit. Physical Exam Constitutional:      Appearance: Normal appearance. She is obese.  Cardiovascular:     Heart sounds: Normal heart sounds.  Pulmonary:     Breath sounds: Normal breath sounds.  Neurological:     General: No focal deficit present.     Mental Status: Mental status is at baseline.  Psychiatric:        Behavior: Behavior normal. Behavior is cooperative.     PAST MEDICAL/SURGICAL HISTORY:  Past Medical History:  Diagnosis Date   Anemia    Anxiety    Baker's cyst of knee    Chronic low back pain    Diastolic dysfunction    Essential hypertension    Family history of breast cancer    Family history of melanoma    Family history of stomach cancer    Family history of uterine cancer    GERD (gastroesophageal reflux disease)    Hyperlipidemia    Iron deficiency anemia 05/18/2016   LBBB (left bundle branch block)    Melanoma (HCC)    Mitral regurgitation    Mild to moderate 2011   Skin cancer    basal cell removed from face/melanoma removed from rt arm   Type 2 diabetes mellitus [facility])    Past Surgical History:  Procedure Laterality Date   ADENOIDECTOMY     BACK SURGERY  05/2014   BIOPSY  05/28/2016   Procedure: BIOPSY;  Surgeon: West Bali, MD;  [facility];  Service: Endoscopy;;  duodenal and gastric   BIOPSY  02/18/2022   Procedure: BIOPSY;  Surgeon: Lanelle Bal, DO;  [facility];  Service: Endoscopy;;   CHOLECYSTECTOMY  2009   COLONOSCOPY N/A 05/28/2016   Dr. Darrick Penna: one 8 mm tubular adenomas, diverticulosis in recto-sigmoid, sigmoid, and descending. Colonoscopy in 5-10 years if benefits outweigh the risks   COLONOSCOPY WITH PROPOFOL N/A 11/30/2021   Surgeon: Earnest Bailey K, DO; nonbleeding internal hemorrhoids, diverticulosis in sigmoid colon, 5 mm tubular adenoma.  No  recommendations to repeat colonoscopy due to age.   ESOPHAGOGASTRODUODENOSCOPY N/A 05/28/2016   Dr. Darrick Penna: multiple benign-appearing gastric polyps, gastritis   ESOPHAGOGASTRODUODENOSCOPY (EGD) WITH PROPOFOL N/A 02/18/2022   gastritis s/p biopsy. Negative H.pylori. normal duodenum   GIVENS CAPSULE STUDY N/A 06/23/2016   Dr. Darrick Penna: mild ileitis   MELANOMA SURGERY Right    POLYPECTOMY  05/28/2016   Procedure: POLYPECTOMY;  Surgeon: West Bali, MD;  [facility];  Service: Endoscopy;;  ascending colon    POLYPECTOMY  11/30/2021   Procedure: POLYPECTOMY INTESTINAL;  Surgeon: Lanelle Bal, DO;  [facility];  Service: Endoscopy;;   SKIN CANCER EXCISION     Basal cell removal from left side of face   TONSILLECTOMY      SOCIAL HISTORY:  Social History   Socioeconomic History   Marital status: Married    Spouse name: Not on file   Number of children: Not on file   Years of education: Not on file   Highest education level: Not on file  Occupational History  Not on file  Tobacco Use   Smoking status: Never    Passive exposure: Never   Smokeless tobacco: Never  Vaping Use   Vaping Use: Never used  Substance and Sexual Activity   Alcohol use: No    Alcohol/week: 0.0 standard drinks of alcohol   Drug use: No   Sexual activity: Not Currently    Comment: married  Other Topics Concern   Not on file  Social History Narrative   Not on file   Social Determinants of Health   Financial Resource Strain: Not on file  Food Insecurity: Not on file  Transportation Needs: Not on file  Physical Activity: Not on file  Stress: Not on file  Social Connections: Not on file  Intimate Partner Violence: Not on file    FAMILY HISTORY:  Family History  Problem Relation Age of Onset   Osteoporosis Mother    Dementia Mother    Diabetes Mother    Melanoma Mother        dx in her 27s   Breast cancer Sister 27       triple negative   Heart Problems Father     Diabetes Father    Breast cancer Sister 5   Learning disabilities Sister    COPD Sister    Lung cancer Sister    Breast cancer Maternal Aunt 48   Stomach cancer Maternal Uncle    Uterine cancer Other 35   Colon cancer Neg Hx     CURRENT MEDICATIONS:  Outpatient Encounter Medications as of 09/10/2022  Medication Sig   Ascorbic Acid (VITAMIN C PO) Take 2,000 mg by mouth daily.   aspirin 81 MG tablet Take 81 mg by mouth at bedtime.   atorvastatin (LIPITOR) 40 MG tablet Take 40 mg by mouth daily.   Cholecalciferol (VITAMIN D3) 2000 units TABS Take 2,000 Units by mouth daily.   dapagliflozin propanediol (FARXIGA) 10 MG TABS tablet Take 10 mg by mouth daily.   diltiazem (CARDIZEM CD) 240 MG 24 hr capsule TAKE 1 CAPSULE BY MOUTH DAILY   fluticasone (FLONASE) 50 MCG/ACT nasal spray Place 2 sprays into both nostrils as needed for allergies or rhinitis.   irbesartan (AVAPRO) 150 MG tablet Take 150 mg by mouth daily.   LANTUS SOLOSTAR 100 UNIT/ML Solostar Pen Inject 15-20 Units into the skin daily as needed (if above 150). Sliding scale   loratadine (CLARITIN) 10 MG tablet Take 10 mg by mouth at bedtime.   magnesium oxide (MAG-OX) 400 (240 Mg) MG tablet Take 400 mg by mouth daily.   metFORMIN (GLUCOPHAGE) 1000 MG tablet Take 1,000 mg by mouth 2 (two) times daily.   Multiple Vitamins-Minerals (PRESERVISION AREDS 2) CAPS Take 1 capsule by mouth 2 (two) times daily.   omeprazole (PRILOSEC OTC) 20 MG tablet Take 20 mg by mouth daily.   PARoxetine (PAXIL) 10 MG tablet Take 0.5 tablets by mouth daily.   zinc gluconate 50 MG tablet Take 50 mg by mouth daily.   No facility-administered encounter medications on file as of 09/10/2022.    ALLERGIES:  Allergies  Allergen Reactions   Cefdinir Other (See Comments)    Stomach cramps   Nsaids Other (See Comments)    Stomach pain   Sulfa Antibiotics Nausea Only    LABORATORY DATA:  I have reviewed the labs as listed.  CBC    Component Value  Date/Time   WBC 7.6 08/25/2021 1114   RBC 4.19 08/25/2021 1114   HGB 12.1 08/25/2021 1114  HCT 38.2 08/25/2021 1114   PLT 252 08/25/2021 1114   MCV 91.2 08/25/2021 1114   MCH 28.9 08/25/2021 1114   MCHC 31.7 08/25/2021 1114   RDW 12.8 08/25/2021 1114   LYMPHSABS 2.1 08/25/2021 1114   MONOABS 0.5 08/25/2021 1114   EOSABS 0.2 08/25/2021 1114   BASOSABS 0.1 08/25/2021 1114      Latest Ref Rng & Units 02/09/2022   11:32 AM 10/12/2021    2:24 PM 01/13/2021    9:49 AM  CMP  Glucose 70 - 99 mg/dL 119  147  829   BUN 8 - 27 mg/dL 10  16  17    Creatinine 0.57 - 1.00 mg/dL 5.62  1.30  8.65   Sodium 134 - 144 mmol/L 142  143  139   Potassium 3.5 - 5.2 mmol/L 5.3  4.9  4.7   Chloride 96 - 106 mmol/L 101  101  102   CO2 20 - 29 mmol/L 27  27  29    Calcium 8.7 - 10.3 mg/dL 78.4  9.9  9.1   Total Protein 6.5 - 8.1 g/dL   6.3   Total Bilirubin 0.3 - 1.2 mg/dL   0.6   Alkaline Phos 38 - 126 U/L   73   AST 15 - 41 U/L   17   ALT 0 - 44 U/L   15     DIAGNOSTIC IMAGING:  I have independently reviewed the relevant imaging and discussed with the patient.   WRAP UP:  All questions were answered. The patient knows to call the clinic with any problems, questions or concerns.  Medical decision making: Low  Time spent on visit: I spent 15 minutes counseling the patient face to face. The total time spent in the appointment was 22 minutes and more than 50% was on counseling.  Carnella Guadalajara, PA-C  09/10/2022 5:51 PM

## 2022-09-10 NOTE — Patient Instructions (Signed)
[facility] at [facility] Discharge Instructions  You were seen today by Rojelio Brenner PA-C for your iron deficiency anemia.  Your blood and iron levels look great, so you do not need any IV iron at this time! Since your blood and iron levels have been stable since 2021, we will tentatively discharge you from the hematology clinic.  However, you should continue to have your blood and iron levels checked at least once a year by your primary care doctor, or more often if needed based on your symptoms.  If you have any recurrent iron deficiency in the future, we would be happy to see you again if needed!  ** Thank you for trusting me with your healthcare!  I strive to provide all of my patients with quality care at each visit.  If you receive a survey for this visit, I would be so grateful to you for taking the time to provide feedback.  Thank you in advance!  ~ Marven Veley                   Dr. Doreatha Massed   &   Rojelio Brenner, PA-C   - - - - - - - - - - - - - - - - - -       Thank you for choosing [facility] at [facility] to provide your oncology and hematology care.  To afford each patient quality time with our provider, please arrive at least 15 minutes before your scheduled appointment time.   If you have a lab appointment with the Cancer Center please come in thru the Main Entrance and check in at the main information desk.  You need to re-schedule your appointment should you arrive 10 or more minutes late.  We strive to give you quality time with our providers, and arriving late affects you and other patients whose appointments are after yours.  Also, if you no show three or more times for appointments you may be dismissed from the clinic at the providers discretion.     Again, thank you for choosing [facility].  Our hope is that these requests will decrease the amount of time that you wait before being seen by our  physicians.       _____________________________________________________________  Should you have questions after your visit to [facility], please contact our office at [phone] and follow the prompts.  Our office hours are 8:00 a.m. and 4:30 p.m. Monday - Friday.  Please note that voicemails left after 4:00 p.m. may not be returned until the following business day.  We are closed weekends and major holidays.  You do have access to a nurse 24-7, just call the main number to the clinic [phone] and do not press any options, hold on the line and a nurse will answer the phone.    For prescription refill requests, have your pharmacy contact our office and allow 72 hours.    Due to Covid, you will need to wear a mask upon entering the hospital. If you do not have a mask, a mask will be given to you at the Main Entrance upon arrival. For doctor visits, patients may have 1 support person age 70 or older with them. For treatment visits, patients can not have anyone with them due to social distancing guidelines and our immunocompromised population.

## 2022-09-13 DIAGNOSIS — Z1231 Encounter for screening mammogram for malignant neoplasm of breast: Secondary | ICD-10-CM | POA: Diagnosis not present

## 2022-10-04 ENCOUNTER — Other Ambulatory Visit: Payer: Self-pay | Admitting: Cardiology

## 2022-10-05 ENCOUNTER — Other Ambulatory Visit: Payer: Self-pay | Admitting: Cardiology

## 2022-10-05 MED ORDER — DILTIAZEM HCL ER COATED BEADS 240 MG PO CP24: 240.0000 mg | ORAL_CAPSULE | Freq: Every day | ORAL | 2 refills | Status: DC

## 2022-10-28 DIAGNOSIS — E1165 Type 2 diabetes mellitus with hyperglycemia: Secondary | ICD-10-CM | POA: Diagnosis not present

## 2022-11-04 DIAGNOSIS — G4733 Obstructive sleep apnea (adult) (pediatric): Secondary | ICD-10-CM | POA: Diagnosis not present

## 2022-11-04 DIAGNOSIS — E7849 Other hyperlipidemia: Secondary | ICD-10-CM | POA: Diagnosis not present

## 2022-11-04 DIAGNOSIS — R030 Elevated blood-pressure reading, without diagnosis of hypertension: Secondary | ICD-10-CM | POA: Diagnosis not present

## 2022-11-04 DIAGNOSIS — E1165 Type 2 diabetes mellitus with hyperglycemia: Secondary | ICD-10-CM | POA: Diagnosis not present

## 2022-11-04 DIAGNOSIS — D500 Iron deficiency anemia secondary to blood loss (chronic): Secondary | ICD-10-CM | POA: Diagnosis not present

## 2022-11-10 DIAGNOSIS — E782 Mixed hyperlipidemia: Secondary | ICD-10-CM | POA: Diagnosis not present

## 2022-11-10 DIAGNOSIS — E1165 Type 2 diabetes mellitus with hyperglycemia: Secondary | ICD-10-CM | POA: Diagnosis not present

## 2022-11-10 DIAGNOSIS — K219 Gastro-esophageal reflux disease without esophagitis: Secondary | ICD-10-CM | POA: Diagnosis not present

## 2022-12-03 ENCOUNTER — Encounter (INDEPENDENT_AMBULATORY_CARE_PROVIDER_SITE_OTHER): Payer: Medicare Other | Admitting: Ophthalmology

## 2022-12-08 DIAGNOSIS — J069 Acute upper respiratory infection, unspecified: Secondary | ICD-10-CM | POA: Diagnosis not present

## 2022-12-11 DIAGNOSIS — U071 COVID-19: Secondary | ICD-10-CM | POA: Diagnosis not present

## 2022-12-20 ENCOUNTER — Encounter (INDEPENDENT_AMBULATORY_CARE_PROVIDER_SITE_OTHER): Payer: Medicare Other | Admitting: Ophthalmology

## 2022-12-20 DIAGNOSIS — H35033 Hypertensive retinopathy, bilateral: Secondary | ICD-10-CM

## 2022-12-20 DIAGNOSIS — D3132 Benign neoplasm of left choroid: Secondary | ICD-10-CM

## 2022-12-20 DIAGNOSIS — I10 Essential (primary) hypertension: Secondary | ICD-10-CM

## 2022-12-20 DIAGNOSIS — D3131 Benign neoplasm of right choroid: Secondary | ICD-10-CM

## 2022-12-20 DIAGNOSIS — H353132 Nonexudative age-related macular degeneration, bilateral, intermediate dry stage: Secondary | ICD-10-CM | POA: Diagnosis not present

## 2022-12-20 DIAGNOSIS — H43823 Vitreomacular adhesion, bilateral: Secondary | ICD-10-CM

## 2022-12-29 ENCOUNTER — Other Ambulatory Visit: Payer: Self-pay | Admitting: Cardiology

## 2023-01-10 DIAGNOSIS — Z23 Encounter for immunization: Secondary | ICD-10-CM | POA: Diagnosis not present

## 2023-01-14 DIAGNOSIS — G4733 Obstructive sleep apnea (adult) (pediatric): Secondary | ICD-10-CM | POA: Diagnosis not present

## 2023-02-02 DIAGNOSIS — D509 Iron deficiency anemia, unspecified: Secondary | ICD-10-CM | POA: Diagnosis not present

## 2023-02-02 DIAGNOSIS — E875 Hyperkalemia: Secondary | ICD-10-CM | POA: Diagnosis not present

## 2023-02-02 DIAGNOSIS — K219 Gastro-esophageal reflux disease without esophagitis: Secondary | ICD-10-CM | POA: Diagnosis not present

## 2023-02-02 DIAGNOSIS — E1165 Type 2 diabetes mellitus with hyperglycemia: Secondary | ICD-10-CM | POA: Diagnosis not present

## 2023-02-02 DIAGNOSIS — R5383 Other fatigue: Secondary | ICD-10-CM | POA: Diagnosis not present

## 2023-02-02 DIAGNOSIS — E782 Mixed hyperlipidemia: Secondary | ICD-10-CM | POA: Diagnosis not present

## 2023-02-02 DIAGNOSIS — Z1329 Encounter for screening for other suspected endocrine disorder: Secondary | ICD-10-CM | POA: Diagnosis not present

## 2023-02-02 DIAGNOSIS — D649 Anemia, unspecified: Secondary | ICD-10-CM | POA: Diagnosis not present

## 2023-02-03 DIAGNOSIS — E782 Mixed hyperlipidemia: Secondary | ICD-10-CM | POA: Diagnosis not present

## 2023-02-03 DIAGNOSIS — G4733 Obstructive sleep apnea (adult) (pediatric): Secondary | ICD-10-CM | POA: Diagnosis not present

## 2023-02-03 DIAGNOSIS — R5383 Other fatigue: Secondary | ICD-10-CM | POA: Diagnosis not present

## 2023-02-03 DIAGNOSIS — K219 Gastro-esophageal reflux disease without esophagitis: Secondary | ICD-10-CM | POA: Diagnosis not present

## 2023-02-07 DIAGNOSIS — G4733 Obstructive sleep apnea (adult) (pediatric): Secondary | ICD-10-CM | POA: Diagnosis not present

## 2023-02-07 DIAGNOSIS — E875 Hyperkalemia: Secondary | ICD-10-CM | POA: Diagnosis not present

## 2023-02-07 DIAGNOSIS — E870 Hyperosmolality and hypernatremia: Secondary | ICD-10-CM | POA: Diagnosis not present

## 2023-02-07 DIAGNOSIS — E1165 Type 2 diabetes mellitus with hyperglycemia: Secondary | ICD-10-CM | POA: Diagnosis not present

## 2023-02-07 DIAGNOSIS — R030 Elevated blood-pressure reading, without diagnosis of hypertension: Secondary | ICD-10-CM | POA: Diagnosis not present

## 2023-02-07 DIAGNOSIS — D500 Iron deficiency anemia secondary to blood loss (chronic): Secondary | ICD-10-CM | POA: Diagnosis not present

## 2023-02-07 DIAGNOSIS — I471 Supraventricular tachycardia, unspecified: Secondary | ICD-10-CM | POA: Diagnosis not present

## 2023-02-07 DIAGNOSIS — E7849 Other hyperlipidemia: Secondary | ICD-10-CM | POA: Diagnosis not present

## 2023-02-10 DIAGNOSIS — I5032 Chronic diastolic (congestive) heart failure: Secondary | ICD-10-CM | POA: Diagnosis not present

## 2023-02-10 DIAGNOSIS — E1122 Type 2 diabetes mellitus with diabetic chronic kidney disease: Secondary | ICD-10-CM | POA: Diagnosis not present

## 2023-02-14 DIAGNOSIS — G4733 Obstructive sleep apnea (adult) (pediatric): Secondary | ICD-10-CM | POA: Diagnosis not present

## 2023-02-15 DIAGNOSIS — H524 Presbyopia: Secondary | ICD-10-CM | POA: Diagnosis not present

## 2023-02-15 DIAGNOSIS — H35033 Hypertensive retinopathy, bilateral: Secondary | ICD-10-CM | POA: Diagnosis not present

## 2023-02-23 DIAGNOSIS — G4733 Obstructive sleep apnea (adult) (pediatric): Secondary | ICD-10-CM | POA: Diagnosis not present

## 2023-03-16 DIAGNOSIS — G4733 Obstructive sleep apnea (adult) (pediatric): Secondary | ICD-10-CM | POA: Diagnosis not present

## 2023-03-31 DIAGNOSIS — G4733 Obstructive sleep apnea (adult) (pediatric): Secondary | ICD-10-CM | POA: Diagnosis not present

## 2023-03-31 DIAGNOSIS — B356 Tinea cruris: Secondary | ICD-10-CM | POA: Diagnosis not present

## 2023-04-11 DIAGNOSIS — G4733 Obstructive sleep apnea (adult) (pediatric): Secondary | ICD-10-CM | POA: Diagnosis not present

## 2023-04-16 DIAGNOSIS — G4733 Obstructive sleep apnea (adult) (pediatric): Secondary | ICD-10-CM | POA: Diagnosis not present

## 2023-04-22 ENCOUNTER — Other Ambulatory Visit: Payer: Self-pay | Admitting: Cardiology

## 2023-04-22 DIAGNOSIS — M9903 Segmental and somatic dysfunction of lumbar region: Secondary | ICD-10-CM | POA: Diagnosis not present

## 2023-04-22 DIAGNOSIS — S161XXA Strain of muscle, fascia and tendon at neck level, initial encounter: Secondary | ICD-10-CM | POA: Diagnosis not present

## 2023-04-22 DIAGNOSIS — S338XXA Sprain of other parts of lumbar spine and pelvis, initial encounter: Secondary | ICD-10-CM | POA: Diagnosis not present

## 2023-04-22 DIAGNOSIS — S134XXA Sprain of ligaments of cervical spine, initial encounter: Secondary | ICD-10-CM | POA: Diagnosis not present

## 2023-04-22 DIAGNOSIS — S233XXA Sprain of ligaments of thoracic spine, initial encounter: Secondary | ICD-10-CM | POA: Diagnosis not present

## 2023-04-22 DIAGNOSIS — M9901 Segmental and somatic dysfunction of cervical region: Secondary | ICD-10-CM | POA: Diagnosis not present

## 2023-04-22 DIAGNOSIS — R519 Headache, unspecified: Secondary | ICD-10-CM | POA: Diagnosis not present

## 2023-04-22 DIAGNOSIS — M9902 Segmental and somatic dysfunction of thoracic region: Secondary | ICD-10-CM | POA: Diagnosis not present

## 2023-04-26 DIAGNOSIS — S338XXA Sprain of other parts of lumbar spine and pelvis, initial encounter: Secondary | ICD-10-CM | POA: Diagnosis not present

## 2023-04-26 DIAGNOSIS — M9902 Segmental and somatic dysfunction of thoracic region: Secondary | ICD-10-CM | POA: Diagnosis not present

## 2023-04-26 DIAGNOSIS — M9903 Segmental and somatic dysfunction of lumbar region: Secondary | ICD-10-CM | POA: Diagnosis not present

## 2023-04-26 DIAGNOSIS — M9901 Segmental and somatic dysfunction of cervical region: Secondary | ICD-10-CM | POA: Diagnosis not present

## 2023-04-26 DIAGNOSIS — S134XXA Sprain of ligaments of cervical spine, initial encounter: Secondary | ICD-10-CM | POA: Diagnosis not present

## 2023-04-26 DIAGNOSIS — S233XXA Sprain of ligaments of thoracic spine, initial encounter: Secondary | ICD-10-CM | POA: Diagnosis not present

## 2023-04-28 DIAGNOSIS — S233XXA Sprain of ligaments of thoracic spine, initial encounter: Secondary | ICD-10-CM | POA: Diagnosis not present

## 2023-04-28 DIAGNOSIS — M9902 Segmental and somatic dysfunction of thoracic region: Secondary | ICD-10-CM | POA: Diagnosis not present

## 2023-04-28 DIAGNOSIS — M9903 Segmental and somatic dysfunction of lumbar region: Secondary | ICD-10-CM | POA: Diagnosis not present

## 2023-04-28 DIAGNOSIS — S134XXA Sprain of ligaments of cervical spine, initial encounter: Secondary | ICD-10-CM | POA: Diagnosis not present

## 2023-04-28 DIAGNOSIS — M9901 Segmental and somatic dysfunction of cervical region: Secondary | ICD-10-CM | POA: Diagnosis not present

## 2023-05-03 DIAGNOSIS — S233XXA Sprain of ligaments of thoracic spine, initial encounter: Secondary | ICD-10-CM | POA: Diagnosis not present

## 2023-05-03 DIAGNOSIS — M9902 Segmental and somatic dysfunction of thoracic region: Secondary | ICD-10-CM | POA: Diagnosis not present

## 2023-05-03 DIAGNOSIS — S338XXA Sprain of other parts of lumbar spine and pelvis, initial encounter: Secondary | ICD-10-CM | POA: Diagnosis not present

## 2023-05-03 DIAGNOSIS — S134XXA Sprain of ligaments of cervical spine, initial encounter: Secondary | ICD-10-CM | POA: Diagnosis not present

## 2023-05-03 DIAGNOSIS — M9903 Segmental and somatic dysfunction of lumbar region: Secondary | ICD-10-CM | POA: Diagnosis not present

## 2023-05-03 DIAGNOSIS — M9901 Segmental and somatic dysfunction of cervical region: Secondary | ICD-10-CM | POA: Diagnosis not present

## 2023-05-11 DIAGNOSIS — M9903 Segmental and somatic dysfunction of lumbar region: Secondary | ICD-10-CM | POA: Diagnosis not present

## 2023-05-11 DIAGNOSIS — S338XXA Sprain of other parts of lumbar spine and pelvis, initial encounter: Secondary | ICD-10-CM | POA: Diagnosis not present

## 2023-05-11 DIAGNOSIS — S233XXA Sprain of ligaments of thoracic spine, initial encounter: Secondary | ICD-10-CM | POA: Diagnosis not present

## 2023-05-11 DIAGNOSIS — S134XXA Sprain of ligaments of cervical spine, initial encounter: Secondary | ICD-10-CM | POA: Diagnosis not present

## 2023-05-11 DIAGNOSIS — M9902 Segmental and somatic dysfunction of thoracic region: Secondary | ICD-10-CM | POA: Diagnosis not present

## 2023-05-11 DIAGNOSIS — M9901 Segmental and somatic dysfunction of cervical region: Secondary | ICD-10-CM | POA: Diagnosis not present

## 2023-05-17 DIAGNOSIS — G4733 Obstructive sleep apnea (adult) (pediatric): Secondary | ICD-10-CM | POA: Diagnosis not present

## 2023-05-19 DIAGNOSIS — M9901 Segmental and somatic dysfunction of cervical region: Secondary | ICD-10-CM | POA: Diagnosis not present

## 2023-05-19 DIAGNOSIS — S233XXA Sprain of ligaments of thoracic spine, initial encounter: Secondary | ICD-10-CM | POA: Diagnosis not present

## 2023-05-19 DIAGNOSIS — M9902 Segmental and somatic dysfunction of thoracic region: Secondary | ICD-10-CM | POA: Diagnosis not present

## 2023-05-19 DIAGNOSIS — S134XXA Sprain of ligaments of cervical spine, initial encounter: Secondary | ICD-10-CM | POA: Diagnosis not present

## 2023-05-19 DIAGNOSIS — M9903 Segmental and somatic dysfunction of lumbar region: Secondary | ICD-10-CM | POA: Diagnosis not present

## 2023-05-19 DIAGNOSIS — S338XXA Sprain of other parts of lumbar spine and pelvis, initial encounter: Secondary | ICD-10-CM | POA: Diagnosis not present

## 2023-05-20 DIAGNOSIS — E870 Hyperosmolality and hypernatremia: Secondary | ICD-10-CM | POA: Diagnosis not present

## 2023-05-20 DIAGNOSIS — E1165 Type 2 diabetes mellitus with hyperglycemia: Secondary | ICD-10-CM | POA: Diagnosis not present

## 2023-05-25 DIAGNOSIS — Z0001 Encounter for general adult medical examination with abnormal findings: Secondary | ICD-10-CM | POA: Diagnosis not present

## 2023-05-25 DIAGNOSIS — G4733 Obstructive sleep apnea (adult) (pediatric): Secondary | ICD-10-CM | POA: Diagnosis not present

## 2023-05-25 DIAGNOSIS — E782 Mixed hyperlipidemia: Secondary | ICD-10-CM | POA: Diagnosis not present

## 2023-05-25 DIAGNOSIS — I5032 Chronic diastolic (congestive) heart failure: Secondary | ICD-10-CM | POA: Diagnosis not present

## 2023-05-25 DIAGNOSIS — Z23 Encounter for immunization: Secondary | ICD-10-CM | POA: Diagnosis not present

## 2023-05-25 DIAGNOSIS — D126 Benign neoplasm of colon, unspecified: Secondary | ICD-10-CM | POA: Diagnosis not present

## 2023-05-31 DIAGNOSIS — M9903 Segmental and somatic dysfunction of lumbar region: Secondary | ICD-10-CM | POA: Diagnosis not present

## 2023-05-31 DIAGNOSIS — S338XXA Sprain of other parts of lumbar spine and pelvis, initial encounter: Secondary | ICD-10-CM | POA: Diagnosis not present

## 2023-05-31 DIAGNOSIS — S134XXA Sprain of ligaments of cervical spine, initial encounter: Secondary | ICD-10-CM | POA: Diagnosis not present

## 2023-05-31 DIAGNOSIS — H6122 Impacted cerumen, left ear: Secondary | ICD-10-CM | POA: Diagnosis not present

## 2023-05-31 DIAGNOSIS — S233XXA Sprain of ligaments of thoracic spine, initial encounter: Secondary | ICD-10-CM | POA: Diagnosis not present

## 2023-05-31 DIAGNOSIS — M9901 Segmental and somatic dysfunction of cervical region: Secondary | ICD-10-CM | POA: Diagnosis not present

## 2023-05-31 DIAGNOSIS — M9902 Segmental and somatic dysfunction of thoracic region: Secondary | ICD-10-CM | POA: Diagnosis not present

## 2023-06-07 DIAGNOSIS — M9901 Segmental and somatic dysfunction of cervical region: Secondary | ICD-10-CM | POA: Diagnosis not present

## 2023-06-07 DIAGNOSIS — S338XXA Sprain of other parts of lumbar spine and pelvis, initial encounter: Secondary | ICD-10-CM | POA: Diagnosis not present

## 2023-06-07 DIAGNOSIS — M9903 Segmental and somatic dysfunction of lumbar region: Secondary | ICD-10-CM | POA: Diagnosis not present

## 2023-06-07 DIAGNOSIS — S233XXA Sprain of ligaments of thoracic spine, initial encounter: Secondary | ICD-10-CM | POA: Diagnosis not present

## 2023-06-07 DIAGNOSIS — S134XXA Sprain of ligaments of cervical spine, initial encounter: Secondary | ICD-10-CM | POA: Diagnosis not present

## 2023-06-07 DIAGNOSIS — M9902 Segmental and somatic dysfunction of thoracic region: Secondary | ICD-10-CM | POA: Diagnosis not present

## 2023-06-14 DIAGNOSIS — G4733 Obstructive sleep apnea (adult) (pediatric): Secondary | ICD-10-CM | POA: Diagnosis not present

## 2023-06-21 DIAGNOSIS — M9902 Segmental and somatic dysfunction of thoracic region: Secondary | ICD-10-CM | POA: Diagnosis not present

## 2023-06-21 DIAGNOSIS — S338XXA Sprain of other parts of lumbar spine and pelvis, initial encounter: Secondary | ICD-10-CM | POA: Diagnosis not present

## 2023-06-21 DIAGNOSIS — M9903 Segmental and somatic dysfunction of lumbar region: Secondary | ICD-10-CM | POA: Diagnosis not present

## 2023-06-21 DIAGNOSIS — S134XXA Sprain of ligaments of cervical spine, initial encounter: Secondary | ICD-10-CM | POA: Diagnosis not present

## 2023-06-21 DIAGNOSIS — S233XXA Sprain of ligaments of thoracic spine, initial encounter: Secondary | ICD-10-CM | POA: Diagnosis not present

## 2023-06-21 DIAGNOSIS — M9901 Segmental and somatic dysfunction of cervical region: Secondary | ICD-10-CM | POA: Diagnosis not present

## 2023-06-29 DIAGNOSIS — L570 Actinic keratosis: Secondary | ICD-10-CM | POA: Diagnosis not present

## 2023-06-29 DIAGNOSIS — B078 Other viral warts: Secondary | ICD-10-CM | POA: Diagnosis not present

## 2023-06-29 DIAGNOSIS — Z85820 Personal history of malignant melanoma of skin: Secondary | ICD-10-CM | POA: Diagnosis not present

## 2023-06-29 DIAGNOSIS — D225 Melanocytic nevi of trunk: Secondary | ICD-10-CM | POA: Diagnosis not present

## 2023-06-29 DIAGNOSIS — X32XXXD Exposure to sunlight, subsequent encounter: Secondary | ICD-10-CM | POA: Diagnosis not present

## 2023-06-29 DIAGNOSIS — Z08 Encounter for follow-up examination after completed treatment for malignant neoplasm: Secondary | ICD-10-CM | POA: Diagnosis not present

## 2023-06-29 DIAGNOSIS — Z1283 Encounter for screening for malignant neoplasm of skin: Secondary | ICD-10-CM | POA: Diagnosis not present

## 2023-07-06 DIAGNOSIS — M9903 Segmental and somatic dysfunction of lumbar region: Secondary | ICD-10-CM | POA: Diagnosis not present

## 2023-07-06 DIAGNOSIS — M9901 Segmental and somatic dysfunction of cervical region: Secondary | ICD-10-CM | POA: Diagnosis not present

## 2023-07-06 DIAGNOSIS — M9902 Segmental and somatic dysfunction of thoracic region: Secondary | ICD-10-CM | POA: Diagnosis not present

## 2023-07-06 DIAGNOSIS — S134XXA Sprain of ligaments of cervical spine, initial encounter: Secondary | ICD-10-CM | POA: Diagnosis not present

## 2023-07-06 DIAGNOSIS — S338XXA Sprain of other parts of lumbar spine and pelvis, initial encounter: Secondary | ICD-10-CM | POA: Diagnosis not present

## 2023-07-06 DIAGNOSIS — S233XXA Sprain of ligaments of thoracic spine, initial encounter: Secondary | ICD-10-CM | POA: Diagnosis not present

## 2023-07-08 DIAGNOSIS — G4733 Obstructive sleep apnea (adult) (pediatric): Secondary | ICD-10-CM | POA: Diagnosis not present

## 2023-07-15 DIAGNOSIS — G4733 Obstructive sleep apnea (adult) (pediatric): Secondary | ICD-10-CM | POA: Diagnosis not present

## 2023-07-20 ENCOUNTER — Encounter: Payer: Self-pay | Admitting: Gastroenterology

## 2023-07-25 DIAGNOSIS — G4733 Obstructive sleep apnea (adult) (pediatric): Secondary | ICD-10-CM | POA: Diagnosis not present

## 2023-08-01 DIAGNOSIS — J069 Acute upper respiratory infection, unspecified: Secondary | ICD-10-CM | POA: Diagnosis not present

## 2023-08-01 DIAGNOSIS — R051 Acute cough: Secondary | ICD-10-CM | POA: Diagnosis not present

## 2023-08-14 DIAGNOSIS — G4733 Obstructive sleep apnea (adult) (pediatric): Secondary | ICD-10-CM | POA: Diagnosis not present

## 2023-08-23 DIAGNOSIS — M9902 Segmental and somatic dysfunction of thoracic region: Secondary | ICD-10-CM | POA: Diagnosis not present

## 2023-08-23 DIAGNOSIS — M9903 Segmental and somatic dysfunction of lumbar region: Secondary | ICD-10-CM | POA: Diagnosis not present

## 2023-08-23 DIAGNOSIS — S134XXA Sprain of ligaments of cervical spine, initial encounter: Secondary | ICD-10-CM | POA: Diagnosis not present

## 2023-08-23 DIAGNOSIS — M9901 Segmental and somatic dysfunction of cervical region: Secondary | ICD-10-CM | POA: Diagnosis not present

## 2023-08-23 DIAGNOSIS — S233XXA Sprain of ligaments of thoracic spine, initial encounter: Secondary | ICD-10-CM | POA: Diagnosis not present

## 2023-08-23 DIAGNOSIS — S338XXA Sprain of other parts of lumbar spine and pelvis, initial encounter: Secondary | ICD-10-CM | POA: Diagnosis not present

## 2023-09-14 DIAGNOSIS — G4733 Obstructive sleep apnea (adult) (pediatric): Secondary | ICD-10-CM | POA: Diagnosis not present

## 2023-09-15 DIAGNOSIS — Z1231 Encounter for screening mammogram for malignant neoplasm of breast: Secondary | ICD-10-CM | POA: Diagnosis not present

## 2023-09-27 DIAGNOSIS — G4733 Obstructive sleep apnea (adult) (pediatric): Secondary | ICD-10-CM | POA: Diagnosis not present

## 2023-10-14 DIAGNOSIS — G4733 Obstructive sleep apnea (adult) (pediatric): Secondary | ICD-10-CM | POA: Diagnosis not present

## 2023-10-25 DIAGNOSIS — M25511 Pain in right shoulder: Secondary | ICD-10-CM | POA: Diagnosis not present

## 2023-11-11 DIAGNOSIS — E1165 Type 2 diabetes mellitus with hyperglycemia: Secondary | ICD-10-CM | POA: Diagnosis not present

## 2023-11-11 DIAGNOSIS — E7849 Other hyperlipidemia: Secondary | ICD-10-CM | POA: Diagnosis not present

## 2023-11-11 DIAGNOSIS — Z0001 Encounter for general adult medical examination with abnormal findings: Secondary | ICD-10-CM | POA: Diagnosis not present

## 2023-11-11 DIAGNOSIS — E875 Hyperkalemia: Secondary | ICD-10-CM | POA: Diagnosis not present

## 2023-11-15 DIAGNOSIS — I5032 Chronic diastolic (congestive) heart failure: Secondary | ICD-10-CM | POA: Diagnosis not present

## 2023-11-15 DIAGNOSIS — I10 Essential (primary) hypertension: Secondary | ICD-10-CM | POA: Diagnosis not present

## 2023-11-15 DIAGNOSIS — M19011 Primary osteoarthritis, right shoulder: Secondary | ICD-10-CM | POA: Diagnosis not present

## 2023-11-15 DIAGNOSIS — E1165 Type 2 diabetes mellitus with hyperglycemia: Secondary | ICD-10-CM | POA: Diagnosis not present

## 2023-12-19 ENCOUNTER — Encounter (INDEPENDENT_AMBULATORY_CARE_PROVIDER_SITE_OTHER): Payer: Medicare Other | Admitting: Ophthalmology

## 2023-12-19 DIAGNOSIS — H35033 Hypertensive retinopathy, bilateral: Secondary | ICD-10-CM | POA: Diagnosis not present

## 2023-12-19 DIAGNOSIS — I10 Essential (primary) hypertension: Secondary | ICD-10-CM

## 2023-12-19 DIAGNOSIS — H43813 Vitreous degeneration, bilateral: Secondary | ICD-10-CM | POA: Diagnosis not present

## 2023-12-19 DIAGNOSIS — H353132 Nonexudative age-related macular degeneration, bilateral, intermediate dry stage: Secondary | ICD-10-CM | POA: Diagnosis not present

## 2023-12-19 DIAGNOSIS — D3132 Benign neoplasm of left choroid: Secondary | ICD-10-CM

## 2023-12-19 DIAGNOSIS — D3131 Benign neoplasm of right choroid: Secondary | ICD-10-CM

## 2023-12-31 DIAGNOSIS — M25552 Pain in left hip: Secondary | ICD-10-CM | POA: Diagnosis not present

## 2024-01-09 DIAGNOSIS — M51362 Other intervertebral disc degeneration, lumbar region with discogenic back pain and lower extremity pain: Secondary | ICD-10-CM | POA: Diagnosis not present

## 2024-01-09 DIAGNOSIS — M160 Bilateral primary osteoarthritis of hip: Secondary | ICD-10-CM | POA: Diagnosis not present

## 2024-01-17 DIAGNOSIS — Z23 Encounter for immunization: Secondary | ICD-10-CM | POA: Diagnosis not present

## 2024-01-19 DIAGNOSIS — I5032 Chronic diastolic (congestive) heart failure: Secondary | ICD-10-CM | POA: Diagnosis not present

## 2024-01-19 DIAGNOSIS — D509 Iron deficiency anemia, unspecified: Secondary | ICD-10-CM | POA: Diagnosis not present

## 2024-01-19 DIAGNOSIS — I471 Supraventricular tachycardia, unspecified: Secondary | ICD-10-CM | POA: Diagnosis not present

## 2024-01-19 DIAGNOSIS — M160 Bilateral primary osteoarthritis of hip: Secondary | ICD-10-CM | POA: Diagnosis not present

## 2024-01-19 DIAGNOSIS — G4733 Obstructive sleep apnea (adult) (pediatric): Secondary | ICD-10-CM | POA: Diagnosis not present

## 2024-01-19 DIAGNOSIS — J309 Allergic rhinitis, unspecified: Secondary | ICD-10-CM | POA: Diagnosis not present

## 2024-01-19 DIAGNOSIS — K219 Gastro-esophageal reflux disease without esophagitis: Secondary | ICD-10-CM | POA: Diagnosis not present

## 2024-01-19 DIAGNOSIS — I10 Essential (primary) hypertension: Secondary | ICD-10-CM | POA: Diagnosis not present

## 2024-01-19 DIAGNOSIS — E782 Mixed hyperlipidemia: Secondary | ICD-10-CM | POA: Diagnosis not present

## 2024-01-19 DIAGNOSIS — E1165 Type 2 diabetes mellitus with hyperglycemia: Secondary | ICD-10-CM | POA: Diagnosis not present

## 2024-03-12 ENCOUNTER — Encounter (HOSPITAL_COMMUNITY): Payer: Self-pay | Admitting: Oncology

## 2024-03-19 ENCOUNTER — Ambulatory Visit: Payer: Self-pay | Admitting: Cardiology

## 2024-03-21 ENCOUNTER — Other Ambulatory Visit (HOSPITAL_BASED_OUTPATIENT_CLINIC_OR_DEPARTMENT_OTHER): Payer: Self-pay

## 2024-03-21 ENCOUNTER — Encounter (HOSPITAL_COMMUNITY): Payer: Self-pay | Admitting: Oncology

## 2024-03-21 ENCOUNTER — Other Ambulatory Visit (HOSPITAL_COMMUNITY): Payer: Self-pay

## 2024-03-22 ENCOUNTER — Other Ambulatory Visit (HOSPITAL_COMMUNITY): Payer: Self-pay

## 2024-03-22 ENCOUNTER — Other Ambulatory Visit: Payer: Self-pay

## 2024-03-22 MED ORDER — INSULIN GLARGINE 100 UNIT/ML SOLOSTAR PEN
20.0000 [IU] | PEN_INJECTOR | Freq: Every day | SUBCUTANEOUS | 5 refills | Status: AC
  Filled 2024-03-22: qty 18, 90d supply, fill #0

## 2024-03-22 MED ORDER — DILTIAZEM HCL ER 240 MG PO TB24
240.0000 mg | ORAL_TABLET | Freq: Every day | ORAL | 3 refills | Status: AC
  Filled 2024-03-22: qty 90, 90d supply, fill #0

## 2024-03-22 MED ORDER — IRBESARTAN 150 MG PO TABS
150.0000 mg | ORAL_TABLET | Freq: Every day | ORAL | 3 refills | Status: AC
  Filled 2024-03-22: qty 100, 100d supply, fill #0

## 2024-03-22 MED FILL — Metformin HCl Tab 1000 MG: 1000.0000 mg | ORAL | 100 days supply | Qty: 200 | Fill #0 | Status: AC

## 2024-03-22 MED FILL — Furosemide Tab 40 MG: 40.0000 mg | ORAL | 100 days supply | Qty: 200 | Fill #0 | Status: AC

## 2024-03-22 MED FILL — Atorvastatin Calcium Tab 40 MG (Base Equivalent): 40.0000 mg | ORAL | 100 days supply | Qty: 100 | Fill #0 | Status: AC

## 2024-03-22 MED FILL — Omeprazole Cap Delayed Release 20 MG: 20.0000 mg | ORAL | 100 days supply | Qty: 100 | Fill #0 | Status: AC

## 2024-03-29 ENCOUNTER — Other Ambulatory Visit (HOSPITAL_COMMUNITY): Payer: Self-pay

## 2024-05-21 ENCOUNTER — Ambulatory Visit: Payer: Self-pay | Admitting: Cardiology

## 2024-12-24 ENCOUNTER — Encounter (INDEPENDENT_AMBULATORY_CARE_PROVIDER_SITE_OTHER): Admitting: Ophthalmology
